# Patient Record
Sex: Male | Born: 1955
Health system: Southern US, Community
[De-identification: ages and names within clinical notes are randomized; demographics above are authoritative.]

## PROBLEM LIST (undated history)

## (undated) DIAGNOSIS — Z972 Presence of dental prosthetic device (complete) (partial): Secondary | ICD-10-CM

## (undated) DIAGNOSIS — I10 Essential (primary) hypertension: Secondary | ICD-10-CM

## (undated) DIAGNOSIS — M1990 Unspecified osteoarthritis, unspecified site: Secondary | ICD-10-CM

## (undated) DIAGNOSIS — E119 Type 2 diabetes mellitus without complications: Secondary | ICD-10-CM

## (undated) DIAGNOSIS — E785 Hyperlipidemia, unspecified: Secondary | ICD-10-CM

## (undated) DIAGNOSIS — S22000A Wedge compression fracture of unspecified thoracic vertebra, initial encounter for closed fracture: Secondary | ICD-10-CM

## (undated) HISTORY — DX: Essential (primary) hypertension: I10

## (undated) HISTORY — DX: Type 2 diabetes mellitus without complications: E11.9

## (undated) HISTORY — PX: COLONOSCOPY: SHX174

## (undated) HISTORY — DX: Hyperlipidemia, unspecified: E78.5

## (undated) HISTORY — PX: EYE SURGERY: SHX253

## (undated) HISTORY — DX: Wedge compression fracture of unspecified thoracic vertebra, initial encounter for closed fracture: S22.000A

---

## 2006-05-23 ENCOUNTER — Ambulatory Visit: Payer: Self-pay | Admitting: Gastroenterology

## 2006-06-29 ENCOUNTER — Ambulatory Visit: Payer: Self-pay | Admitting: Gastroenterology

## 2015-06-23 ENCOUNTER — Ambulatory Visit (INDEPENDENT_AMBULATORY_CARE_PROVIDER_SITE_OTHER): Payer: BLUE CROSS/BLUE SHIELD | Admitting: Family Medicine

## 2015-06-23 ENCOUNTER — Encounter: Payer: Self-pay | Admitting: Family Medicine

## 2015-06-23 VITALS — BP 120/70 | HR 100 | Temp 98.7°F | Resp 16 | Ht 65.0 in | Wt 146.5 lb

## 2015-06-23 DIAGNOSIS — H5440 Blindness, one eye, unspecified eye: Secondary | ICD-10-CM | POA: Insufficient documentation

## 2015-06-23 DIAGNOSIS — IMO0002 Reserved for concepts with insufficient information to code with codable children: Secondary | ICD-10-CM | POA: Insufficient documentation

## 2015-06-23 DIAGNOSIS — E1165 Type 2 diabetes mellitus with hyperglycemia: Secondary | ICD-10-CM | POA: Insufficient documentation

## 2015-06-23 DIAGNOSIS — E119 Type 2 diabetes mellitus without complications: Secondary | ICD-10-CM

## 2015-06-23 DIAGNOSIS — I152 Hypertension secondary to endocrine disorders: Secondary | ICD-10-CM | POA: Insufficient documentation

## 2015-06-23 DIAGNOSIS — H5442 Blindness, left eye, normal vision right eye: Secondary | ICD-10-CM | POA: Diagnosis not present

## 2015-06-23 DIAGNOSIS — E785 Hyperlipidemia, unspecified: Secondary | ICD-10-CM | POA: Insufficient documentation

## 2015-06-23 DIAGNOSIS — I10 Essential (primary) hypertension: Secondary | ICD-10-CM | POA: Diagnosis not present

## 2015-06-23 DIAGNOSIS — E1159 Type 2 diabetes mellitus with other circulatory complications: Secondary | ICD-10-CM | POA: Insufficient documentation

## 2015-06-23 LAB — POCT UA - MICROALBUMIN: Microalbumin Ur, POC: 0 mg/L

## 2015-06-23 LAB — POCT GLYCOSYLATED HEMOGLOBIN (HGB A1C): Hemoglobin A1C: 6.5

## 2015-06-23 LAB — GLUCOSE, POCT (MANUAL RESULT ENTRY): POC Glucose: 85 mg/dl (ref 70–99)

## 2015-06-23 NOTE — Progress Notes (Signed)
Name: John Howard   MRN: [id number]    DOB: January 27, 1956   Date:06/23/2015       Progress Note  Subjective  Chief Complaint  Chief Complaint  Patient presents with  . Diabetes  . Hypertension  . Hyperlipidemia    Diabetes He presents for his follow-up diabetic visit. He has type 2 diabetes mellitus. His disease course has been improving. There are no hypoglycemic associated symptoms. Pertinent negatives for hypoglycemia include no dizziness, headaches, nervousness/anxiousness, seizures or tremors. Pertinent negatives for diabetes include no blurred vision, no chest pain, no weakness and no weight loss. Risk factors for coronary artery disease include diabetes mellitus, dyslipidemia, hypertension, male sex, sedentary lifestyle and tobacco exposure. Current diabetic treatment includes oral agent (monotherapy) and insulin injections. He is compliant with treatment all of the time. His weight is decreasing steadily. He is following a diabetic diet. His home blood glucose trend is increasing steadily. His breakfast blood glucose range is generally 70-90 mg/dl.  Hypertension This is a chronic problem. The current episode started more than 1 year ago. The problem is unchanged. The problem is controlled. Pertinent negatives include no blurred vision, chest pain, headaches, neck pain, orthopnea, palpitations or shortness of breath. There are no associated agents to hypertension. Past treatments include ACE inhibitors. There are no compliance problems.   Hyperlipidemia This is a chronic problem. The current episode started more than 1 year ago. The problem is controlled. Recent lipid tests were reviewed and are normal. Pertinent negatives include no chest pain, focal weakness, myalgias or shortness of breath. Current antihyperlipidemic treatment includes statins. There are no compliance problems.    Blindness of left eye subjective patient suffered an injury to his left and she was  subsequently Continue   Past Medical History  Diagnosis Date  . Hypertension   . Hyperlipidemia   . Depression     History  Substance Use Topics  . Smoking status: Current Some Day Smoker  . Smokeless tobacco: Never Used  . Alcohol Use: 0.0 oz/week    0 Standard drinks or equivalent per week     Current outpatient prescriptions:  .  BAYER CONTOUR TEST test strip, , Disp: , Rfl: 1 .  LEVEMIR FLEXTOUCH 100 UNIT/ML Pen, , Disp: , Rfl: 0 .  lisinopril (PRINIVIL,ZESTRIL) 20 MG tablet, , Disp: , Rfl: 0 .  lovastatin (MEVACOR) 20 MG tablet, , Disp: , Rfl: 0 .  metFORMIN (GLUCOPHAGE) 500 MG tablet, , Disp: , Rfl: 0 .  NOVOFINE 32G X 6 MM MISC, , Disp: , Rfl: 0  No Known Allergies  Review of Systems  Constitutional: Negative for fever, chills and weight loss.  HENT: Negative for congestion, hearing loss, sore throat and tinnitus.   Eyes: Negative for blurred vision, double vision and redness.  Respiratory: Negative for cough, hemoptysis and shortness of breath.   Cardiovascular: Negative for chest pain, palpitations, orthopnea, claudication and leg swelling.  Gastrointestinal: Negative for heartburn, nausea, vomiting, diarrhea, constipation and blood in stool.  Genitourinary: Negative for dysuria, urgency, frequency and hematuria.  Musculoskeletal: Negative for myalgias, back pain, joint pain, falls and neck pain.  Skin: Negative for itching.  Neurological: Negative for dizziness, tingling, tremors, focal weakness, seizures, loss of consciousness, weakness and headaches.  Endo/Heme/Allergies: Does not bruise/bleed easily.  Psychiatric/Behavioral: Negative for depression and substance abuse. The patient is not nervous/anxious and does not have insomnia.      Objective  Filed Vitals:   06/23/15 0803  BP: 120/70  Pulse: 100  Temp: 98.7 F (37.1 C)  TempSrc: Oral  Resp: 16  Height:  (1.651 m)  Weight: 146 lb 8 oz (66.452 kg)  SpO2: 96%     Physical Exam   Constitutional: He is oriented to person, place, and time and well-developed, well-nourished, and in no distress.  HENT:  Head: Normocephalic.  Eyes:   Left eye is blind  Neck: Normal range of motion. Neck supple. No thyromegaly present.  Cardiovascular: Normal rate, regular rhythm and normal heart sounds.   No murmur heard. Pulmonary/Chest: Effort normal and breath sounds normal. No respiratory distress. He has no wheezes.  Abdominal: Soft. Bowel sounds are normal.  Musculoskeletal: Normal range of motion. He exhibits no edema.  Lymphadenopathy:    He has no cervical adenopathy.  Neurological: He is alert and oriented to person, place, and time. No cranial nerve deficit. Gait normal. Coordination normal.  Skin: Skin is warm and dry. No rash noted.  Psychiatric: Affect and judgment normal.      Assessment & Plan  1. Type 2 diabetes mellitus without complication Remains well controlled well-controlled - POCT HgB A1C - POCT Glucose (CBG) - POCT UA - Microalbumin - Comprehensive metabolic panel  2. Essential hypertension - TSH  3. Hyperlipidemia Recheck labs today - Lipid panel  4. Blind left eye A long-standing since childhood and being followed by ophthalmologist

## 2015-06-24 LAB — LIPID PANEL
Chol/HDL Ratio: 2.3 ratio units (ref 0.0–5.0)
Cholesterol, Total: 131 mg/dL (ref 100–199)
HDL: 56 mg/dL (ref >39)
LDL Calculated: 63 mg/dL (ref 0–99)
Triglycerides: 60 mg/dL (ref 0–149)
VLDL Cholesterol Cal: 12 mg/dL (ref 5–40)

## 2015-06-24 LAB — COMPREHENSIVE METABOLIC PANEL
ALT: 21 IU/L (ref 0–44)
AST: 23 IU/L (ref 0–40)
Albumin/Globulin Ratio: 1.8 (ref 1.1–2.5)
Albumin: 4.8 g/dL (ref 3.5–5.5)
Alkaline Phosphatase: 61 IU/L (ref 39–117)
BUN/Creatinine Ratio: 12 (ref 9–20)
BUN: 13 mg/dL (ref 6–24)
Bilirubin Total: 0.4 mg/dL (ref 0.0–1.2)
CO2: 23 mmol/L (ref 18–29)
Calcium: 9.6 mg/dL (ref 8.7–10.2)
Chloride: 99 mmol/L (ref 97–108)
Creatinine, Ser: 1.07 mg/dL (ref 0.76–1.27)
GFR calc Af Amer: 88 mL/min/{1.73_m2} (ref >59)
GFR calc non Af Amer: 76 mL/min/{1.73_m2} (ref >59)
Globulin, Total: 2.6 g/dL (ref 1.5–4.5)
Glucose: 87 mg/dL (ref 65–99)
Potassium: 4.7 mmol/L (ref 3.5–5.2)
Sodium: 139 mmol/L (ref 134–144)
Total Protein: 7.4 g/dL (ref 6.0–8.5)

## 2015-06-24 LAB — TSH: TSH: 1.84 u[IU]/mL (ref 0.450–4.500)

## 2015-09-04 ENCOUNTER — Other Ambulatory Visit: Payer: Self-pay | Admitting: Family Medicine

## 2015-09-14 ENCOUNTER — Other Ambulatory Visit: Payer: Self-pay | Admitting: Family Medicine

## 2015-09-23 ENCOUNTER — Encounter: Payer: Self-pay | Admitting: Family Medicine

## 2015-09-23 ENCOUNTER — Ambulatory Visit (INDEPENDENT_AMBULATORY_CARE_PROVIDER_SITE_OTHER): Payer: BLUE CROSS/BLUE SHIELD | Admitting: Family Medicine

## 2015-09-23 VITALS — BP 120/80 | HR 76 | Temp 98.5°F | Resp 16 | Ht 65.0 in | Wt 145.5 lb

## 2015-09-23 DIAGNOSIS — E785 Hyperlipidemia, unspecified: Secondary | ICD-10-CM | POA: Diagnosis not present

## 2015-09-23 DIAGNOSIS — E119 Type 2 diabetes mellitus without complications: Secondary | ICD-10-CM

## 2015-09-23 DIAGNOSIS — I10 Essential (primary) hypertension: Secondary | ICD-10-CM | POA: Diagnosis not present

## 2015-09-23 DIAGNOSIS — Z23 Encounter for immunization: Secondary | ICD-10-CM | POA: Diagnosis not present

## 2015-09-23 MED ORDER — LOVASTATIN 20 MG PO TABS: 20.0000 mg | ORAL_TABLET | Freq: Every day | ORAL | Status: DC

## 2015-09-23 NOTE — Progress Notes (Signed)
Name: John Howard   MRN: [id number]    DOB: [birth year]   Date:09/23/2015       Progress Note  Subjective  Chief Complaint  Chief Complaint  Patient presents with  . Hypertension    4 month follow up  . Diabetes  . Hyperlipidemia    HPI  Diabetes  Patient presents for follow-up of diabetes which is present for over 5 years. Is currently on a regimen of levemer. Patient states compliant with their diet and exercise. There's been no hypoglycemic episodes and there no polyuria polydipsia polyphagia. His average fasting glucoses been in the low around 80-120 with a high around 120 . There is no end organ disease.  Last diabetic eye exam was less than one year ago.   Last visit with dietitian was over 1 year ago. Last microalbumin was obtained earlier this year .   Hyperlipidemia  Patient has a history of hyperlipidemia for over 5 years.  Current medical regimen consist of lovastatin 20 mg daily at bedtime .  Compliance is good .  Diet and exercise are currently followed low-fat .  Risk factors for cardiovascular disease include hyperlipidemia diabetes .   There have been no side effects from the medication.    Hypertension   Patient presents for follow-up of hypertension. It has been present for over 5 years.  Patient states that there is compliance with medical regimen which consists of lisinopril 20 mg daily . There is no end organ disease. Cardiac risk factors include hypertension hyperlipidemia and diabetes.  Exercise regimen consist of walking .  Diet consist of restricted salt .    Past Medical History  Diagnosis Date  . Hypertension   . Hyperlipidemia   . Depression     Social History  Substance Use Topics  . Smoking status: Current Some Day Smoker  . Smokeless tobacco: Never Used  . Alcohol Use: 0.0 oz/week    0 Standard drinks or equivalent per week     Current outpatient prescriptions:  .  BAYER CONTOUR TEST test strip, TEST twice a day, Disp: 180 each, Rfl:  3 .  LEVEMIR FLEXTOUCH 100 UNIT/ML Pen, , Disp: , Rfl: 0 .  lisinopril (PRINIVIL,ZESTRIL) 20 MG tablet, , Disp: , Rfl: 0 .  lovastatin (MEVACOR) 20 MG tablet, , Disp: , Rfl: 0 .  metFORMIN (GLUCOPHAGE) 500 MG tablet, , Disp: , Rfl: 0 .  MICROLET LANCETS MISC, TEST BLOOD SUGAR twice a day or as directed by prescriber, Disp: 100 each, Rfl: 11 .  NOVOFINE 32G X 6 MM MISC, use 1 twice a day, Disp: 100 each, Rfl: 3  No Known Allergies  Review of Systems  Constitutional: Negative for fever, chills and weight loss.  HENT: Negative for congestion, hearing loss, sore throat and tinnitus.   Eyes: Negative for blurred vision, double vision and redness.       Blindness of left eye from traumatic injury  Respiratory: Negative for cough, hemoptysis and shortness of breath.   Cardiovascular: Negative for chest pain, palpitations, orthopnea, claudication and leg swelling.  Gastrointestinal: Negative for heartburn, nausea, vomiting, diarrhea, constipation and blood in stool.  Genitourinary: Negative for dysuria, urgency, frequency and hematuria.  Musculoskeletal: Negative for myalgias, back pain, joint pain, falls and neck pain.  Skin: Negative for itching.  Neurological: Negative for dizziness, tingling, tremors, focal weakness, seizures, loss of consciousness, weakness and headaches.  Endo/Heme/Allergies: Does not bruise/bleed easily.  Psychiatric/Behavioral: Negative for depression and substance abuse. The patient is not nervous/anxious  and does not have insomnia.      Objective  Filed Vitals:   09/23/15 0804  BP: 120/80  Pulse: 76  Temp: 98.5 F (36.9 C)  TempSrc: Oral  Resp: 16  Height: 5\' 5"  (1.651 m)  Weight: 145 lb 8 oz (65.998 kg)  SpO2: 98%     Physical Exam  Constitutional: He is oriented to person, place, and time and well-developed, well-nourished, and in no distress.  HENT:  Head: Normocephalic.  Eyes: EOM are normal. Pupils are equal, round, and reactive to light.  1  left eye opacity  Neck: Normal range of motion. Neck supple. No thyromegaly present.  Cardiovascular: Normal rate, regular rhythm, normal heart sounds and intact distal pulses.   No murmur heard. Pulmonary/Chest: Effort normal and breath sounds normal. No respiratory distress. He has no wheezes.  Abdominal: Soft. Bowel sounds are normal.  Musculoskeletal: Normal range of motion. He exhibits no edema.  Lymphadenopathy:    He has no cervical adenopathy.  Neurological: He is alert and oriented to person, place, and time. No cranial nerve deficit. Gait normal. Coordination normal.  Skin: Skin is warm and dry. No rash noted.  Psychiatric: Affect and judgment normal.      Assessment & Plan  1. Need for influenza vaccination Given today - Flu Vaccine QUAD 36+ mos PF IM (Fluarix & Fluzone Quad PF)  2. Type 2 diabetes mellitus without complication, without long-term current use of insulin (HCC) Well-controlled. Discontinue metformin  3. Hyperlipidemia Well-controlled  4. Essential hypertension Well-controlled

## 2015-09-24 ENCOUNTER — Other Ambulatory Visit: Payer: Self-pay

## 2015-09-24 ENCOUNTER — Ambulatory Visit: Payer: BLUE CROSS/BLUE SHIELD | Admitting: Family Medicine

## 2015-09-24 MED ORDER — LISINOPRIL 20 MG PO TABS: 20.0000 mg | ORAL_TABLET | Freq: Every day | ORAL | Status: DC

## 2015-11-25 ENCOUNTER — Ambulatory Visit (INDEPENDENT_AMBULATORY_CARE_PROVIDER_SITE_OTHER): Payer: BLUE CROSS/BLUE SHIELD | Admitting: Family Medicine

## 2015-11-25 ENCOUNTER — Encounter: Payer: Self-pay | Admitting: Family Medicine

## 2015-11-25 VITALS — BP 138/82 | HR 64 | Temp 98.6°F | Resp 16 | Ht 65.0 in | Wt 149.1 lb

## 2015-11-25 DIAGNOSIS — H5442 Blindness, left eye, normal vision right eye: Secondary | ICD-10-CM

## 2015-11-25 DIAGNOSIS — Z794 Long term (current) use of insulin: Secondary | ICD-10-CM

## 2015-11-25 DIAGNOSIS — I10 Essential (primary) hypertension: Secondary | ICD-10-CM | POA: Diagnosis not present

## 2015-11-25 DIAGNOSIS — E1169 Type 2 diabetes mellitus with other specified complication: Secondary | ICD-10-CM | POA: Diagnosis not present

## 2015-11-25 DIAGNOSIS — E785 Hyperlipidemia, unspecified: Secondary | ICD-10-CM | POA: Diagnosis not present

## 2015-11-25 DIAGNOSIS — E119 Type 2 diabetes mellitus without complications: Secondary | ICD-10-CM

## 2015-11-25 DIAGNOSIS — E1142 Type 2 diabetes mellitus with diabetic polyneuropathy: Secondary | ICD-10-CM | POA: Insufficient documentation

## 2015-11-25 DIAGNOSIS — H5440 Blindness, one eye, unspecified eye: Secondary | ICD-10-CM

## 2015-11-25 LAB — POCT GLYCOSYLATED HEMOGLOBIN (HGB A1C): Hemoglobin A1C: 7.2

## 2015-11-25 LAB — GLUCOSE, POCT (MANUAL RESULT ENTRY): POC Glucose: 128 mg/dl — AB (ref 70–99)

## 2015-11-25 NOTE — Progress Notes (Signed)
Name: John Howard   MRN: [id number]    DOB: 09-23-1956   Date:11/25/2015       Progress Note  Subjective  Chief Complaint  Chief Complaint  Patient presents with  . Hypertension    4 month recheck  . Diabetes  . Hyperlipidemia    HPI  Diabetes  Patient presents for follow-up of diabetes which is present for over 5 years. Is currently on a regimen of Levemir . Patient states not now has regularly compliant compliant with their diet and exercise. There's been no hypoglycemic episodes and there no polyuria polydipsia polyphagia. His average fasting glucoses been in the low around low 90s with a high around low 100s fasting exhibits some dietary discretion of recent . There is no end organ disease.  Last diabetic eye exam was this year.   Last visit with dietitian was last year. Last microalbumin was obtained earlier this year she was 0 .   Hypertension   Patient presents for follow-up of hypertension. It has been present for over over 5 years.  Patient states that there is compliance with medical regimen which consists of lisinopril 20 mg daily . There is no end organ disease. Cardiac risk factors include hypertension hyperlipidemia and diabetes.  Exercise regimen consist of some walking .  Diet consist of salt restriction .  Hyperlipidemia  Patient has a history of hyperlipidemia for over 5 years.  Current medical regimen consist of lovastatin 20 mg daily at bedtime .  Compliance is good currently well .  Diet and exercise are currently followed fairly well .  Risk factors for cardiovascular disease include hyperlipidemia hypertension and diabetes .   There have been no side effects from the medication.    Past Medical History  Diagnosis Date  . Hypertension   . Hyperlipidemia   . Depression     Social History  Substance Use Topics  . Smoking status: Current Some Day Smoker  . Smokeless tobacco: Never Used  . Alcohol Use: 0.0 oz/week    0 Standard drinks or equivalent per  week     Current outpatient prescriptions:  .  BAYER CONTOUR TEST test strip, TEST twice a day, Disp: 180 each, Rfl: 3 .  LEVEMIR FLEXTOUCH 100 UNIT/ML Pen, , Disp: , Rfl: 0 .  lisinopril (PRINIVIL,ZESTRIL) 20 MG tablet, Take 1 tablet (20 mg total) by mouth daily., Disp: 30 tablet, Rfl: 3 .  lovastatin (MEVACOR) 20 MG tablet, Take 1 tablet (20 mg total) by mouth at bedtime., Disp: 30 tablet, Rfl: 5 .  MICROLET LANCETS MISC, TEST BLOOD SUGAR twice a day or as directed by prescriber, Disp: 100 each, Rfl: 11 .  NOVOFINE 32G X 6 MM MISC, use 1 twice a day, Disp: 100 each, Rfl: 3  No Known Allergies  Review of Systems  Constitutional: Negative for fever, chills and weight loss.  HENT: Negative for congestion, hearing loss, sore throat and tinnitus.        Plan left eye  Eyes: Negative for blurred vision, double vision and redness.       Plan left eye  Respiratory: Negative for cough, hemoptysis and shortness of breath.   Cardiovascular: Negative for chest pain, palpitations, orthopnea, claudication and leg swelling.  Gastrointestinal: Negative for heartburn, nausea, vomiting, diarrhea, constipation and blood in stool.  Genitourinary: Negative for dysuria, urgency, frequency and hematuria.  Musculoskeletal: Negative for myalgias, back pain, joint pain, falls and neck pain.  Skin: Negative for itching.  Neurological: Negative for dizziness, tingling,  tremors, focal weakness, seizures, loss of consciousness, weakness and headaches.  Endo/Heme/Allergies: Does not bruise/bleed easily.  Psychiatric/Behavioral: Negative for depression and substance abuse. The patient is not nervous/anxious and does not have insomnia.      Objective  Filed Vitals:   11/25/15 0742  BP: 138/82  Pulse: 64  Temp: 98.6 F (37 C)  TempSrc: Oral  Resp: 16  Height: 5\' 5"  (1.651 m)  Weight: 149 lb 1.6 oz (67.631 kg)  SpO2: 98%     Physical Exam  Constitutional: He is oriented to person, place, and time and  well-developed, well-nourished, and in no distress.  HENT:  Head: Normocephalic.  Eyes: EOM are normal. Pupils are equal, round, and reactive to light.  Blind left eye  Neck: Normal range of motion. Neck supple. No thyromegaly present.  Cardiovascular: Normal rate, regular rhythm and normal heart sounds.   No murmur heard. Pulmonary/Chest: Effort normal and breath sounds normal. No respiratory distress. He has no wheezes.  Abdominal: Soft. Bowel sounds are normal.  Musculoskeletal: Normal range of motion. He exhibits no edema.  Lymphadenopathy:    He has no cervical adenopathy.  Neurological: He is alert and oriented to person, place, and time. No cranial nerve deficit. Gait normal. Coordination normal.  Skin: Skin is warm and dry. No rash noted.  Psychiatric: Affect and judgment normal.      Assessment & Plan   1. Type 2 diabetes mellitus with other specified complication (HCC) Encourage compliance with diet and exercises in class and increase his Levemir from 2142 units daily - POCT HgB A1C - POCT Glucose (CBG)  2. Essential hypertension Not at goal 130/80  3. Type 2 diabetes mellitus without complication, with long-term current use of insulin (HCC)   4. Hyperlipidemia Labs on return visit  5. Blind left eye He is to see ophthalmologist in the future

## 2015-12-24 ENCOUNTER — Encounter: Payer: BLUE CROSS/BLUE SHIELD | Admitting: Family Medicine

## 2016-01-13 ENCOUNTER — Other Ambulatory Visit: Payer: Self-pay | Admitting: Family Medicine

## 2016-01-19 ENCOUNTER — Other Ambulatory Visit: Payer: Self-pay | Admitting: Family Medicine

## 2016-01-19 MED ORDER — LEVEMIR FLEXTOUCH 100 UNIT/ML ~~LOC~~ SOPN: 22.0000 [IU] | PEN_INJECTOR | Freq: Every day | SUBCUTANEOUS | Status: DC

## 2016-02-04 ENCOUNTER — Encounter: Payer: BLUE CROSS/BLUE SHIELD | Admitting: Family Medicine

## 2016-02-22 ENCOUNTER — Encounter: Payer: Self-pay | Admitting: Family Medicine

## 2016-02-22 ENCOUNTER — Ambulatory Visit (INDEPENDENT_AMBULATORY_CARE_PROVIDER_SITE_OTHER): Payer: BLUE CROSS/BLUE SHIELD | Admitting: Family Medicine

## 2016-02-22 VITALS — BP 138/70 | HR 69 | Temp 98.8°F | Resp 15 | Ht 65.0 in | Wt 150.1 lb

## 2016-02-22 DIAGNOSIS — Z0000 Encounter for general adult medical examination without abnormal findings: Secondary | ICD-10-CM | POA: Insufficient documentation

## 2016-02-22 NOTE — Progress Notes (Signed)
Name: John Howard   MRN: [id number]    DOB: 04-11-1956   Date:02/22/2016       Progress Note  Subjective  Chief Complaint  Chief Complaint  Patient presents with  . Annual Exam    CPE    HPI  Pt. Is here for a Complete Physical Exam.  Last colonoscopy was reportedly 10 years ago, no records available but got a reminder from his [employer] that he is due for colonoscopy.   Past Medical History  Diagnosis Date  . Hypertension   . Hyperlipidemia   . Diabetes mellitus without complication [facility])     Past Surgical History  Procedure Laterality Date  . Eye surgery      Family History  Problem Relation Age of Onset  . Hypertension Mother   . Diabetes Mother     Social History   Social History  . Marital Status: Married    Spouse Name: N/A  . Number of Children: N/A  . Years of Education: N/A   Occupational History  . Not on file.   Social History Main Topics  . Smoking status: Current Some Day Smoker -- 0.00 packs/day    Types: Cigarettes  . Smokeless tobacco: Never Used  . Alcohol Use: 0.0 oz/week    0 Standard drinks or equivalent per week  . Drug Use: No  . Sexual Activity:    Partners: Female   Other Topics Concern  . Not on file   Social History Narrative     Current outpatient prescriptions:  .  BAYER CONTOUR TEST test strip, TEST twice a day, Disp: 180 each, Rfl: 3 .  LEVEMIR FLEXTOUCH 100 UNIT/ML Pen, Inject 22 Units into the skin daily at 10 pm., Disp: 15 mL, Rfl: 5 .  lisinopril (PRINIVIL,ZESTRIL) 20 MG tablet, take 1 tablet by mouth once daily, Disp: 30 tablet, Rfl: 3 .  lovastatin (MEVACOR) 20 MG tablet, Take 1 tablet (20 mg total) by mouth at bedtime., Disp: 30 tablet, Rfl: 5 .  MICROLET LANCETS MISC, TEST BLOOD SUGAR twice a day or as directed by prescriber, Disp: 100 each, Rfl: 11 .  NOVOFINE 32G X 6 MM MISC, use 1 twice a day, Disp: 100 each, Rfl: 3  No Known Allergies   Review of Systems  Constitutional: Negative for  fever, chills and weight loss.  HENT: Negative for sore throat.   Eyes: Negative for blurred vision and double vision.  Respiratory: Negative for cough and shortness of breath.   Cardiovascular: Negative for chest pain and palpitations.  Gastrointestinal: Negative for nausea, vomiting, abdominal pain and blood in stool.  Genitourinary: Negative for dysuria and hematuria.  Musculoskeletal: Negative for back pain and joint pain.  Neurological: Negative for dizziness and headaches.  Psychiatric/Behavioral: Negative for depression. The patient is not nervous/anxious.       Objective  Filed Vitals:   02/22/16 1110  BP: 138/70  Pulse: 69  Temp: 98.8 F (37.1 C)  TempSrc: Oral  Resp: 15  Height:  (1.651 m)  Weight: 150 lb 1.6 oz (68.085 kg)  SpO2: 98%    Physical Exam  Constitutional: He is oriented to person, place, and time and well-developed, well-nourished, and in no distress.  HENT:  Head: Normocephalic and atraumatic.  Eyes:  Left eye injury, yellowish irregular area on the iris, no definitive pupil visualized.  Neck: Normal range of motion.  Cardiovascular: Normal rate and regular rhythm.   Pulmonary/Chest: Effort normal and breath sounds normal.  Abdominal: Soft.  Bowel sounds are normal.  Genitourinary: Prostate normal. Rectal exam shows external hemorrhoid. Prostate is not tender.  Musculoskeletal:       Right ankle: He exhibits no swelling.       Left ankle: He exhibits no swelling.  Neurological: He is alert and oriented to person, place, and time.  Nursing note and vitals reviewed.       Assessment & Plan  1. Annual physical exam EKG NSR. Reviewed and reassured.  Age appropriate laboratory studies obtained. Referral to GI for colonoscopy. - Lipid Profile - CBC with Differential - Comprehensive Metabolic Panel (CMET) - TSH - PSA - Vitamin D (25 hydroxy) - Ambulatory referral to Gastroenterology - EKG 12-Lead    Kaylla Cobos Asad A. Faylene KurtzShah [facility] 02/22/2016 11:37 AM

## 2016-02-23 LAB — LIPID PANEL
Chol/HDL Ratio: 2.2 ratio units (ref 0.0–5.0)
Cholesterol, Total: 132 mg/dL (ref 100–199)
HDL: 59 mg/dL (ref >39)
LDL Calculated: 63 mg/dL (ref 0–99)
Triglycerides: 51 mg/dL (ref 0–149)
VLDL Cholesterol Cal: 10 mg/dL (ref 5–40)

## 2016-02-23 LAB — CBC WITH DIFFERENTIAL/PLATELET
Basophils Absolute: 0 10*3/uL (ref 0.0–0.2)
Basos: 0 %
EOS (ABSOLUTE): 0.1 10*3/uL (ref 0.0–0.4)
Eos: 2 %
Hematocrit: 44.3 % (ref 37.5–51.0)
Hemoglobin: 15 g/dL (ref 12.6–17.7)
Immature Grans (Abs): 0 10*3/uL (ref 0.0–0.1)
Immature Granulocytes: 0 %
Lymphocytes Absolute: 2.2 10*3/uL (ref 0.7–3.1)
Lymphs: 36 %
MCH: 31 pg (ref 26.6–33.0)
MCHC: 33.9 g/dL (ref 31.5–35.7)
MCV: 92 fL (ref 79–97)
Monocytes Absolute: 0.5 10*3/uL (ref 0.1–0.9)
Monocytes: 9 %
Neutrophils Absolute: 3.2 10*3/uL (ref 1.4–7.0)
Neutrophils: 53 %
Platelets: 233 10*3/uL (ref 150–379)
RBC: 4.84 x10E6/uL (ref 4.14–5.80)
RDW: 13.7 % (ref 12.3–15.4)
WBC: 6.1 10*3/uL (ref 3.4–10.8)

## 2016-02-23 LAB — COMPREHENSIVE METABOLIC PANEL
ALT: 17 IU/L (ref 0–44)
AST: 19 IU/L (ref 0–40)
Albumin/Globulin Ratio: 1.8 (ref 1.2–2.2)
Albumin: 4.5 g/dL (ref 3.5–5.5)
Alkaline Phosphatase: 57 IU/L (ref 39–117)
BUN/Creatinine Ratio: 9 (ref 9–20)
BUN: 8 mg/dL (ref 6–24)
Bilirubin Total: 0.3 mg/dL (ref 0.0–1.2)
CO2: 25 mmol/L (ref 18–29)
Calcium: 9.4 mg/dL (ref 8.7–10.2)
Chloride: 102 mmol/L (ref 96–106)
Creatinine, Ser: 0.85 mg/dL (ref 0.76–1.27)
GFR calc Af Amer: 110 mL/min/{1.73_m2} (ref >59)
GFR calc non Af Amer: 95 mL/min/{1.73_m2} (ref >59)
Globulin, Total: 2.5 g/dL (ref 1.5–4.5)
Glucose: 125 mg/dL — ABNORMAL HIGH (ref 65–99)
Potassium: 4.3 mmol/L (ref 3.5–5.2)
Sodium: 143 mmol/L (ref 134–144)
Total Protein: 7 g/dL (ref 6.0–8.5)

## 2016-02-23 LAB — PSA: Prostate Specific Ag, Serum: 1.1 ng/mL (ref 0.0–4.0)

## 2016-02-23 LAB — TSH: TSH: 2.09 u[IU]/mL (ref 0.450–4.500)

## 2016-02-23 LAB — VITAMIN D 25 HYDROXY (VIT D DEFICIENCY, FRACTURES): Vit D, 25-Hydroxy: 15.3 ng/mL — ABNORMAL LOW (ref 30.0–100.0)

## 2016-02-25 ENCOUNTER — Telehealth: Payer: Self-pay

## 2016-02-25 MED ORDER — VITAMIN D (ERGOCALCIFEROL) 1.25 MG (50000 UNIT) PO CAPS: 50000.0000 [IU] | ORAL_CAPSULE | ORAL | Status: DC

## 2016-02-25 NOTE — Telephone Encounter (Signed)
Lab results have been reported to patient and a prescription for Vitamin D3 50,000 units has been sent to [facility] per Dr. Sherryll BurgerShah and he is to take 1 capsule once a week for 12 weeks, patient verbalized understanding

## 2016-03-02 ENCOUNTER — Telehealth: Payer: Self-pay | Admitting: Gastroenterology

## 2016-03-02 NOTE — Telephone Encounter (Signed)
Patient is returning your call regarding a colonoscopy °

## 2016-03-03 ENCOUNTER — Telehealth: Payer: Self-pay | Admitting: Gastroenterology

## 2016-03-03 NOTE — Telephone Encounter (Signed)
Patient has called back to do a colonoscopy triage. Patient was made aware that he should have a return call back for a triage within 2-3 weeks.

## 2016-03-04 NOTE — Telephone Encounter (Signed)
Call back for triage

## 2016-03-09 ENCOUNTER — Other Ambulatory Visit: Payer: Self-pay

## 2016-03-09 ENCOUNTER — Telehealth: Payer: Self-pay | Admitting: Gastroenterology

## 2016-03-09 NOTE — Telephone Encounter (Signed)
Gastroenterology Pre-Procedure Review  Request Date: 03/21/16 Requesting Physician: Dr. Thana AtesMorrisey  PATIENT REVIEW QUESTIONS: The patient responded to the following health history questions as indicated:    1. Are you having any GI issues? no 2. Do you have a personal history of Polyps? no 3. Do you have a family history of Colon Cancer or Polyps? no 4. Diabetes Mellitus? yes (Type 2) 5. Joint replacements in the past 12 months?no 6. Major health problems in the past 3 months?no 7. Any artificial heart valves, MVP, or defibrillator?no    MEDICATIONS & ALLERGIES:    Patient reports the following regarding taking any anticoagulation/antiplatelet therapy:   Plavix, Coumadin, Eliquis, Xarelto, Lovenox, Pradaxa, Brilinta, or Effient? no Aspirin? yes (ASA 81mg )  Patient confirms/reports the following medications:  Current Outpatient Prescriptions  Medication Sig Dispense Refill  . BAYER CONTOUR TEST test strip TEST twice a day 180 each 3  . LEVEMIR FLEXTOUCH 100 UNIT/ML Pen Inject 22 Units into the skin daily at 10 pm. 15 mL 5  . lisinopril (PRINIVIL,ZESTRIL) 20 MG tablet take 1 tablet by mouth once daily 30 tablet 3  . lovastatin (MEVACOR) 20 MG tablet Take 1 tablet (20 mg total) by mouth at bedtime. 30 tablet 5  . MICROLET LANCETS MISC TEST BLOOD SUGAR twice a day or as directed by prescriber 100 each 11  . NOVOFINE 32G X 6 MM MISC use 1 twice a day 100 each 3  . Vitamin D, Ergocalciferol, (DRISDOL) 50000 units CAPS capsule Take 1 capsule (50,000 Units total) by mouth once a week. For 12 weeks 12 capsule 0   No current facility-administered medications for this visit.    Patient confirms/reports the following allergies:  No Known Allergies  No orders of the defined types were placed in this encounter.    AUTHORIZATION INFORMATION Primary Insurance: 1D#: Group #:  Secondary Insurance: 1D#: Group #:  SCHEDULE INFORMATION: Date: 03/21/16 Time: Location: [facility]

## 2016-03-09 NOTE — Telephone Encounter (Signed)
[facility]. Instructs/rx mailed. Please see if precert is required.

## 2016-03-09 NOTE — Telephone Encounter (Signed)
Or [phone] Patient wife said she has been trying to get in touch with you regarding [facility]

## 2016-03-12 ENCOUNTER — Other Ambulatory Visit: Payer: Self-pay | Admitting: Family Medicine

## 2016-03-14 NOTE — Telephone Encounter (Signed)
Pt scheduled for screening colonoscopy on 03/21/16. Instructs/rx mailed. Please precert.

## 2016-03-15 ENCOUNTER — Encounter: Payer: Self-pay | Admitting: *Deleted

## 2016-03-18 NOTE — Discharge Instructions (Signed)

## 2016-03-21 ENCOUNTER — Ambulatory Visit
Admission: RE | Admit: 2016-03-21 | Discharge: 2016-03-21 | Disposition: A | Discharge status: Home / Self Care | Payer: BLUE CROSS/BLUE SHIELD | Source: Ambulatory Visit | Attending: Gastroenterology | Admitting: Gastroenterology

## 2016-03-21 ENCOUNTER — Ambulatory Visit: Payer: BLUE CROSS/BLUE SHIELD | Admitting: Anesthesiology

## 2016-03-21 ENCOUNTER — Encounter
Admission: RE | Disposition: A | Discharge status: Home / Self Care | Payer: Self-pay | Source: Ambulatory Visit | Attending: Gastroenterology

## 2016-03-21 DIAGNOSIS — E119 Type 2 diabetes mellitus without complications: Secondary | ICD-10-CM | POA: Diagnosis not present

## 2016-03-21 DIAGNOSIS — Z8249 Family history of ischemic heart disease and other diseases of the circulatory system: Secondary | ICD-10-CM | POA: Insufficient documentation

## 2016-03-21 DIAGNOSIS — E785 Hyperlipidemia, unspecified: Secondary | ICD-10-CM | POA: Diagnosis not present

## 2016-03-21 DIAGNOSIS — Z794 Long term (current) use of insulin: Secondary | ICD-10-CM | POA: Insufficient documentation

## 2016-03-21 DIAGNOSIS — Z1211 Encounter for screening for malignant neoplasm of colon: Secondary | ICD-10-CM | POA: Insufficient documentation

## 2016-03-21 DIAGNOSIS — F17210 Nicotine dependence, cigarettes, uncomplicated: Secondary | ICD-10-CM | POA: Insufficient documentation

## 2016-03-21 DIAGNOSIS — K641 Second degree hemorrhoids: Secondary | ICD-10-CM | POA: Insufficient documentation

## 2016-03-21 DIAGNOSIS — M19049 Primary osteoarthritis, unspecified hand: Secondary | ICD-10-CM | POA: Diagnosis not present

## 2016-03-21 DIAGNOSIS — K5730 Diverticulosis of large intestine without perforation or abscess without bleeding: Secondary | ICD-10-CM | POA: Insufficient documentation

## 2016-03-21 DIAGNOSIS — Z7982 Long term (current) use of aspirin: Secondary | ICD-10-CM | POA: Diagnosis not present

## 2016-03-21 DIAGNOSIS — I10 Essential (primary) hypertension: Secondary | ICD-10-CM | POA: Diagnosis not present

## 2016-03-21 DIAGNOSIS — Z833 Family history of diabetes mellitus: Secondary | ICD-10-CM | POA: Insufficient documentation

## 2016-03-21 HISTORY — PX: COLONOSCOPY WITH PROPOFOL: SHX5780

## 2016-03-21 HISTORY — DX: Presence of dental prosthetic device (complete) (partial): Z97.2

## 2016-03-21 HISTORY — DX: Unspecified osteoarthritis, unspecified site: M19.90

## 2016-03-21 LAB — GLUCOSE, CAPILLARY
Glucose-Capillary: 169 mg/dL — ABNORMAL HIGH (ref 65–99)
Glucose-Capillary: 156 mg/dL — ABNORMAL HIGH (ref 65–99)

## 2016-03-21 SURGERY — COLONOSCOPY WITH PROPOFOL
Anesthesia: Monitor Anesthesia Care | Wound class: Contaminated

## 2016-03-21 MED ORDER — PROPOFOL 10 MG/ML IV BOLUS
INTRAVENOUS | Status: DC | PRN
  Administered 2016-03-21 (×2): 40 mg via INTRAVENOUS
  Administered 2016-03-21: 80 mg via INTRAVENOUS
  Administered 2016-03-21: 40 mg via INTRAVENOUS

## 2016-03-21 MED ORDER — LACTATED RINGERS IV SOLN
INTRAVENOUS | Status: DC
  Administered 2016-03-21: 08:00:00 via INTRAVENOUS

## 2016-03-21 MED ORDER — LIDOCAINE HCL (CARDIAC) 20 MG/ML IV SOLN
INTRAVENOUS | Status: DC | PRN
  Administered 2016-03-21: 20 mg via INTRAVENOUS

## 2016-03-21 MED ORDER — ACETAMINOPHEN 325 MG PO TABS: 325.0000 mg | ORAL_TABLET | ORAL | Status: DC | PRN

## 2016-03-21 MED ORDER — STERILE WATER FOR IRRIGATION IR SOLN
Status: DC | PRN
  Administered 2016-03-21: 08:00:00

## 2016-03-21 MED ORDER — ACETAMINOPHEN 160 MG/5ML PO SOLN: 325.0000 mg | ORAL | Status: DC | PRN

## 2016-03-21 SURGICAL SUPPLY — 22 items
CANISTER SUCT 1200ML W/VALVE (MISCELLANEOUS) ×2 IMPLANT
CLIP HMST 235XBRD CATH ROT (MISCELLANEOUS) IMPLANT
CLIP RESOLUTION 360 11X235 (MISCELLANEOUS)
FCP ESCP3.2XJMB 240X2.8X (MISCELLANEOUS)
FORCEPS BIOP RAD 4 LRG CAP 4 (CUTTING FORCEPS) IMPLANT
FORCEPS BIOP RJ4 240 W/NDL (MISCELLANEOUS)
FORCEPS ESCP3.2XJMB 240X2.8X (MISCELLANEOUS) IMPLANT
GOWN CVR UNV OPN BCK APRN NK (MISCELLANEOUS) ×2 IMPLANT
GOWN ISOL THUMB LOOP REG UNIV (MISCELLANEOUS) ×2
INJECTOR VARIJECT VIN23 (MISCELLANEOUS) IMPLANT
KIT DEFENDO VALVE AND CONN (KITS) IMPLANT
KIT ENDO PROCEDURE OLY (KITS) ×2 IMPLANT
MARKER SPOT ENDO TATTOO 5ML (MISCELLANEOUS) IMPLANT
PAD GROUND ADULT SPLIT (MISCELLANEOUS) IMPLANT
PROBE APC STR FIRE (PROBE) IMPLANT
SNARE SHORT THROW 13M SML OVAL (MISCELLANEOUS) IMPLANT
SNARE SHORT THROW 30M LRG OVAL (MISCELLANEOUS) IMPLANT
SNARE SNG USE RND 15MM (INSTRUMENTS) IMPLANT
SPOT EX ENDOSCOPIC TATTOO (MISCELLANEOUS)
TRAP ETRAP POLY (MISCELLANEOUS) IMPLANT
VARIJECT INJECTOR VIN23 (MISCELLANEOUS)
WATER STERILE IRR 250ML POUR (IV SOLUTION) ×2 IMPLANT

## 2016-03-21 NOTE — H&P (Signed)
  [facility]  [address] [place], [place] [address] Phone: [phone] Fax : [phone]  Primary Care Physician:  Dennison MascotLemont Morrisey, MD Primary Gastroenterologist:  Dr. Servando SnareWohl  Pre-Procedure History & Physical: HPI:  John Howard is a 60 y.o. male is here for a screening colonoscopy.   Past Medical History  Diagnosis Date  . Hypertension   . Hyperlipidemia   . Diabetes mellitus without complication (HCC)   . Wears dentures     full upper and lower  . Arthritis     fingers    Past Surgical History  Procedure Laterality Date  . Eye surgery    . Colonoscopy      Prior to Admission medications   Medication Sig Start Date End Date Taking? Authorizing Provider  aspirin 81 MG tablet Take 81 mg by mouth daily.   Yes Historical Provider, MD  LEVEMIR FLEXTOUCH 100 UNIT/ML Pen Inject 22 Units into the skin daily at 10 pm. 01/19/16  Yes Dennison MascotLemont Morrisey, MD  lisinopril (PRINIVIL,ZESTRIL) 20 MG tablet take 1 tablet by mouth once daily 01/13/16  Yes Dennison MascotLemont Morrisey, MD  lovastatin (MEVACOR) 20 MG tablet take 1 tablet by mouth at bedtime 03/14/16  Yes Dennison MascotLemont Morrisey, MD  Vitamin D, Ergocalciferol, (DRISDOL) 50000 units CAPS capsule Take 1 capsule (50,000 Units total) by mouth once a week. For 12 weeks 02/25/16  Yes Ellyn HackSyed Asad A Shah, MD  BAYER CONTOUR TEST test strip TEST twice a day 09/07/15   Dennison MascotLemont Morrisey, MD  MICROLET LANCETS MISC TEST BLOOD SUGAR twice a day or as directed by prescriber 09/07/15   Dennison MascotLemont Morrisey, MD  NOVOFINE 32G X 6 MM MISC use 1 twice a day 09/15/15   Dennison MascotLemont Morrisey, MD    Allergies as of 03/09/2016  . (No Known Allergies)    Family History  Problem Relation Age of Onset  . Hypertension Mother   . Diabetes Mother     Social History   Social History  . Marital Status: Married    Spouse Name: N/A  . Number of Children: N/A  . Years of Education: N/A   Occupational History  . Not on file.   Social History Main Topics  .  Smoking status: Current Some Day Smoker -- 0.25 packs/day for 35 years    Types: Cigarettes  . Smokeless tobacco: Never Used  . Alcohol Use: 0.6 oz/week    0 Standard drinks or equivalent, 1 Cans of beer per week  . Drug Use: No  . Sexual Activity:    Partners: Female   Other Topics Concern  . Not on file   Social History Narrative    Review of Systems: See HPI, otherwise negative ROS  Physical Exam: BP 162/77 mmHg  Pulse 69  Temp(Src) 97.7 F (36.5 C) (Temporal)  Ht 5\' 5"  (1.651 m)  Wt 148 lb (67.132 kg)  BMI 24.63 kg/m2  SpO2 100% General:   Alert,  pleasant and cooperative in NAD Head:  Normocephalic and atraumatic. Neck:  Supple; no masses or thyromegaly. Lungs:  Clear throughout to auscultation.    Heart:  Regular rate and rhythm. Abdomen:  Soft, nontender and nondistended. Normal bowel sounds, without guarding, and without rebound.   Neurologic:  Alert and  oriented x4;  grossly normal neurologically.  Impression/Plan: John Howard is now here to undergo a screening colonoscopy.  Risks, benefits, and alternatives regarding colonoscopy have been reviewed with the patient.  Questions have been answered.  All parties agreeable.

## 2016-03-21 NOTE — Anesthesia Postprocedure Evaluation (Signed)
Anesthesia Post Note  Patient: John Howard  Procedure(s) Performed: Procedure(s) (LRB): COLONOSCOPY WITH PROPOFOL (N/A)  Patient location during evaluation: PACU Anesthesia Type: MAC Level of consciousness: awake and alert and oriented Pain management: satisfactory to patient Vital Signs Assessment: post-procedure vital signs reviewed and stable Respiratory status: spontaneous breathing, nonlabored ventilation and respiratory function stable Cardiovascular status: blood pressure returned to baseline and stable Postop Assessment: Adequate PO intake and No signs of nausea or vomiting Anesthetic complications: no    [name], [name]

## 2016-03-21 NOTE — Anesthesia Procedure Notes (Signed)
Procedure Name: MAC Performed by: Tenesha Garza Pre-anesthesia Checklist: Patient identified, Emergency Drugs available, Suction available, Patient being monitored and Timeout performed Patient Re-evaluated:Patient Re-evaluated prior to inductionOxygen Delivery Method: Nasal cannula       

## 2016-03-21 NOTE — Op Note (Signed)
[facility] Patient Name: John Howard Procedure Date: 03/21/2016 7:34 AM MRN: [id number] Account #: [id number] Date of Birth: 21-Jun-1956 Admit Type: Outpatient Age: 60 Room: [facility] OR ROOM 01 Gender: Male Note Status: Finalized Procedure:            Colonoscopy Indications:          Screening for colorectal malignant neoplasm Providers:            Midge Minium, MD Referring MD:         Dennison Mascot, MD (Referring MD) Medicines:            Propofol per Anesthesia Complications:        No immediate complications. Procedure:            Pre-Anesthesia Assessment:                       - Prior to the procedure, a History and Physical was                        performed, and patient medications and allergies were                        reviewed. The patient's tolerance of previous                        anesthesia was also reviewed. The risks and benefits of                        the procedure and the sedation options and risks were                        discussed with the patient. All questions were                        answered, and informed consent was obtained. Prior                        Anticoagulants: The patient has taken no previous                        anticoagulant or antiplatelet agents. ASA Grade                        Assessment: II - A patient with mild systemic disease.                        After reviewing the risks and benefits, the patient was                        deemed in satisfactory condition to undergo the                        procedure.                       After obtaining informed consent, the colonoscope was                        passed under direct vision. Throughout the procedure,  the patient's blood pressure, pulse, and oxygen                        saturations were monitored continuously. The Olympus CF                        H180AL colonoscope (S#: [id number]) was introduced through                         the anus and advanced to the the cecum, identified by                        appendiceal orifice and ileocecal valve. The                        colonoscopy was performed without difficulty. The                        patient tolerated the procedure well. The quality of                        the bowel preparation was excellent. Findings:      The perianal and digital rectal examinations were normal.      A few small-mouthed diverticula were found in the entire colon.      Non-bleeding internal hemorrhoids were found during retroflexion. The       hemorrhoids were Grade II (internal hemorrhoids that prolapse but reduce       spontaneously). Impression:           - Diverticulosis in the entire examined colon.                       - Non-bleeding internal hemorrhoids.                       - No specimens collected. Recommendation:       - Repeat colonoscopy in 10 years for screening purposes. Procedure Code(s):    --- Professional ---                       [phone], Colonoscopy, flexible; diagnostic, including                        collection of specimen(s) by brushing or washing, when                        performed (separate procedure) Diagnosis Code(s):    --- Professional ---                       Z12.11, Encounter for screening for malignant neoplasm                        of colon                       K64.1, Second degree hemorrhoids                       K57.30, Diverticulosis of large intestine without  perforation or abscess without bleeding CPT copyright 2016 American Medical Association. All rights reserved. The codes documented in this report are preliminary and upon coder review may  be revised to meet current compliance requirements. Midge Minium, MD 03/21/2016 8:14:58 AM This report has been signed electronically. Number of Addenda: 0 Note Initiated On: 03/21/2016 7:34 AM Scope Withdrawal Time: 0 hours 6 minutes 39 seconds   Total Procedure Duration: 0 hours 8 minutes 47 seconds       [facility]

## 2016-03-21 NOTE — Anesthesia Preprocedure Evaluation (Signed)
Anesthesia Evaluation  Patient identified by MRN, date of birth, ID band  Reviewed: Allergy & Precautions, H&P , NPO status , Patient's Chart, lab work & pertinent test results  Airway Mallampati: III  TM Distance: >3 FB Neck ROM: full    Dental  (+) Lower Dentures, Upper Dentures   Pulmonary Current Smoker,    Pulmonary exam normal        Cardiovascular hypertension,  Rhythm:regular Rate:Normal     Neuro/Psych    GI/Hepatic   Endo/Other  diabetes  Renal/GU      Musculoskeletal   Abdominal   Peds  Hematology   Anesthesia Other Findings   Reproductive/Obstetrics                             Anesthesia Physical Anesthesia Plan  ASA: II  Anesthesia Plan: MAC   Post-op Pain Management:    Induction:   Airway Management Planned:   Additional Equipment:   Intra-op Plan:   Post-operative Plan:   Informed Consent: I have reviewed the patients History and Physical, chart, labs and discussed the procedure including the risks, benefits and alternatives for the proposed anesthesia with the patient or authorized representative who has indicated his/her understanding and acceptance.     Plan Discussed with: CRNA  Anesthesia Plan Comments:         Anesthesia Quick Evaluation

## 2016-03-21 NOTE — Transfer of Care (Signed)
Immediate Anesthesia Transfer of Care Note  Patient: John MeigsDwight L Khatoon  Procedure(s) Performed: Procedure(s) with comments: COLONOSCOPY WITH PROPOFOL (N/A) - Diabetic - insulin  Patient Location: PACU  Anesthesia Type: MAC  Level of Consciousness: awake, alert  and patient cooperative  Airway and Oxygen Therapy: Patient Spontanous Breathing and Patient connected to supplemental oxygen  Post-op Assessment: Post-op Vital signs reviewed, Patient's Cardiovascular Status Stable, Respiratory Function Stable, Patent Airway and No signs of Nausea or vomiting  Post-op Vital Signs: Reviewed and stable  Complications: No apparent anesthesia complications

## 2016-03-22 ENCOUNTER — Encounter: Payer: Self-pay | Admitting: Gastroenterology

## 2016-03-28 ENCOUNTER — Ambulatory Visit: Payer: BLUE CROSS/BLUE SHIELD | Admitting: Family Medicine

## 2016-04-27 ENCOUNTER — Ambulatory Visit (INDEPENDENT_AMBULATORY_CARE_PROVIDER_SITE_OTHER): Payer: BLUE CROSS/BLUE SHIELD | Admitting: Family Medicine

## 2016-04-27 ENCOUNTER — Encounter: Payer: Self-pay | Admitting: Family Medicine

## 2016-04-27 VITALS — BP 124/70 | HR 85 | Temp 98.8°F | Resp 18 | Ht 65.0 in | Wt 151.0 lb

## 2016-04-27 DIAGNOSIS — I10 Essential (primary) hypertension: Secondary | ICD-10-CM | POA: Diagnosis not present

## 2016-04-27 DIAGNOSIS — Z794 Long term (current) use of insulin: Secondary | ICD-10-CM

## 2016-04-27 DIAGNOSIS — E1165 Type 2 diabetes mellitus with hyperglycemia: Secondary | ICD-10-CM

## 2016-04-27 DIAGNOSIS — E785 Hyperlipidemia, unspecified: Secondary | ICD-10-CM

## 2016-04-27 DIAGNOSIS — IMO0001 Reserved for inherently not codable concepts without codable children: Secondary | ICD-10-CM

## 2016-04-27 LAB — POCT GLYCOSYLATED HEMOGLOBIN (HGB A1C): Hemoglobin A1C: 8

## 2016-04-27 MED ORDER — LISINOPRIL 20 MG PO TABS: 20.0000 mg | ORAL_TABLET | Freq: Every day | ORAL | Status: DC

## 2016-04-27 MED ORDER — LEVEMIR FLEXTOUCH 100 UNIT/ML ~~LOC~~ SOPN: 22.0000 [IU] | PEN_INJECTOR | Freq: Every day | SUBCUTANEOUS | Status: DC

## 2016-04-27 NOTE — Progress Notes (Signed)
Name: John MeigsDwight L Langhans   MRN: [id number]    DOB: 03/09/1956   Date:04/27/2016       Progress Note  Subjective  Chief Complaint  Chief Complaint  Patient presents with  . Hypertension    follow up  . Diabetes  . Hyperlipidemia    Hypertension This is a chronic problem. The problem is controlled. Pertinent negatives include no chest pain, headaches, palpitations or shortness of breath. Past treatments include ACE inhibitors. There is no history of kidney disease, CAD/MI or CVA.  Diabetes He presents for his follow-up diabetic visit. He has type 2 diabetes mellitus. His disease course has been stable. There are no hypoglycemic associated symptoms. Pertinent negatives for hypoglycemia include no headaches or nervousness/anxiousness. Associated symptoms include polydipsia and polyuria. Pertinent negatives for diabetes include no chest pain and no fatigue. Pertinent negatives for diabetic complications include no CVA. Current diabetic treatment includes intensive insulin program. He is following a diabetic diet. His breakfast blood glucose range is generally 110-130 mg/dl. (Sometimes, his pre-breakfast glucose reading is in the low 60s, although no symptoms reported.) An ACE inhibitor/angiotensin II receptor blocker is being taken. Eye exam is current.  Hyperlipidemia This is a chronic problem. The problem is controlled. Recent lipid tests were reviewed and are normal. Pertinent negatives include no chest pain, myalgias or shortness of breath.    Past Medical History  Diagnosis Date  . Hypertension   . Hyperlipidemia   . Diabetes mellitus without complication (HCC)   . Wears dentures     full upper and lower  . Arthritis     fingers    Past Surgical History  Procedure Laterality Date  . Eye surgery    . Colonoscopy    . Colonoscopy with propofol N/A 03/21/2016    Procedure: COLONOSCOPY WITH PROPOFOL;  Surgeon: Midge Miniumarren Wohl, MD;  [facility];  Service: Endoscopy;   Laterality: N/A;  Diabetic - insulin    Family History  Problem Relation Age of Onset  . Hypertension Mother   . Diabetes Mother     Social History   Social History  . Marital Status: Married    Spouse Name: N/A  . Number of Children: N/A  . Years of Education: N/A   Occupational History  . Not on file.   Social History Main Topics  . Smoking status: Current Some Day Smoker -- 0.25 packs/day for 35 years    Types: Cigarettes  . Smokeless tobacco: Never Used  . Alcohol Use: 0.6 oz/week    0 Standard drinks or equivalent, 1 Cans of beer per week  . Drug Use: No  . Sexual Activity:    Partners: Female   Other Topics Concern  . Not on file   Social History Narrative     Current outpatient prescriptions:  .  aspirin 81 MG tablet, Take 81 mg by mouth daily., Disp: , Rfl:  .  BAYER CONTOUR TEST test strip, TEST twice a day, Disp: 180 each, Rfl: 3 .  LEVEMIR FLEXTOUCH 100 UNIT/ML Pen, Inject 22 Units into the skin daily at 10 pm., Disp: 15 mL, Rfl: 5 .  lisinopril (PRINIVIL,ZESTRIL) 20 MG tablet, take 1 tablet by mouth once daily, Disp: 30 tablet, Rfl: 3 .  lovastatin (MEVACOR) 20 MG tablet, take 1 tablet by mouth at bedtime, Disp: 30 tablet, Rfl: 5 .  MICROLET LANCETS MISC, TEST BLOOD SUGAR twice a day or as directed by prescriber, Disp: 100 each, Rfl: 11 .  NOVOFINE 32G X 6  MM MISC, use 1 twice a day, Disp: 100 each, Rfl: 3 .  Vitamin D, Ergocalciferol, (DRISDOL) 50000 units CAPS capsule, Take 1 capsule (50,000 Units total) by mouth once a week. For 12 weeks, Disp: 12 capsule, Rfl: 0  No Known Allergies   Review of Systems  Constitutional: Negative for fever, chills and fatigue.  Respiratory: Negative for shortness of breath.   Cardiovascular: Negative for chest pain and palpitations.  Gastrointestinal: Negative for abdominal pain.  Musculoskeletal: Negative for myalgias.  Neurological: Negative for headaches.  Endo/Heme/Allergies: Positive for polydipsia.   Psychiatric/Behavioral: The patient is not nervous/anxious.     Objective  Filed Vitals:   04/27/16 0844  BP: 124/70  Pulse: 85  Temp: 98.8 F (37.1 C)  TempSrc: Oral  Resp: 18  Height:  (1.651 m)  Weight: 151 lb (68.493 kg)  SpO2: 97%    Physical Exam  Constitutional: He is oriented to person, place, and time and well-developed, well-nourished, and in no distress.  HENT:  Head: Normocephalic and atraumatic.  Neurological: He is alert and oriented to person, place, and time.  Psychiatric: Mood, memory, affect and judgment normal.  Nursing note and vitals reviewed.     Assessment & Plan  1. Essential hypertension BP stable and at goal on present therapy - lisinopril (PRINIVIL,ZESTRIL) 20 MG tablet; Take 1 tablet (20 mg total) by mouth daily.  Dispense: 90 tablet; Refill: 1  2. Hyperlipidemia FLP from 02/23/2016 reviewed and reassured. Recheck in 4 months  3. Uncontrolled type 2 diabetes mellitus without complication, with long-term current use of insulin (HCC) A1c is worse from 7.2% to 8.0% in the last 6 months. Patient has taken metformin in the past, caused blood sugar to drop fairly low. Does not wish to start on any oral medication. We will work on dietary interventions for now and recheck in 3-4 months. - LEVEMIR FLEXTOUCH 100 UNIT/ML Pen; Inject 22 Units into the skin daily at 10 pm.  Dispense: 15 mL; Refill: 5 - POCT HgB A1C   Rodrigus Kilker Asad A. [facility] 04/27/2016 8:46 AM

## 2016-05-27 ENCOUNTER — Ambulatory Visit (INDEPENDENT_AMBULATORY_CARE_PROVIDER_SITE_OTHER): Payer: BLUE CROSS/BLUE SHIELD | Admitting: Family Medicine

## 2016-05-27 ENCOUNTER — Encounter: Payer: Self-pay | Admitting: Family Medicine

## 2016-05-27 VITALS — BP 121/64 | HR 75 | Temp 98.5°F | Resp 15 | Ht 65.0 in | Wt 144.6 lb

## 2016-05-27 DIAGNOSIS — E559 Vitamin D deficiency, unspecified: Secondary | ICD-10-CM | POA: Diagnosis not present

## 2016-05-27 NOTE — Progress Notes (Signed)
Name: John MeigsDwight L Stokke   MRN: [id number]    DOB: 03/03/1956   Date:05/27/2016       Progress Note  Subjective  Chief Complaint  Chief Complaint  Patient presents with  . Follow-up    1 mo / recheck vita D levels    HPI  Vitamin D deficiency: Last vitamin D level drawn in March 2017 was below normal at 15.3ng/mL. He was started on Vitamin D 50,000 units every week and now presents for vitamin D level recheck.   Past Medical History  Diagnosis Date  . Hypertension   . Hyperlipidemia   . Diabetes mellitus without complication (HCC)   . Wears dentures     full upper and lower  . Arthritis     fingers    Past Surgical History  Procedure Laterality Date  . Eye surgery    . Colonoscopy    . Colonoscopy with propofol N/A 03/21/2016    Procedure: COLONOSCOPY WITH PROPOFOL;  Surgeon: Midge Miniumarren Wohl, MD;  [facility];  Service: Endoscopy;  Laterality: N/A;  Diabetic - insulin    Family History  Problem Relation Age of Onset  . Hypertension Mother   . Diabetes Mother     Social History   Social History  . Marital Status: Married    Spouse Name: N/A  . Number of Children: N/A  . Years of Education: N/A   Occupational History  . Not on file.   Social History Main Topics  . Smoking status: Current Some Day Smoker -- 0.25 packs/day for 35 years    Types: Cigarettes  . Smokeless tobacco: Never Used  . Alcohol Use: 0.6 oz/week    0 Standard drinks or equivalent, 1 Cans of beer per week  . Drug Use: No  . Sexual Activity:    Partners: Female   Other Topics Concern  . Not on file   Social History Narrative     Current outpatient prescriptions:  .  aspirin 81 MG tablet, Take 81 mg by mouth daily., Disp: , Rfl:  .  BAYER CONTOUR TEST test strip, TEST twice a day, Disp: 180 each, Rfl: 3 .  LEVEMIR FLEXTOUCH 100 UNIT/ML Pen, Inject 22 Units into the skin daily at 10 pm., Disp: 15 mL, Rfl: 5 .  lisinopril (PRINIVIL,ZESTRIL) 20 MG tablet, Take 1 tablet (20  mg total) by mouth daily., Disp: 90 tablet, Rfl: 1 .  lovastatin (MEVACOR) 20 MG tablet, take 1 tablet by mouth at bedtime, Disp: 30 tablet, Rfl: 5 .  MICROLET LANCETS MISC, TEST BLOOD SUGAR twice a day or as directed by prescriber, Disp: 100 each, Rfl: 11 .  NOVOFINE 32G X 6 MM MISC, use 1 twice a day, Disp: 100 each, Rfl: 3 .  Vitamin D, Ergocalciferol, (DRISDOL) 50000 units CAPS capsule, Take 1 capsule (50,000 Units total) by mouth once a week. For 12 weeks (Patient not taking: Reported on 05/27/2016), Disp: 12 capsule, Rfl: 0  No Known Allergies   Review of Systems  Constitutional: Negative for malaise/fatigue.  Gastrointestinal: Negative for abdominal pain.  Musculoskeletal: Negative for myalgias and back pain.     Objective  Filed Vitals:   05/27/16 1017  BP: 121/64  Pulse: 75  Temp: 98.5 F (36.9 C)  TempSrc: Oral  Resp: 15  Height: 5\' 5"  (1.651 m)  Weight: 144 lb 9.6 oz (65.59 kg)  SpO2: 98%    Physical Exam  Constitutional: He is oriented to person, place, and time and well-developed, well-nourished, and  in no distress.  Neurological: He is alert and oriented to person, place, and time.  Nursing note and vitals reviewed.    Recent Results (from the past 2160 hour(s))  Glucose, capillary     Status: Abnormal   Collection Time: 03/21/16  6:46 AM  Result Value Ref Range   Glucose-Capillary 169 (H) 65 - 99 mg/dL  Glucose, capillary     Status: Abnormal   Collection Time: 03/21/16  8:19 AM  Result Value Ref Range   Glucose-Capillary 156 (H) 65 - 99 mg/dL  POCT HgB [id number]     Status: None   Collection Time: 04/27/16  9:03 AM  Result Value Ref Range   Hemoglobin A1C 8.0      Assessment & Plan  1. Vitamin D deficiency  - Vitamin D (25 hydroxy)  Dayshon Roback Asad A. Faylene KurtzSh[facility] 05/27/2016 10:28 AM

## 2016-05-28 LAB — VITAMIN D 25 HYDROXY (VIT D DEFICIENCY, FRACTURES): Vit D, 25-Hydroxy: 51.3 ng/mL (ref 30.0–100.0)

## 2016-08-24 ENCOUNTER — Ambulatory Visit (INDEPENDENT_AMBULATORY_CARE_PROVIDER_SITE_OTHER): Payer: BLUE CROSS/BLUE SHIELD | Admitting: Family Medicine

## 2016-08-24 ENCOUNTER — Encounter: Payer: Self-pay | Admitting: Family Medicine

## 2016-08-24 VITALS — BP 118/68 | HR 96 | Temp 98.7°F | Resp 16 | Ht 65.0 in | Wt 145.9 lb

## 2016-08-24 DIAGNOSIS — Z794 Long term (current) use of insulin: Secondary | ICD-10-CM

## 2016-08-24 DIAGNOSIS — IMO0001 Reserved for inherently not codable concepts without codable children: Secondary | ICD-10-CM

## 2016-08-24 DIAGNOSIS — E785 Hyperlipidemia, unspecified: Secondary | ICD-10-CM

## 2016-08-24 DIAGNOSIS — Z23 Encounter for immunization: Secondary | ICD-10-CM | POA: Diagnosis not present

## 2016-08-24 DIAGNOSIS — I10 Essential (primary) hypertension: Secondary | ICD-10-CM

## 2016-08-24 DIAGNOSIS — E1165 Type 2 diabetes mellitus with hyperglycemia: Secondary | ICD-10-CM

## 2016-08-24 LAB — LIPID PANEL
Cholesterol: 137 mg/dL (ref 125–200)
HDL: 51 mg/dL (ref >=40)
LDL Cholesterol: 74 mg/dL (ref <130)
Total CHOL/HDL Ratio: 2.7 Ratio (ref <=5.0)
Triglycerides: 62 mg/dL (ref <150)
VLDL: 12 mg/dL (ref <30)

## 2016-08-24 LAB — COMPLETE METABOLIC PANEL WITH GFR
ALT: 16 U/L (ref 9–46)
AST: 19 U/L (ref 10–35)
Albumin: 4.5 g/dL (ref 3.6–5.1)
Alkaline Phosphatase: 51 U/L (ref 40–115)
BUN: 15 mg/dL (ref 7–25)
CO2: 24 mmol/L (ref 20–31)
Calcium: 9.4 mg/dL (ref 8.6–10.3)
Chloride: 104 mmol/L (ref 98–110)
Creat: 1.05 mg/dL (ref 0.70–1.33)
GFR, Est African American: 89 mL/min (ref >=60)
GFR, Est Non African American: 77 mL/min (ref >=60)
Glucose, Bld: 104 mg/dL — ABNORMAL HIGH (ref 65–99)
Potassium: 4.4 mmol/L (ref 3.5–5.3)
Sodium: 138 mmol/L (ref 135–146)
Total Bilirubin: 0.4 mg/dL (ref 0.2–1.2)
Total Protein: 7 g/dL (ref 6.1–8.1)

## 2016-08-24 LAB — POCT GLYCOSYLATED HEMOGLOBIN (HGB A1C): Hemoglobin A1C: 7.7

## 2016-08-24 LAB — POCT UA - MICROALBUMIN
Albumin/Creatinine Ratio, Urine, POC: NEGATIVE
Creatinine, POC: NEGATIVE mg/dL
Microalbumin Ur, POC: NEGATIVE mg/L

## 2016-08-24 LAB — GLUCOSE, POCT (MANUAL RESULT ENTRY): POC Glucose: 110 mg/dl — AB (ref 70–99)

## 2016-08-24 MED ORDER — LEVEMIR FLEXTOUCH 100 UNIT/ML ~~LOC~~ SOPN: 22.0000 [IU] | PEN_INJECTOR | Freq: Every day | SUBCUTANEOUS | 5 refills | Status: DC

## 2016-08-24 MED ORDER — SITAGLIPTIN PHOSPHATE 100 MG PO TABS: 100.0000 mg | ORAL_TABLET | Freq: Every day | ORAL | 1 refills | Status: DC

## 2016-08-24 MED ORDER — LISINOPRIL 20 MG PO TABS: 20.0000 mg | ORAL_TABLET | Freq: Every day | ORAL | 1 refills | Status: DC

## 2016-08-24 MED ORDER — LOVASTATIN 20 MG PO TABS: 20.0000 mg | ORAL_TABLET | Freq: Every day | ORAL | 1 refills | Status: DC

## 2016-08-24 NOTE — Progress Notes (Signed)
Name: John MeigsDwight L Howard   MRN: [id number]    DOB: 01/06/1956   Date:08/24/2016       Progress Note  Subjective  Chief Complaint  Chief Complaint  Patient presents with  . Follow-up    3 mo    Hypertension  This is a chronic problem. The problem is controlled. Pertinent negatives include no chest pain, headaches, palpitations or shortness of breath. Past treatments include ACE inhibitors. There is no history of kidney disease, CAD/MI or CVA.  Diabetes  He presents for his follow-up diabetic visit. He has type 2 diabetes mellitus. His disease course has been stable. There are no hypoglycemic associated symptoms. Pertinent negatives for hypoglycemia include no headaches or nervousness/anxiousness. Associated symptoms include polydipsia. Pertinent negatives for diabetes include no chest pain, no fatigue and no polyuria. Pertinent negatives for diabetic complications include no CVA. Current diabetic treatment includes intensive insulin program. He is following a diabetic diet. His breakfast blood glucose range is generally 90-110 mg/dl. (Sometimes, his pre-breakfast glucose reading is in the low 60s, although no symptoms reported.) An ACE inhibitor/angiotensin II receptor blocker is being taken. Eye exam is current.  Hyperlipidemia  This is a chronic problem. The problem is controlled. Recent lipid tests were reviewed and are normal. Pertinent negatives include no chest pain, myalgias or shortness of breath. Current antihyperlipidemic treatment includes statins.    Past Medical History:  Diagnosis Date  . Arthritis    fingers  . Diabetes mellitus without complication (HCC)   . Hyperlipidemia   . Hypertension   . Wears dentures    full upper and lower    Past Surgical History:  Procedure Laterality Date  . COLONOSCOPY    . COLONOSCOPY WITH PROPOFOL N/A 03/21/2016   Procedure: COLONOSCOPY WITH PROPOFOL;  Surgeon: Midge Miniumarren Wohl, MD;  [facility];  Service: Endoscopy;   Laterality: N/A;  Diabetic - insulin  . EYE SURGERY      Family History  Problem Relation Age of Onset  . Hypertension Mother   . Diabetes Mother     Social History   Social History  . Marital status: Married    Spouse name: N/A  . Number of children: N/A  . Years of education: N/A   Occupational History  . Not on file.   Social History Main Topics  . Smoking status: Current Some Day Smoker    Packs/day: 0.25    Years: 35.00    Types: Cigarettes  . Smokeless tobacco: Never Used  . Alcohol use 0.6 oz/week    1 Cans of beer per week  . Drug use: No  . Sexual activity: Yes    Partners: Female   Other Topics Concern  . Not on file   Social History Narrative  . No narrative on file     Current Outpatient Prescriptions:  .  aspirin 81 MG tablet, Take 81 mg by mouth daily., Disp: , Rfl:  .  BAYER CONTOUR TEST test strip, TEST twice a day, Disp: 180 each, Rfl: 3 .  LEVEMIR FLEXTOUCH 100 UNIT/ML Pen, Inject 22 Units into the skin daily at 10 pm., Disp: 15 mL, Rfl: 5 .  lisinopril (PRINIVIL,ZESTRIL) 20 MG tablet, Take 1 tablet (20 mg total) by mouth daily., Disp: 90 tablet, Rfl: 1 .  lovastatin (MEVACOR) 20 MG tablet, take 1 tablet by mouth at bedtime, Disp: 30 tablet, Rfl: 5 .  MICROLET LANCETS MISC, TEST BLOOD SUGAR twice a day or as directed by prescriber, Disp: 100 each, Rfl:  11 .  NOVOFINE 32G X 6 MM MISC, use 1 twice a day, Disp: 100 each, Rfl: 3 .  Vitamin D, Ergocalciferol, (DRISDOL) 50000 units CAPS capsule, Take 1 capsule (50,000 Units total) by mouth once a week. For 12 weeks (Patient not taking: Reported on 08/24/2016), Disp: 12 capsule, Rfl: 0  No Known Allergies   Review of Systems  Constitutional: Negative for fatigue.  Respiratory: Negative for shortness of breath.   Cardiovascular: Negative for chest pain and palpitations.  Musculoskeletal: Negative for myalgias.  Neurological: Negative for headaches.  Endo/Heme/Allergies: Positive for polydipsia.   Psychiatric/Behavioral: The patient is not nervous/anxious.     Objective  Vitals:   08/24/16 0845  BP: 118/68  Pulse: 96  Resp: 16  Temp: 98.7 F (37.1 C)  TempSrc: Oral  SpO2: 97%  Weight: 145 lb 14.4 oz (66.2 kg)  Height: 5\' 5"  (1.651 m)    Physical Exam  Constitutional: He is oriented to person, place, and time and well-developed, well-nourished, and in no distress.  HENT:  Head: Normocephalic and atraumatic.  Cardiovascular: Normal rate, regular rhythm, S1 normal and S2 normal.   Pulmonary/Chest: Breath sounds normal. He has no wheezes. He has no rhonchi.  Abdominal: Soft. Bowel sounds are normal. There is no tenderness.  Musculoskeletal:       Right ankle: He exhibits no swelling.       Left ankle: He exhibits no swelling.  Neurological: He is alert and oriented to person, place, and time.  Psychiatric: Mood, memory, affect and judgment normal.  Nursing note and vitals reviewed.     Assessment & Plan  1. Uncontrolled type 2 diabetes mellitus without complication, with long-term current use of insulin (HCC) Hemoglobin A1c 7.7%, above goal. We'll start on Januvia 100 mg daily. Continue on basal and mealtime insulin - POCT HgB A1C - POCT Glucose (CBG) - LEVEMIR FLEXTOUCH 100 UNIT/ML Pen; Inject 22 Units into the skin daily at 10 pm.  Dispense: 15 mL; Refill: 5 - POCT UA - Microalbumin - sitaGLIPtin (JANUVIA) 100 MG tablet; Take 1 tablet (100 mg total) by mouth daily.  Dispense: 90 tablet; Refill: 1  2. Hyperlipidemia FLP at goal, continue on statin therapy - Lipid Profile - COMPLETE METABOLIC PANEL WITH GFR - lovastatin (MEVACOR) 20 MG tablet; Take 1 tablet (20 mg total) by mouth at bedtime.  Dispense: 90 tablet; Refill: 1  3. Essential hypertension  - lisinopril (PRINIVIL,ZESTRIL) 20 MG tablet; Take 1 tablet (20 mg total) by mouth daily.  Dispense: 90 tablet; Refill: 1   4. Need for immunization against influenza  - Flu Vaccine QUAD 36+ mos PF IM  (Fluarix & Fluzone Quad PF)   Kashlynn Kundert Asad A. Faylene Kurtz [facility] 08/24/2016 8:59 AM

## 2016-10-07 ENCOUNTER — Other Ambulatory Visit: Payer: Self-pay | Admitting: Family Medicine

## 2016-10-12 ENCOUNTER — Telehealth: Payer: Self-pay | Admitting: Family Medicine

## 2016-10-12 ENCOUNTER — Other Ambulatory Visit: Payer: Self-pay | Admitting: Family Medicine

## 2016-10-12 MED ORDER — MICROLET LANCETS MISC: 11 refills | Status: DC

## 2016-10-12 MED ORDER — GLUCOSE BLOOD VI STRP: ORAL_STRIP | 3 refills | Status: DC

## 2016-10-12 NOTE — Telephone Encounter (Signed)
PT SAYS THAT THE PHARM HAS SENT TWO OR THREE TIMES REQUEST FOR HIS TEST STRIPS AND THE NEEDLES . HE HAS CONTOUR MACHINE. PHARM IS [facility] ON [address]

## 2016-10-12 NOTE — Telephone Encounter (Signed)
Prescription for test strips and lancets has been sent to pharmacy

## 2016-10-19 ENCOUNTER — Telehealth: Payer: Self-pay | Admitting: Family Medicine

## 2016-10-19 NOTE — Telephone Encounter (Signed)
Too early to refill patient has refill on 09/14/2016 100 each with 3 refills, he has been notified

## 2016-10-19 NOTE — Telephone Encounter (Signed)
Pt is needing a refill on the Novofine 32G x 6 MM needles [facility] [address]

## 2016-10-21 ENCOUNTER — Other Ambulatory Visit: Payer: Self-pay | Admitting: Family Medicine

## 2016-10-25 ENCOUNTER — Telehealth: Payer: Self-pay

## 2016-10-25 MED ORDER — INSULIN PEN NEEDLE 32G X 6 MM MISC: 3 refills | Status: DC

## 2016-10-25 NOTE — Telephone Encounter (Signed)
Novofine insulin needles has been refilled and sent to [facility] [address]

## 2016-11-23 ENCOUNTER — Encounter: Payer: Self-pay | Admitting: Family Medicine

## 2016-11-23 ENCOUNTER — Ambulatory Visit (INDEPENDENT_AMBULATORY_CARE_PROVIDER_SITE_OTHER): Payer: BLUE CROSS/BLUE SHIELD | Admitting: Family Medicine

## 2016-11-23 VITALS — BP 130/70 | HR 96 | Temp 98.4°F | Resp 16 | Ht 66.25 in | Wt 149.5 lb

## 2016-11-23 DIAGNOSIS — G8929 Other chronic pain: Secondary | ICD-10-CM | POA: Diagnosis not present

## 2016-11-23 DIAGNOSIS — M25511 Pain in right shoulder: Secondary | ICD-10-CM | POA: Diagnosis not present

## 2016-11-23 DIAGNOSIS — E1165 Type 2 diabetes mellitus with hyperglycemia: Secondary | ICD-10-CM | POA: Diagnosis not present

## 2016-11-23 DIAGNOSIS — I10 Essential (primary) hypertension: Secondary | ICD-10-CM

## 2016-11-23 DIAGNOSIS — IMO0001 Reserved for inherently not codable concepts without codable children: Secondary | ICD-10-CM

## 2016-11-23 DIAGNOSIS — E785 Hyperlipidemia, unspecified: Secondary | ICD-10-CM

## 2016-11-23 DIAGNOSIS — Z794 Long term (current) use of insulin: Secondary | ICD-10-CM | POA: Diagnosis not present

## 2016-11-23 LAB — POCT GLYCOSYLATED HEMOGLOBIN (HGB A1C): Hemoglobin A1C: 7.2

## 2016-11-23 MED ORDER — SITAGLIPTIN PHOSPHATE 100 MG PO TABS: 100.0000 mg | ORAL_TABLET | Freq: Every day | ORAL | 1 refills | Status: DC

## 2016-11-23 NOTE — Progress Notes (Signed)
Name: John Howard   MRN: [id number]    DOB: 08/11/1956   Date:11/23/2016       Progress Note  Subjective  Chief Complaint  Chief Complaint  Patient presents with  . Hyperlipidemia    patient does not report of any neg sx  . Hypertension    patient does not report of any neg sx  . Diabetes    patient is here for his 3 month f/u. checks BS regularly. this morning 105. highest: 167 & lowest: 67.  . Shoulder Pain    patient stated that he thinks it is arthritis from lifting a lot of boxes. otc ibuprofen - helps.    Hyperlipidemia  This is a chronic problem. The problem is controlled. Recent lipid tests were reviewed and are normal. Exacerbating diseases include diabetes. Pertinent negatives include no chest pain, myalgias or shortness of breath. Current antihyperlipidemic treatment includes statins.  Hypertension  This is a chronic problem. The problem is unchanged. The problem is controlled. Pertinent negatives include no chest pain, headaches, palpitations, shortness of breath or sweats. Past treatments include ACE inhibitors. There is no history of kidney disease, CAD/MI or CVA.  Diabetes  He presents for his follow-up diabetic visit. He has type 2 diabetes mellitus. His disease course has been stable. There are no hypoglycemic associated symptoms. Pertinent negatives for hypoglycemia include no headaches, nervousness/anxiousness or sweats. Pertinent negatives for diabetes include no chest pain, no fatigue, no polydipsia and no polyuria. Pertinent negatives for diabetic complications include no CVA. Current diabetic treatment includes intensive insulin program and oral agent (monotherapy). He is following a diabetic diet. His breakfast blood glucose range is generally 90-110 mg/dl. (Sometimes, his pre-breakfast glucose reading is in the low 60s, although no symptoms reported.) An ACE inhibitor/angiotensin II receptor blocker is being taken. Eye exam is current.     Past Medical  History:  Diagnosis Date  . Arthritis    fingers  . Diabetes mellitus without complication (HCC)   . Hyperlipidemia   . Hypertension   . Wears dentures    full upper and lower    Past Surgical History:  Procedure Laterality Date  . COLONOSCOPY    . COLONOSCOPY WITH PROPOFOL N/A 03/21/2016   Procedure: COLONOSCOPY WITH PROPOFOL;  Surgeon: Midge Miniumarren Wohl, MD;  Location: [facility];  Service: Endoscopy;  Laterality: N/A;  Diabetic - insulin  . EYE SURGERY      Family History  Problem Relation Age of Onset  . Hypertension Mother   . Diabetes Mother     Social History   Social History  . Marital status: Married    Spouse name: N/A  . Number of children: N/A  . Years of education: N/A   Occupational History  . Not on file.   Social History Main Topics  . Smoking status: Current Some Day Smoker    Packs/day: 0.25    Years: 35.00    Types: Cigarettes  . Smokeless tobacco: Never Used  . Alcohol use 0.6 oz/week    1 Cans of beer per week  . Drug use: No  . Sexual activity: Yes    Partners: Female   Other Topics Concern  . Not on file   Social History Narrative  . No narrative on file     Current Outpatient Prescriptions:  .  aspirin 81 MG tablet, Take 81 mg by mouth daily., Disp: , Rfl:  .  glucose blood (BAYER CONTOUR TEST) test strip, Use as directed to check blood  glucose twice daily, Disp: 180 each, Rfl: 3 .  Insulin Pen Needle (NOVOFINE) 32G X 6 MM MISC, use 1 twice a day, Disp: 100 each, Rfl: 3 .  LEVEMIR FLEXTOUCH 100 UNIT/ML Pen, Inject 22 Units into the skin daily at 10 pm., Disp: 15 mL, Rfl: 5 .  lisinopril (PRINIVIL,ZESTRIL) 20 MG tablet, Take 1 tablet (20 mg total) by mouth daily., Disp: 90 tablet, Rfl: 1 .  lovastatin (MEVACOR) 20 MG tablet, Take 1 tablet (20 mg total) by mouth at bedtime., Disp: 90 tablet, Rfl: 1 .  MICROLET LANCETS MISC, TEST BLOOD SUGAR three times a day, Disp: 100 each, Rfl: 11 .  sitaGLIPtin (JANUVIA) 100 MG tablet, Take 1  tablet (100 mg total) by mouth daily., Disp: 90 tablet, Rfl: 1  No Known Allergies   Review of Systems  Constitutional: Negative for fatigue.  Respiratory: Negative for shortness of breath.   Cardiovascular: Negative for chest pain and palpitations.  Musculoskeletal: Negative for myalgias.  Neurological: Negative for headaches.  Endo/Heme/Allergies: Negative for polydipsia.  Psychiatric/Behavioral: The patient is not nervous/anxious.     Objective  Vitals:   11/23/16 0753  BP: 130/70  Pulse: 96  Resp: 16  Temp: 98.4 F (36.9 C)  TempSrc: Oral  SpO2: 96%  Weight: 149 lb 8 oz (67.8 kg)  Height: 5' 6.25" (1.683 m)    Physical Exam  Constitutional: He is oriented to person, place, and time and well-developed, well-nourished, and in no distress.  HENT:  Head: Normocephalic and atraumatic.  Cardiovascular: Normal rate, regular rhythm, S1 normal and S2 normal.   Pulmonary/Chest: Breath sounds normal. He has no wheezes. He has no rhonchi.  Abdominal: Soft. Bowel sounds are normal. There is no tenderness.  Musculoskeletal: He exhibits no edema.       Right ankle: He exhibits no swelling.       Left ankle: He exhibits no swelling.  Neurological: He is alert and oriented to person, place, and time.  Psychiatric: Mood, memory, affect and judgment normal.  Nursing note and vitals reviewed.     Assessment & Plan  1. Essential hypertension BP stable on present antihypertensive therapy  2. Uncontrolled type 2 diabetes mellitus without complication, with long-term current use of insulin (HCC) Left A1c 7.2%, well-controlled diabetes - POCT HgB A1C - sitaGLIPtin (JANUVIA) 100 MG tablet; Take 1 tablet (100 mg total) by mouth daily.  Dispense: 90 tablet; Refill: 1  3. Hyperlipidemia, unspecified hyperlipidemia type Recheck FLP in March 2018  4. Chronic right shoulder pain Pain is present intermittently for many years, worse with certain activities, appears to be related to  osteoarthritis, patient is not bothered by the pain at this time. Recommended applying heat and continuing activity as tolerated.   Blen Ransome Asad A. Faylene KurtzShah [facility] 11/23/2016 8:16 AM

## 2017-01-22 ENCOUNTER — Encounter: Payer: Self-pay | Admitting: Family Medicine

## 2017-02-22 ENCOUNTER — Ambulatory Visit: Payer: BLUE CROSS/BLUE SHIELD | Admitting: Family Medicine

## 2017-02-23 ENCOUNTER — Encounter: Payer: Self-pay | Admitting: Family Medicine

## 2017-02-23 ENCOUNTER — Ambulatory Visit (INDEPENDENT_AMBULATORY_CARE_PROVIDER_SITE_OTHER): Payer: BLUE CROSS/BLUE SHIELD | Admitting: Family Medicine

## 2017-02-23 VITALS — BP 126/70 | HR 76 | Temp 98.1°F | Resp 16 | Ht 66.0 in | Wt 147.7 lb

## 2017-02-23 DIAGNOSIS — E119 Type 2 diabetes mellitus without complications: Secondary | ICD-10-CM

## 2017-02-23 DIAGNOSIS — I10 Essential (primary) hypertension: Secondary | ICD-10-CM | POA: Diagnosis not present

## 2017-02-23 DIAGNOSIS — E7800 Pure hypercholesterolemia, unspecified: Secondary | ICD-10-CM | POA: Diagnosis not present

## 2017-02-23 LAB — LIPID PANEL
Cholesterol: 129 mg/dL (ref <200)
HDL: 57 mg/dL (ref >40)
LDL Cholesterol: 63 mg/dL (ref <100)
Total CHOL/HDL Ratio: 2.3 Ratio (ref <5.0)
Triglycerides: 46 mg/dL (ref <150)
VLDL: 9 mg/dL (ref <30)

## 2017-02-23 LAB — POCT GLYCOSYLATED HEMOGLOBIN (HGB A1C): Hemoglobin A1C: 7

## 2017-02-23 LAB — GLUCOSE, POCT (MANUAL RESULT ENTRY): POC Glucose: 73 mg/dl (ref 70–99)

## 2017-02-23 MED ORDER — LEVEMIR FLEXTOUCH 100 UNIT/ML ~~LOC~~ SOPN: 22.0000 [IU] | PEN_INJECTOR | Freq: Every day | SUBCUTANEOUS | 5 refills | Status: DC

## 2017-02-23 NOTE — Progress Notes (Signed)
Name: John Howard   MRN: [id number]    DOB: [birth year]   Date:02/23/2017       Progress Note  Subjective  Chief Complaint  Chief Complaint  Patient presents with  . Diabetes    3 month follow up  . Hypertension    Diabetes  He presents for his follow-up diabetic visit. He has type 2 diabetes mellitus. His disease course has been stable. There are no hypoglycemic associated symptoms. Pertinent negatives for hypoglycemia include no headaches. Pertinent negatives for diabetes include no blurred vision, no chest pain, no fatigue, no foot paresthesias, no foot ulcerations, no polydipsia and no polyuria. There are no hypoglycemic complications. Pertinent negatives for diabetic complications include no CVA. Current diabetic treatment includes insulin injections and oral agent (monotherapy). He is following a diabetic diet. His breakfast blood glucose range is generally 90-110 mg/dl. An ACE inhibitor/angiotensin II receptor blocker is being taken. Eye exam is current.  Hypertension  This is a chronic problem. The problem is unchanged. Pertinent negatives include no blurred vision, chest pain, headaches, orthopnea, palpitations or shortness of breath. Past treatments include ACE inhibitors. There is no history of kidney disease, CAD/MI or CVA.  Hyperlipidemia  This is a chronic problem. The problem is controlled. Recent lipid tests were reviewed and are normal. Exacerbating diseases include diabetes. Pertinent negatives include no chest pain, leg pain, myalgias or shortness of breath. Current antihyperlipidemic treatment includes statins.     Past Medical History:  Diagnosis Date  . Arthritis    fingers  . Diabetes mellitus without complication (HCC)   . Hyperlipidemia   . Hypertension   . Wears dentures    full upper and lower    Past Surgical History:  Procedure Laterality Date  . COLONOSCOPY    . COLONOSCOPY WITH PROPOFOL N/A 03/21/2016   Procedure: COLONOSCOPY WITH PROPOFOL;   Surgeon: Midge Minium, MD;  Location: [facility];  Service: Endoscopy;  Laterality: N/A;  Diabetic - insulin  . EYE SURGERY      Family History  Problem Relation Age of Onset  . Hypertension Mother   . Diabetes Mother     Social History   Social History  . Marital status: Married    Spouse name: N/A  . Number of children: N/A  . Years of education: N/A   Occupational History  . Not on file.   Social History Main Topics  . Smoking status: Current Some Day Smoker    Packs/day: 0.25    Years: 35.00    Types: Cigarettes  . Smokeless tobacco: Never Used  . Alcohol use 0.6 oz/week    1 Cans of beer per week  . Drug use: No  . Sexual activity: Yes    Partners: Female   Other Topics Concern  . Not on file   Social History Narrative  . No narrative on file     Current Outpatient Prescriptions:  .  aspirin 81 MG tablet, Take 81 mg by mouth daily., Disp: , Rfl:  .  glucose blood (BAYER CONTOUR TEST) test strip, Use as directed to check blood glucose twice daily, Disp: 180 each, Rfl: 3 .  Insulin Pen Needle (NOVOFINE) 32G X 6 MM MISC, use 1 twice a day, Disp: 100 each, Rfl: 3 .  LEVEMIR FLEXTOUCH 100 UNIT/ML Pen, Inject 22 Units into the skin daily at 10 pm., Disp: 15 mL, Rfl: 5 .  lisinopril (PRINIVIL,ZESTRIL) 20 MG tablet, Take 1 tablet (20 mg total) by mouth daily., Disp: 90  tablet, Rfl: 1 .  lovastatin (MEVACOR) 20 MG tablet, Take 1 tablet (20 mg total) by mouth at bedtime., Disp: 90 tablet, Rfl: 1 .  MICROLET LANCETS MISC, TEST BLOOD SUGAR three times a day, Disp: 100 each, Rfl: 11 .  sitaGLIPtin (JANUVIA) 100 MG tablet, Take 1 tablet (100 mg total) by mouth daily., Disp: 90 tablet, Rfl: 1  No Known Allergies   Review of Systems  Constitutional: Negative for fatigue.  Eyes: Negative for blurred vision.  Respiratory: Negative for shortness of breath.   Cardiovascular: Negative for chest pain, palpitations and orthopnea.  Musculoskeletal: Negative for  myalgias.  Neurological: Negative for headaches.  Endo/Heme/Allergies: Negative for polydipsia.     Objective  Vitals:   02/23/17 0942  BP: 126/70  Pulse: 76  Resp: 16  Temp: 98.1 F (36.7 C)  TempSrc: Oral  SpO2: 98%  Weight: 147 lb 11.2 oz (67 kg)  Height: 5\' 6"  (1.676 m)    Physical Exam  Constitutional: He is oriented to person, place, and time and well-developed, well-nourished, and in no distress.  HENT:  Head: Normocephalic and atraumatic.  Cardiovascular: Normal rate, regular rhythm, S1 normal and S2 normal.   Pulmonary/Chest: Breath sounds normal. He has no wheezes. He has no rhonchi.  Abdominal: Soft. Bowel sounds are normal. There is no tenderness.  Musculoskeletal:       Right ankle: He exhibits no swelling.       Left ankle: He exhibits no swelling.  Neurological: He is alert and oriented to person, place, and time.  Psychiatric: Mood, memory, affect and judgment normal.  Nursing note and vitals reviewed.    Recent Results (from the past 2160 hour(s))  POCT HgB A1C     Status: None   Collection Time: 02/23/17  9:41 AM  Result Value Ref Range   Hemoglobin A1C 7.0   POCT Glucose (CBG)     Status: None   Collection Time: 02/23/17  9:41 AM  Result Value Ref Range   POC Glucose 73 70 - 99 mg/dl     Assessment & Plan  1. Type 2 diabetes mellitus without complication, without long-term current use of insulin (HCC) A1c is evident 0.0%, well-controlled diabetes, no change in pharmacotherapy - POCT HgB A1C - POCT Glucose (CBG) - LEVEMIR FLEXTOUCH 100 UNIT/ML Pen; Inject 22 Units into the skin daily at 10 pm.  Dispense: 15 mL; Refill: 5  2. Pure hypercholesterolemia Obtain FLP, continue on statin - Lipid panel  3. Essential hypertension BP stable on lisinopril 40 mg daily [facility] 02/23/2017 10:38 AM

## 2017-03-20 ENCOUNTER — Other Ambulatory Visit: Payer: Self-pay | Admitting: Family Medicine

## 2017-03-20 DIAGNOSIS — E785 Hyperlipidemia, unspecified: Secondary | ICD-10-CM

## 2017-03-27 ENCOUNTER — Encounter: Payer: Self-pay | Admitting: Family Medicine

## 2017-03-27 ENCOUNTER — Ambulatory Visit (INDEPENDENT_AMBULATORY_CARE_PROVIDER_SITE_OTHER): Payer: BLUE CROSS/BLUE SHIELD | Admitting: Family Medicine

## 2017-03-27 VITALS — BP 122/64 | HR 81 | Temp 98.0°F | Resp 16 | Ht 66.0 in | Wt 148.2 lb

## 2017-03-27 DIAGNOSIS — Z0000 Encounter for general adult medical examination without abnormal findings: Secondary | ICD-10-CM

## 2017-03-27 LAB — CBC WITH DIFFERENTIAL/PLATELET
Basophils Absolute: 0 cells/uL (ref 0–200)
Basophils Relative: 0 %
Eosinophils Absolute: 79 cells/uL (ref 15–500)
Eosinophils Relative: 1 %
HCT: 44.8 % (ref 38.5–50.0)
Hemoglobin: 15.3 g/dL (ref 13.2–17.1)
Lymphocytes Relative: 31 %
Lymphs Abs: 2449 cells/uL (ref 850–3900)
MCH: 31.2 pg (ref 27.0–33.0)
MCHC: 34.2 g/dL (ref 32.0–36.0)
MCV: 91.2 fL (ref 80.0–100.0)
MPV: 10 fL (ref 7.5–12.5)
Monocytes Absolute: 553 cells/uL (ref 200–950)
Monocytes Relative: 7 %
Neutro Abs: 4819 cells/uL (ref 1500–7800)
Neutrophils Relative %: 61 %
Platelets: 239 10*3/uL (ref 140–400)
RBC: 4.91 MIL/uL (ref 4.20–5.80)
RDW: 13.8 % (ref 11.0–15.0)
WBC: 7.9 10*3/uL (ref 3.8–10.8)

## 2017-03-27 LAB — TSH: TSH: 2.12 mIU/L (ref 0.40–4.50)

## 2017-03-27 LAB — PSA: PSA: 1.3 ng/mL (ref <=4.0)

## 2017-03-27 NOTE — Progress Notes (Signed)
Name: John Howard   MRN: [id number]    DOB: 01-22-1956   Date:03/27/2017       Progress Note  Subjective  Chief Complaint  Chief Complaint  Patient presents with  . Annual Exam    HPI  Pt. Presents for Annual physical exam. Last colonoscopy in 2017, repeat in 2027, PSA was normal in March 2017.   Past Medical History:  Diagnosis Date  . Arthritis    fingers  . Diabetes mellitus without complication (HCC)   . Hyperlipidemia   . Hypertension   . Wears dentures    full upper and lower    Past Surgical History:  Procedure Laterality Date  . COLONOSCOPY    . COLONOSCOPY WITH PROPOFOL N/A 03/21/2016   Procedure: COLONOSCOPY WITH PROPOFOL;  Surgeon: Midge Minium, MD;  Location: [facility];  Service: Endoscopy;  Laterality: N/A;  Diabetic - insulin  . EYE SURGERY      Family History  Problem Relation Age of Onset  . Hypertension Mother   . Diabetes Mother     Social History   Social History  . Marital status: Married    Spouse name: N/A  . Number of children: N/A  . Years of education: N/A   Occupational History  . Not on file.   Social History Main Topics  . Smoking status: Current Some Day Smoker    Packs/day: 0.25    Years: 35.00    Types: Cigarettes  . Smokeless tobacco: Never Used  . Alcohol use 0.6 oz/week    1 Cans of beer per week  . Drug use: No  . Sexual activity: Yes    Partners: Female   Other Topics Concern  . Not on file   Social History Narrative  . No narrative on file     Current Outpatient Prescriptions:  .  aspirin 81 MG tablet, Take 81 mg by mouth daily., Disp: , Rfl:  .  glucose blood (BAYER CONTOUR TEST) test strip, Use as directed to check blood glucose twice daily, Disp: 180 each, Rfl: 3 .  Insulin Pen Needle (NOVOFINE) 32G X 6 MM MISC, use 1 twice a day, Disp: 100 each, Rfl: 3 .  LEVEMIR FLEXTOUCH 100 UNIT/ML Pen, Inject 22 Units into the skin daily at 10 pm., Disp: 15 mL, Rfl: 5 .  lisinopril (PRINIVIL,ZESTRIL)  20 MG tablet, Take 1 tablet (20 mg total) by mouth daily., Disp: 90 tablet, Rfl: 1 .  lovastatin (MEVACOR) 20 MG tablet, take 1 tablet by mouth at bedtime, Disp: 90 tablet, Rfl: 1 .  MICROLET LANCETS MISC, TEST BLOOD SUGAR three times a day, Disp: 100 each, Rfl: 11 .  sitaGLIPtin (JANUVIA) 100 MG tablet, Take 1 tablet (100 mg total) by mouth daily., Disp: 90 tablet, Rfl: 1  No Known Allergies   Review of Systems  Constitutional: Negative for chills, fever and malaise/fatigue.  HENT: Negative for congestion, ear pain and sore throat.   Eyes: Negative for blurred vision and double vision.  Respiratory: Negative for cough, sputum production and shortness of breath.   Cardiovascular: Negative for chest pain, palpitations and leg swelling.  Gastrointestinal: Negative for abdominal pain, blood in stool, nausea and vomiting.  Genitourinary: Negative for dysuria and hematuria.  Musculoskeletal: Positive for neck pain.  Skin: Negative for rash.  Neurological: Negative for dizziness and headaches.  Psychiatric/Behavioral: Negative for depression. The patient is not nervous/anxious and does not have insomnia.       Objective  Vitals:   03/27/17  1113  BP: 122/64  Pulse: 81  Resp: 16  Temp: 98 F (36.7 C)  SpO2: 98%  Weight: 148 lb 3 oz (67.2 kg)  Height:  (1.676 m)    Physical Exam  Constitutional: He is oriented to person, place, and time and well-developed, well-nourished, and in no distress.  HENT:  Head: Normocephalic and atraumatic.  Right Ear: External ear normal.  Left Ear: External ear normal.  Eyes: Left pupil is not round and not reactive.  Cardiovascular: Normal rate, regular rhythm, S1 normal, S2 normal and normal heart sounds.   No murmur heard. Pulmonary/Chest: Effort normal and breath sounds normal. He has no decreased breath sounds. He has no wheezes. He has no rhonchi.  Abdominal: Soft. Bowel sounds are normal. There is no tenderness.  Genitourinary: Rectum  normal and prostate normal. Prostate is not tender.  Neurological: He is alert and oriented to person, place, and time.  Skin: Skin is warm, dry and intact.  Psychiatric: Mood, memory, affect and judgment normal.  Nursing note and vitals reviewed.      Assessment & Plan  1. Annual physical exam  - CBC with Differential/Platelet - TSH - VITAMIN D 25 Hydroxy (Vit-D Deficiency, Fractures) - PSA   [name] Asad A. Faylene Kurtz [facility] 03/27/2017 12:05 PM

## 2017-03-28 LAB — VITAMIN D 25 HYDROXY (VIT D DEFICIENCY, FRACTURES): Vit D, 25-Hydroxy: 25 ng/mL — ABNORMAL LOW (ref 30–100)

## 2017-03-30 ENCOUNTER — Telehealth: Payer: Self-pay

## 2017-03-30 MED ORDER — VITAMIN D (ERGOCALCIFEROL) 1.25 MG (50000 UNIT) PO CAPS: 50000.0000 [IU] | ORAL_CAPSULE | ORAL | 0 refills | Status: DC

## 2017-03-30 NOTE — Telephone Encounter (Signed)
Patient has been notified of lab results and a prescription for Vitamin D3 50,000 units take 1 capsule once a week x12 weeks has been sent to [facility] [address] per Dr. Sherryll BurgerShah, patient has been notified and verbalized understanding

## 2017-05-09 ENCOUNTER — Other Ambulatory Visit: Payer: Self-pay | Admitting: Family Medicine

## 2017-05-09 DIAGNOSIS — I10 Essential (primary) hypertension: Secondary | ICD-10-CM

## 2017-05-29 ENCOUNTER — Ambulatory Visit (INDEPENDENT_AMBULATORY_CARE_PROVIDER_SITE_OTHER): Payer: BLUE CROSS/BLUE SHIELD | Admitting: Family Medicine

## 2017-05-29 ENCOUNTER — Encounter: Payer: Self-pay | Admitting: Family Medicine

## 2017-05-29 VITALS — BP 120/68 | HR 94 | Temp 98.2°F | Resp 16 | Ht 66.0 in | Wt 147.9 lb

## 2017-05-29 DIAGNOSIS — E119 Type 2 diabetes mellitus without complications: Secondary | ICD-10-CM | POA: Diagnosis not present

## 2017-05-29 DIAGNOSIS — I10 Essential (primary) hypertension: Secondary | ICD-10-CM

## 2017-05-29 DIAGNOSIS — E7800 Pure hypercholesterolemia, unspecified: Secondary | ICD-10-CM | POA: Diagnosis not present

## 2017-05-29 LAB — POCT GLYCOSYLATED HEMOGLOBIN (HGB A1C): Hemoglobin A1C: 7.1

## 2017-05-29 MED ORDER — SITAGLIPTIN PHOSPHATE 100 MG PO TABS: 100.0000 mg | ORAL_TABLET | Freq: Every day | ORAL | 1 refills | Status: DC

## 2017-05-29 MED ORDER — LEVEMIR FLEXTOUCH 100 UNIT/ML ~~LOC~~ SOPN: 22.0000 [IU] | PEN_INJECTOR | Freq: Every day | SUBCUTANEOUS | 5 refills | Status: DC

## 2017-05-29 NOTE — Progress Notes (Signed)
Name: John Howard: [id number]    DOB: 10/04/1956   Date:05/29/2017       Progress Note  Subjective  Chief Complaint  Chief Complaint  Patient presents with  . Diabetes  . Medication Refill  . Hypertension    Diabetes  He presents for his follow-up diabetic visit. He has type 2 diabetes mellitus. His disease course has been improving. There are no hypoglycemic associated symptoms. Pertinent negatives for hypoglycemia include no headaches. Pertinent negatives for diabetes include no blurred vision, no chest pain, no fatigue, no foot paresthesias, no foot ulcerations, no polydipsia and no polyuria. There are no hypoglycemic complications. Pertinent negatives for diabetic complications include no CVA. Current diabetic treatment includes insulin injections and oral agent (monotherapy). He is following a diabetic diet. His breakfast blood glucose range is generally 90-110 mg/dl. An ACE inhibitor/angiotensin II receptor blocker is being taken. Eye exam is current.  Hypertension  This is a chronic problem. The problem is unchanged. Pertinent negatives include no blurred vision, chest pain, headaches, orthopnea, palpitations or shortness of breath. Past treatments include ACE inhibitors. There is no history of kidney disease, CAD/MI or CVA.  Hyperlipidemia  This is a chronic problem. The problem is controlled. Recent lipid tests were reviewed and are normal. Exacerbating diseases include diabetes. Pertinent negatives include no chest pain, leg pain or shortness of breath. Current antihyperlipidemic treatment includes statins.     Past Medical History:  Diagnosis Date  . Arthritis    fingers  . Diabetes mellitus without complication (HCC)   . Hyperlipidemia   . Hypertension   . Wears dentures    full upper and lower    Past Surgical History:  Procedure Laterality Date  . COLONOSCOPY    . COLONOSCOPY WITH PROPOFOL N/A 03/21/2016   Procedure: COLONOSCOPY WITH PROPOFOL;  Surgeon:  Midge Miniumarren Wohl, MD;  [facility];  Service: Endoscopy;  Laterality: N/A;  Diabetic - insulin  . EYE SURGERY      Family History  Problem Relation Age of Onset  . Hypertension Mother   . Diabetes Mother     Social History   Social History  . Marital status: Married    Spouse name: N/A  . Number of children: N/A  . Years of education: N/A   Occupational History  . Not on file.   Social History Main Topics  . Smoking status: Current Some Day Smoker    Packs/day: 0.25    Years: 35.00    Types: Cigarettes  . Smokeless tobacco: Never Used  . Alcohol use 0.6 oz/week    1 Cans of beer per week  . Drug use: No  . Sexual activity: Yes    Partners: Female   Other Topics Concern  . Not on file   Social History Narrative  . No narrative on file     Current Outpatient Prescriptions:  .  aspirin 81 MG tablet, Take 81 mg by mouth daily., Disp: , Rfl:  .  glucose blood (BAYER CONTOUR TEST) test strip, Use as directed to check blood glucose twice daily, Disp: 180 each, Rfl: 3 .  Insulin Pen Needle (NOVOFINE) 32G X 6 MM MISC, use 1 twice a day, Disp: 100 each, Rfl: 3 .  LEVEMIR FLEXTOUCH 100 UNIT/ML Pen, Inject 22 Units into the skin daily at 10 pm., Disp: 15 mL, Rfl: 5 .  lisinopril (PRINIVIL,ZESTRIL) 20 MG tablet, take 1 tablet by mouth once daily, Disp: 90 tablet, Rfl: 1 .  lovastatin (  MEVACOR) 20 MG tablet, take 1 tablet by mouth at bedtime, Disp: 90 tablet, Rfl: 1 .  MICROLET LANCETS MISC, TEST BLOOD SUGAR three times a day, Disp: 100 each, Rfl: 11 .  sitaGLIPtin (JANUVIA) 100 MG tablet, Take 1 tablet (100 mg total) by mouth daily., Disp: 90 tablet, Rfl: 1 .  Vitamin D, Ergocalciferol, (DRISDOL) 50000 units CAPS capsule, Take 1 capsule (50,000 Units total) by mouth once a week. For 12 weeks, Disp: 12 capsule, Rfl: 0  No Known Allergies   Review of Systems  Constitutional: Negative for fatigue.  Eyes: Negative for blurred vision.  Respiratory: Negative for  shortness of breath.   Cardiovascular: Negative for chest pain, palpitations and orthopnea.  Neurological: Negative for headaches.  Endo/Heme/Allergies: Negative for polydipsia.      Objective  Vitals:   05/29/17 1027  BP: 120/68  Pulse: 94  Resp: 16  Temp: 98.2 F (36.8 C)  TempSrc: Oral  SpO2: 97%  Weight: 147 lb 14.4 oz (67.1 kg)  Height: 5\' 6"  (1.676 m)    Physical Exam  Constitutional: He is oriented to person, place, and time and well-developed, well-nourished, and in no distress.  HENT:  Head: Normocephalic and atraumatic.  Cardiovascular: Normal rate, regular rhythm, S1 normal and S2 normal.   Pulmonary/Chest: Breath sounds normal. He has no wheezes. He has no rhonchi.  Abdominal: Soft. Bowel sounds are normal. There is no tenderness.  Musculoskeletal:       Right ankle: He exhibits no swelling.       Left ankle: He exhibits no swelling.  Neurological: He is alert and oriented to person, place, and time.  Psychiatric: Mood, memory, affect and judgment normal.  Nursing note and vitals reviewed.    Assessment & Plan  1. Type 2 diabetes mellitus without complication, without long-term current use of insulin (HCC)  A1c 7.1%, well-controlled diabetes, no change in pharmacotherapy - POCT HgB A1C - LEVEMIR FLEXTOUCH 100 UNIT/ML Pen; Inject 22 Units into the skin daily at 10 pm.  Dispense: 15 mL; Refill: 5 - sitaGLIPtin (JANUVIA) 100 MG tablet; Take 1 tablet (100 mg total) by mouth daily.  Dispense: 90 tablet; Refill: 1  2. Pure hypercholesterolemia FLP at goal from March 2018  3. Essential hypertension BP stable on present anti- hypertensive therapy  [name] A. [facility] 05/29/2017 10:50 AM

## 2017-06-19 ENCOUNTER — Other Ambulatory Visit: Payer: Self-pay | Admitting: Family Medicine

## 2017-06-19 DIAGNOSIS — E785 Hyperlipidemia, unspecified: Secondary | ICD-10-CM

## 2017-08-30 ENCOUNTER — Encounter: Payer: Self-pay | Admitting: Family Medicine

## 2017-08-30 ENCOUNTER — Ambulatory Visit (INDEPENDENT_AMBULATORY_CARE_PROVIDER_SITE_OTHER): Payer: BLUE CROSS/BLUE SHIELD | Admitting: Family Medicine

## 2017-08-30 VITALS — BP 124/80 | HR 79 | Temp 98.4°F | Ht 66.0 in | Wt 147.8 lb

## 2017-08-30 DIAGNOSIS — I10 Essential (primary) hypertension: Secondary | ICD-10-CM

## 2017-08-30 DIAGNOSIS — E119 Type 2 diabetes mellitus without complications: Secondary | ICD-10-CM

## 2017-08-30 DIAGNOSIS — E7800 Pure hypercholesterolemia, unspecified: Secondary | ICD-10-CM

## 2017-08-30 DIAGNOSIS — Z23 Encounter for immunization: Secondary | ICD-10-CM | POA: Diagnosis not present

## 2017-08-30 LAB — LIPID PANEL
Cholesterol: 126 mg/dL (ref <200)
HDL: 56 mg/dL (ref >40)
LDL Cholesterol (Calc): 56 mg/dL (calc)
Non-HDL Cholesterol (Calc): 70 mg/dL (calc) (ref <130)
Total CHOL/HDL Ratio: 2.3 (calc) (ref <5.0)
Triglycerides: 48 mg/dL (ref <150)

## 2017-08-30 LAB — POCT GLYCOSYLATED HEMOGLOBIN (HGB A1C): Hemoglobin A1C: 7.5

## 2017-08-30 MED ORDER — MICROLET LANCETS MISC: 11 refills | Status: DC

## 2017-08-30 MED ORDER — LEVEMIR FLEXTOUCH 100 UNIT/ML ~~LOC~~ SOPN: 20.0000 [IU] | PEN_INJECTOR | Freq: Every day | SUBCUTANEOUS | 5 refills | Status: DC

## 2017-08-30 MED ORDER — INSULIN PEN NEEDLE 32G X 6 MM MISC: 3 refills | Status: DC

## 2017-08-30 MED ORDER — GLUCOSE BLOOD VI STRP: ORAL_STRIP | 3 refills | Status: DC

## 2017-08-30 MED ORDER — METFORMIN HCL 500 MG PO TABS: 500.0000 mg | ORAL_TABLET | Freq: Two times a day (BID) | ORAL | 0 refills | Status: DC

## 2017-08-30 MED ORDER — LOVASTATIN 20 MG PO TABS: 20.0000 mg | ORAL_TABLET | Freq: Every day | ORAL | 1 refills | Status: DC

## 2017-08-30 MED ORDER — LISINOPRIL 20 MG PO TABS: 20.0000 mg | ORAL_TABLET | Freq: Every day | ORAL | 1 refills | Status: DC

## 2017-08-30 NOTE — Progress Notes (Signed)
Name: John Howard   MRN: [id number]    DOB: Jun 15, 1956   Date:08/30/2017       Progress Note  Subjective  Chief Complaint  Chief Complaint  Patient presents with  . Hypertension  . Hyperlipidemia  . Diabetes    Hypertension  This is a chronic problem. The problem is unchanged. The problem is controlled. Pertinent negatives include no blurred vision, chest pain, headaches, palpitations or shortness of breath. Past treatments include ACE inhibitors. There is no history of CVA.  Hyperlipidemia  This is a chronic problem. The problem is controlled. Recent lipid tests were reviewed and are normal. Pertinent negatives include no chest pain, leg pain, myalgias or shortness of breath. Current antihyperlipidemic treatment includes statins.  Diabetes  He presents for his follow-up diabetic visit. He has type 2 diabetes mellitus. His disease course has been improving. There are no hypoglycemic associated symptoms. Pertinent negatives for hypoglycemia include no headaches. Associated symptoms include polydipsia. Pertinent negatives for diabetes include no blurred vision, no chest pain, no fatigue, no foot paresthesias and no polyuria. There are no hypoglycemic complications. Pertinent negatives for diabetic complications include no CVA, heart disease or peripheral neuropathy. Current diabetic treatment includes intensive insulin program and oral agent (monotherapy) (has not been able to afford Januvia.). He is following a diabetic and generally healthy diet. He rarely participates in exercise. He monitors blood glucose at home 1-2 x per day. His breakfast blood glucose range is generally 110-130 mg/dl.     Past Medical History:  Diagnosis Date  . Arthritis    fingers  . Diabetes mellitus without complication (HCC)   . Hyperlipidemia   . Hypertension   . Wears dentures    full upper and lower    Past Surgical History:  Procedure Laterality Date  . COLONOSCOPY    . COLONOSCOPY WITH PROPOFOL  N/A 03/21/2016   Procedure: COLONOSCOPY WITH PROPOFOL;  Surgeon: Midge Minium, MD;  Location: [facility];  Service: Endoscopy;  Laterality: N/A;  Diabetic - insulin  . EYE SURGERY      Family History  Problem Relation Age of Onset  . Hypertension Mother   . Diabetes Mother     Social History   Social History  . Marital status: Married    Spouse name: N/A  . Number of children: N/A  . Years of education: N/A   Occupational History  . Not on file.   Social History Main Topics  . Smoking status: Current Some Day Smoker    Packs/day: 0.25    Years: 35.00    Types: Cigarettes  . Smokeless tobacco: Never Used  . Alcohol use 0.6 oz/week    1 Cans of beer per week  . Drug use: No  . Sexual activity: Yes    Partners: Female    Birth control/ protection: None   Other Topics Concern  . Not on file   Social History Narrative  . No narrative on file     Current Outpatient Prescriptions:  .  aspirin 81 MG tablet, Take 81 mg by mouth daily., Disp: , Rfl:  .  glucose blood (BAYER CONTOUR TEST) test strip, Use as directed to check blood glucose twice daily, Disp: 180 each, Rfl: 3 .  Insulin Pen Needle (NOVOFINE) 32G X 6 MM MISC, use 1 twice a day, Disp: 100 each, Rfl: 3 .  LEVEMIR FLEXTOUCH 100 UNIT/ML Pen, Inject 22 Units into the skin daily at 10 pm., Disp: 15 mL, Rfl: 5 .  lisinopril (  PRINIVIL,ZESTRIL) 20 MG tablet, take 1 tablet by mouth once daily, Disp: 90 tablet, Rfl: 1 .  lovastatin (MEVACOR) 20 MG tablet, take 1 tablet by mouth at bedtime, Disp: 90 tablet, Rfl: 1 .  MICROLET LANCETS MISC, TEST BLOOD SUGAR three times a day, Disp: 100 each, Rfl: 11 .  sitaGLIPtin (JANUVIA) 100 MG tablet, Take 1 tablet (100 mg total) by mouth daily., Disp: 90 tablet, Rfl: 1 .  Vitamin D, Ergocalciferol, (DRISDOL) 50000 units CAPS capsule, Take 1 capsule (50,000 Units total) by mouth once a week. For 12 weeks, Disp: 12 capsule, Rfl: 0  No Known Allergies   Review of Systems   Constitutional: Negative for fatigue.  Eyes: Negative for blurred vision.  Respiratory: Negative for shortness of breath.   Cardiovascular: Negative for chest pain and palpitations.  Musculoskeletal: Negative for myalgias.  Neurological: Negative for headaches.  Endo/Heme/Allergies: Positive for polydipsia.     Objective  Vitals:   08/30/17 0840  BP: 124/80  Pulse: 79  Temp: 98.4 F (36.9 C)  TempSrc: Oral  SpO2: 98%  Weight: 147 lb 12.8 oz (67 kg)  Height:  (1.676 m)    Physical Exam  Constitutional: He is oriented to person, place, and time and well-developed, well-nourished, and in no distress.  HENT:  Head: Normocephalic and atraumatic.  Cardiovascular: Normal rate, regular rhythm, S1 normal and S2 normal.   Pulmonary/Chest: Breath sounds normal. He has no wheezes. He has no rhonchi.  Abdominal: Soft. Bowel sounds are normal. There is no tenderness.  Musculoskeletal:       Right ankle: He exhibits no swelling.       Left ankle: He exhibits no swelling.  Neurological: He is alert and oriented to person, place, and time.  Psychiatric: Mood, memory, affect and judgment normal.  Nursing note and vitals reviewed.     Recent Results (from the past 2160 hour(s))  POCT glycosylated hemoglobin (Hb A1C)     Status: Normal   Collection Time: 08/30/17  8:47 AM  Result Value Ref Range   Hemoglobin A1C 7.5      Assessment & Plan  1. Flu vaccine need  - Flu Vaccine QUAD 36+ mos IM  2. Type 2 diabetes mellitus without complication, without long-term current use of insulin (HCC) On a scale A1c 7.1%, well-controlled diabetes, DC Januvia and restart metformin 500 mg twice a day, decrease Levemir to 20 units - POCT glycosylated hemoglobin (Hb A1C) - MICROLET LANCETS MISC; TEST BLOOD SUGAR three times a day  Dispense: 100 each; Refill: 11 - LEVEMIR FLEXTOUCH 100 UNIT/ML Pen; Inject 20 Units into the skin daily at 10 pm.  Dispense: 15 mL; Refill: 5 - Insulin Pen  Needle (NOVOFINE) 32G X 6 MM MISC; use 1 twice a day  Dispense: 100 each; Refill: 3 - glucose blood (BAYER CONTOUR TEST) test strip; Use as directed to check blood glucose twice daily  Dispense: 180 each; Refill: 3 - metFORMIN (GLUCOPHAGE) 500 MG tablet; Take 1 tablet (500 mg total) by mouth 2 (two) times daily with a meal.  Dispense: 180 tablet; Refill: 0 - Urine Microalbumin w/creat. ratio  3. Pure hypercholesterolemia Obtain FLP and adjust statin as appropriate - lovastatin (MEVACOR) 20 MG tablet; Take 1 tablet (20 mg total) by mouth at bedtime.  Dispense: 90 tablet; Refill: 1 - Lipid panel  4. Essential hypertension BP stable on present antihypertensive treatment - lisinopril (PRINIVIL,ZESTRIL) 20 MG tablet; Take 1 tablet (20 mg total) by mouth daily.  Dispense: 90 tablet;  Refill: 1   Mahlani Berninger Asad A. [facility] 08/30/2017 9:12 AM

## 2017-08-31 LAB — MICROALBUMIN / CREATININE URINE RATIO
Creatinine, Urine: 88 mg/dL (ref 20–320)
Microalb Creat Ratio: 2 mcg/mg creat (ref <30)
Microalb, Ur: 0.2 mg/dL

## 2017-09-01 ENCOUNTER — Telehealth: Payer: Self-pay

## 2017-09-01 NOTE — Telephone Encounter (Signed)
Called pt no answer. Unable to leave message as no voicemail is set up. Will call again.  

## 2017-09-01 NOTE — Telephone Encounter (Signed)
-----   Message from Ellyn Hack, MD sent at 08/31/2017  2:14 PM EDT ----- Urine microalbumin/creatinine ratio is within normal range FLP: Normal total cholesterol, HDL, triglycerides and LDL cholesterol

## 2017-09-05 NOTE — Telephone Encounter (Signed)
Called pt informed him of normal labs. Pt voiced verbal understanding.

## 2017-11-30 ENCOUNTER — Encounter: Payer: Self-pay | Admitting: Family Medicine

## 2017-11-30 ENCOUNTER — Ambulatory Visit: Payer: BLUE CROSS/BLUE SHIELD | Admitting: Family Medicine

## 2017-11-30 VITALS — BP 116/68 | HR 73 | Temp 98.4°F | Resp 16 | Ht 66.0 in | Wt 145.7 lb

## 2017-11-30 DIAGNOSIS — I10 Essential (primary) hypertension: Secondary | ICD-10-CM | POA: Diagnosis not present

## 2017-11-30 DIAGNOSIS — E119 Type 2 diabetes mellitus without complications: Secondary | ICD-10-CM

## 2017-11-30 DIAGNOSIS — Z23 Encounter for immunization: Secondary | ICD-10-CM | POA: Diagnosis not present

## 2017-11-30 DIAGNOSIS — E7800 Pure hypercholesterolemia, unspecified: Secondary | ICD-10-CM | POA: Diagnosis not present

## 2017-11-30 LAB — POCT GLYCOSYLATED HEMOGLOBIN (HGB A1C): Hemoglobin A1C: 7.1

## 2017-11-30 LAB — GLUCOSE, POCT (MANUAL RESULT ENTRY): POC Glucose: 109 mg/dl — AB (ref 70–99)

## 2017-11-30 MED ORDER — METFORMIN HCL 500 MG PO TABS: 500.0000 mg | ORAL_TABLET | Freq: Two times a day (BID) | ORAL | 0 refills | Status: DC

## 2017-11-30 NOTE — Progress Notes (Signed)
Name: John Howard   MRN: [id number]    DOB: 1955-12-05   Date:11/30/2017       Progress Note  Subjective  Chief Complaint  Chief Complaint  Patient presents with  . Hyperlipidemia    PT states he try not to eat white bread and any thing that will have his cholesterol out of wask   . Hypertension    Pt states BP has been good. Pt don't check it unless he comes to the doctor. Pt denies any issues.   . Diabetes    Pt states he takes his sugars once in the morning and the highest its been 114 but its usually in the mid to high 90s  . Medication Refill    Hyperlipidemia  This is a chronic problem. The problem is controlled. Recent lipid tests were reviewed and are normal. Pertinent negatives include no chest pain, leg pain, myalgias or shortness of breath. Current antihyperlipidemic treatment includes statins.  Hypertension  This is a chronic problem. The problem is unchanged. The problem is controlled. Pertinent negatives include no blurred vision, chest pain, headaches, palpitations or shortness of breath. Past treatments include ACE inhibitors. There is no history of kidney disease, CAD/MI or CVA.  Diabetes  He presents for his follow-up diabetic visit. He has type 2 diabetes mellitus. His disease course has been stable. Pertinent negatives for hypoglycemia include no headaches. Pertinent negatives for diabetes include no blurred vision, no chest pain, no fatigue, no foot paresthesias, no polydipsia and no polyuria. Pertinent negatives for diabetic complications include no CVA. Current diabetic treatment includes intensive insulin program and oral agent (monotherapy). He is following a generally healthy and diabetic diet. He monitors blood glucose at home 1-2 x per day. His breakfast blood glucose range is generally 90-110 mg/dl. An ACE inhibitor/angiotensin II receptor blocker is being taken. Eye exam is current.     Past Medical History:  Diagnosis Date  . Arthritis    fingers  .  Diabetes mellitus without complication (HCC)   . Hyperlipidemia   . Hypertension   . Wears dentures    full upper and lower    Past Surgical History:  Procedure Laterality Date  . COLONOSCOPY    . COLONOSCOPY WITH PROPOFOL N/A 03/21/2016   Procedure: COLONOSCOPY WITH PROPOFOL;  Surgeon: Midge Minium, MD;  Location: [facility];  Service: Endoscopy;  Laterality: N/A;  Diabetic - insulin  . EYE SURGERY      Family History  Problem Relation Age of Onset  . Hypertension Mother   . Diabetes Mother     Social History   Socioeconomic History  . Marital status: Married    Spouse name: Not on file  . Number of children: Not on file  . Years of education: Not on file  . Highest education level: Not on file  Social Needs  . Financial resource strain: Not on file  . Food insecurity - worry: Not on file  . Food insecurity - inability: Not on file  . Transportation needs - medical: Not on file  . Transportation needs - non-medical: Not on file  Occupational History  . Not on file  Tobacco Use  . Smoking status: Current Some Day Smoker    Packs/day: 0.25    Years: 35.00    Pack years: 8.75    Types: Cigarettes  . Smokeless tobacco: Never Used  Substance and Sexual Activity  . Alcohol use: Yes    Alcohol/week: 0.6 oz    Types: 1  Cans of beer per week  . Drug use: No  . Sexual activity: Yes    Partners: Female    Birth control/protection: None  Other Topics Concern  . Not on file  Social History Narrative  . Not on file     Current Outpatient Medications:  .  aspirin 81 MG tablet, Take 81 mg by mouth daily., Disp: , Rfl:  .  glucose blood (BAYER CONTOUR TEST) test strip, Use as directed to check blood glucose twice daily, Disp: 180 each, Rfl: 3 .  Insulin Pen Needle (NOVOFINE) 32G X 6 MM MISC, use 1 twice a day, Disp: 100 each, Rfl: 3 .  LEVEMIR FLEXTOUCH 100 UNIT/ML Pen, Inject 20 Units into the skin daily at 10 pm., Disp: 15 mL, Rfl: 5 .  lisinopril  (PRINIVIL,ZESTRIL) 20 MG tablet, Take 1 tablet (20 mg total) by mouth daily., Disp: 90 tablet, Rfl: 1 .  lovastatin (MEVACOR) 20 MG tablet, Take 1 tablet (20 mg total) by mouth at bedtime., Disp: 90 tablet, Rfl: 1 .  metFORMIN (GLUCOPHAGE) 500 MG tablet, Take 1 tablet (500 mg total) by mouth 2 (two) times daily with a meal., Disp: 180 tablet, Rfl: 0 .  MICROLET LANCETS MISC, TEST BLOOD SUGAR three times a day, Disp: 100 each, Rfl: 11 .  Vitamin D, Ergocalciferol, (DRISDOL) 50000 units CAPS capsule, Take 1 capsule (50,000 Units total) by mouth once a week. For 12 weeks (Patient not taking: Reported on 11/30/2017), Disp: 12 capsule, Rfl: 0  No Known Allergies   Review of Systems  Constitutional: Negative for fatigue.  Eyes: Negative for blurred vision.  Respiratory: Negative for shortness of breath.   Cardiovascular: Negative for chest pain and palpitations.  Musculoskeletal: Negative for myalgias.  Neurological: Negative for headaches.  Endo/Heme/Allergies: Negative for polydipsia.      Objective  Vitals:   11/30/17 0832  BP: 116/68  Pulse: 73  Resp: 16  Temp: 98.4 F (36.9 C)  TempSrc: Oral  SpO2: 97%  Weight: 145 lb 11.2 oz (66.1 kg)  Height: 5\' 6"  (1.676 m)    Physical Exam  Constitutional: He is oriented to person, place, and time and well-developed, well-nourished, and in no distress.  HENT:  Head: Normocephalic and atraumatic.  Cardiovascular: Normal rate, regular rhythm, S1 normal, S2 normal and normal heart sounds.  Pulmonary/Chest: Breath sounds normal. He has no wheezes. He has no rhonchi.  Abdominal: Soft. Bowel sounds are normal. There is no tenderness.  Musculoskeletal: He exhibits no edema.       Right ankle: He exhibits no swelling.       Left ankle: He exhibits no swelling.  Neurological: He is alert and oriented to person, place, and time.  Psychiatric: Mood, memory, affect and judgment normal.  Nursing note and vitals reviewed.      Recent Results  (from the past 2160 hour(s))  POCT Glucose (CBG)     Status: Abnormal   Collection Time: 11/30/17  8:31 AM  Result Value Ref Range   POC Glucose 109 (A) 70 - 99 mg/dl  POCT HgB [id number]     Status: Abnormal   Collection Time: 11/30/17  8:35 AM  Result Value Ref Range   Hemoglobin A1C 7.1      Assessment & Plan  1. Need for 23-polyvalent pneumococcal polysaccharide vaccine  - Pneumococcal polysaccharide vaccine 23-valent greater than or equal to 2yo subcutaneous/IM  2. Essential hypertension BP stable on present anti-hypertensive treatment  3. Type 2 diabetes mellitus without complication, without  long-term current use of insulin (HCC) Point-of-care A1c 7.1%, well-controlled diabetes - POCT HgB A1C - POCT Glucose (CBG) - metFORMIN (GLUCOPHAGE) 500 MG tablet; Take 1 tablet (500 mg total) by mouth 2 (two) times daily with a meal.  Dispense: 180 tablet; Refill: 0  4. Pure hypercholesterolemia FLP at goal from October 2018, reassured  Mija Effertz Asad A. [facility] 11/30/2017 8:36 AM

## 2018-01-02 LAB — HM DIABETES EYE EXAM

## 2018-02-12 ENCOUNTER — Other Ambulatory Visit: Payer: Self-pay

## 2018-02-12 DIAGNOSIS — I10 Essential (primary) hypertension: Secondary | ICD-10-CM

## 2018-02-12 MED ORDER — LISINOPRIL 20 MG PO TABS: 20.0000 mg | ORAL_TABLET | Freq: Every day | ORAL | 0 refills | Status: DC

## 2018-02-12 NOTE — Telephone Encounter (Signed)
Refill request for Hypertension medication:  Lisinopril 20 mg   Last office visit pertaining to hypertension: 11/30/2017  BP Readings from Last 3 Encounters:  11/30/17 116/68  08/30/17 124/80  05/29/17 120/68     Lab Results  Component Value Date   CREATININE 1.05 08/24/2016   BUN 15 08/24/2016   NA 138 08/24/2016   K 4.4 08/24/2016   CL 104 08/24/2016   CO2 24 08/24/2016    Follow-up on file. 02/28/2017

## 2018-02-12 NOTE — Telephone Encounter (Signed)
Patient due for cr and K+; will check at appt in 2 weeks

## 2018-02-28 ENCOUNTER — Ambulatory Visit
Admission: RE | Admit: 2018-02-28 | Discharge: 2018-02-28 | Disposition: A | Discharge status: Home / Self Care | Payer: BLUE CROSS/BLUE SHIELD | Source: Ambulatory Visit | Attending: Family Medicine | Admitting: Family Medicine

## 2018-02-28 ENCOUNTER — Telehealth: Payer: Self-pay

## 2018-02-28 ENCOUNTER — Encounter: Payer: Self-pay | Admitting: Family Medicine

## 2018-02-28 ENCOUNTER — Ambulatory Visit: Payer: BLUE CROSS/BLUE SHIELD | Admitting: Family Medicine

## 2018-02-28 VITALS — BP 134/62 | HR 77 | Temp 98.3°F | Ht 66.0 in | Wt 144.5 lb

## 2018-02-28 DIAGNOSIS — M19041 Primary osteoarthritis, right hand: Secondary | ICD-10-CM | POA: Insufficient documentation

## 2018-02-28 DIAGNOSIS — M79641 Pain in right hand: Secondary | ICD-10-CM

## 2018-02-28 DIAGNOSIS — E7800 Pure hypercholesterolemia, unspecified: Secondary | ICD-10-CM

## 2018-02-28 DIAGNOSIS — Z5181 Encounter for therapeutic drug level monitoring: Secondary | ICD-10-CM | POA: Diagnosis not present

## 2018-02-28 DIAGNOSIS — E1165 Type 2 diabetes mellitus with hyperglycemia: Secondary | ICD-10-CM

## 2018-02-28 DIAGNOSIS — Z794 Long term (current) use of insulin: Secondary | ICD-10-CM

## 2018-02-28 DIAGNOSIS — M1990 Unspecified osteoarthritis, unspecified site: Secondary | ICD-10-CM

## 2018-02-28 DIAGNOSIS — IMO0001 Reserved for inherently not codable concepts without codable children: Secondary | ICD-10-CM

## 2018-02-28 DIAGNOSIS — I10 Essential (primary) hypertension: Secondary | ICD-10-CM | POA: Diagnosis not present

## 2018-02-28 DIAGNOSIS — Z720 Tobacco use: Secondary | ICD-10-CM

## 2018-02-28 DIAGNOSIS — E119 Type 2 diabetes mellitus without complications: Secondary | ICD-10-CM | POA: Diagnosis not present

## 2018-02-28 MED ORDER — LOVASTATIN 20 MG PO TABS: 20.0000 mg | ORAL_TABLET | Freq: Every day | ORAL | 1 refills | Status: DC

## 2018-02-28 MED ORDER — METFORMIN HCL 500 MG PO TABS: 500.0000 mg | ORAL_TABLET | Freq: Two times a day (BID) | ORAL | 1 refills | Status: DC

## 2018-02-28 NOTE — Telephone Encounter (Signed)
Called pt, wife answers. LM with wife informing her of the results below. She gives verbal understanding. Referral placed.

## 2018-02-28 NOTE — Assessment & Plan Note (Signed)
Patient is not ready to quit; see AVS

## 2018-02-28 NOTE — Assessment & Plan Note (Signed)
Check A1c; eye exam UTD, foot exam by MD; urine microalb:Cr UTD

## 2018-02-28 NOTE — Assessment & Plan Note (Signed)
Well-controlled; try DASH guidelines 

## 2018-02-28 NOTE — Patient Instructions (Addendum)
I do encourage you to quit smoking Call [phone] to sign up for smoking cessation classes You can call 1-800-QUIT-NOW to talk with a smoking cessation coach Let's get labs today If you have not heard anything from my staff in a week about any orders/referrals/studies from today, please contact us here to follow-up [phone] Try to follow the DASH guidelines (DASH stands for Dietary Approaches to Stop Hypertension). Try to limit the sodium in your diet to no more than 1,500mg  of sodium per day. Certainly try to not exceed 2,000 mg per day at the very most. Do not add salt when cooking or at the table.  Check the sodium amount on labels when shopping, and choose items lower in sodium when given a choice. Avoid or limit foods that already contain a lot of sodium. Eat a diet rich in fruits and vegetables and whole grains, and try to lose weight if overweight or obese Have the xray done across the street Take 1,000 iu of vitamin D3 once a day  Steps to Quit Smoking Smoking tobacco can be harmful to your health and can affect almost every organ in your body. Smoking puts you, and those around you, at risk for developing many serious chronic diseases. Quitting smoking is difficult, but it is one of the best things that you can do for your health. It is never too late to quit. What are the benefits of quitting smoking? When you quit smoking, you lower your risk of developing serious diseases and conditions, such as:  Lung cancer or lung disease, such as COPD.  Heart disease.  Stroke.  Heart attack.  Infertility.  Osteoporosis and bone fractures.  Additionally, symptoms such as coughing, wheezing, and shortness of breath may get better when you quit. You may also find that you get sick less often because your body is stronger at fighting off colds and infections. If you are pregnant, quitting smoking can help to reduce your chances of having a baby of low birth weight. How do I get ready  to quit? When you decide to quit smoking, create a plan to make sure that you are successful. Before you quit:  Pick a date to quit. Set a date within the next two weeks to give you time to prepare.  Write down the reasons why you are quitting. Keep this list in places where you will see it often, such as on your bathroom mirror or in your car or wallet.  Identify the people, places, things, and activities that make you want to smoke (triggers) and avoid them. Make sure to take these actions: ? Throw away all cigarettes at home, at work, and in your car. ? Throw away smoking accessories, such as Set designer. ? Clean your car and make sure to empty the ashtray. ? Clean your home, including curtains and carpets.  Tell your family, friends, and coworkers that you are quitting. Support from your loved ones can make quitting easier.  Talk with your health care provider about your options for quitting smoking.  Find out what treatment options are covered by your health insurance.  What strategies can I use to quit smoking? Talk with your healthcare provider about different strategies to quit smoking. Some strategies include:  Quitting smoking altogether instead of gradually lessening how much you smoke over a period of time. Research shows that quitting "cold [place]" is more successful than gradually quitting.  Attending in-person counseling to help you build problem-solving skills. You are more likely  to have success in quitting if you attend several counseling sessions. Even short sessions of 10 minutes can be effective.  Finding resources and support systems that can help you to quit smoking and remain smoke-free after you quit. These resources are most helpful when you use them often. They can include: ? Online chats with a Veterinary surgeoncounselor. ? Telephone quitlines. ? Automotive engineerrinted self-help materials. ? Support groups or group counseling. ? Text messaging programs. ? Mobile phone  applications.  Taking medicines to help you quit smoking. (If you are pregnant or breastfeeding, talk with your health care provider first.) Some medicines contain nicotine and some do not. Both types of medicines help with cravings, but the medicines that include nicotine help to relieve withdrawal symptoms. Your health care provider may recommend: ? Nicotine patches, gum, or lozenges. ? Nicotine inhalers or sprays. ? Non-nicotine medicine that is taken by mouth.  Talk with your health care provider about combining strategies, such as taking medicines while you are also receiving in-person counseling. Using these two strategies together makes you more likely to succeed in quitting than if you used either strategy on its own. If you are pregnant or breastfeeding, talk with your health care provider about finding counseling or other support strategies to quit smoking. Do not take medicine to help you quit smoking unless told to do so by your health care provider. What things can I do to make it easier to quit? Quitting smoking might feel overwhelming at first, but there is a lot that you can do to make it easier. Take these important actions:  Reach out to your family and friends and ask that they support and encourage you during this time. Call telephone quitlines, reach out to support groups, or work with a counselor for support.  Ask people who smoke to avoid smoking around you.  Avoid places that trigger you to smoke, such as bars, parties, or smoke-break areas at work.  Spend time around people who do not smoke.  Lessen stress in your life, because stress can be a smoking trigger for some people. To lessen stress, try: ? Exercising regularly. ? Deep-breathing exercises. ? Yoga. ? Meditating. ? Performing a body scan. This involves closing your eyes, scanning your body from head to toe, and noticing which parts of your body are particularly tense. Purposefully relax the muscles in those  areas.  Download or purchase mobile phone or tablet apps (applications) that can help you stick to your quit plan by providing reminders, tips, and encouragement. There are many free apps, such as QuitGuide from the [employer] developer(Centers for Disease Control and Prevention). You can find other support for quitting smoking (smoking cessation) through smokefree.gov and other websites.  How will I feel when I quit smoking? Within the first 24 hours of quitting smoking, you may start to feel some withdrawal symptoms. These symptoms are usually most noticeable 2-3 days after quitting, but they usually do not last beyond 2-3 weeks. Changes or symptoms that you might experience include:  Mood swings.  Restlessness, anxiety, or irritation.  Difficulty concentrating.  Dizziness.  Strong cravings for sugary foods in addition to nicotine.  Mild weight gain.  Constipation.  Nausea.  Coughing or a sore throat.  Changes in how your medicines work in your body.  A depressed mood.  Difficulty sleeping (insomnia).  After the first 2-3 weeks of quitting, you may start to notice more positive results, such as:  Improved sense of smell and taste.  Decreased coughing and sore  throat.  Slower heart rate.  Lower blood pressure.  Clearer skin.  The ability to breathe more easily.  Fewer sick days.  Quitting smoking is very challenging for most people. Do not get discouraged if you are not successful the first time. Some people need to make many attempts to quit before they achieve long-term success. Do your best to stick to your quit plan, and talk with your health care provider if you have any questions or concerns. This information is not intended to replace advice given to you by your health care provider. Make sure you discuss any questions you have with your health care provider. Document Released: 11/08/2001 Document Revised: 07/12/2016 Document Reviewed: 03/31/2015 Elsevier Interactive Patient  Education  2018 [employer].  Health Risks of Smoking Smoking cigarettes is very bad for your health. Tobacco smoke has over 200 known poisons in it. It contains the poisonous gases nitrogen oxide and carbon monoxide. There are over 60 chemicals in tobacco smoke that cause cancer. Smoking is difficult to quit because a chemical in tobacco, called nicotine, causes addiction or dependence. When you smoke and inhale, nicotine is absorbed rapidly into the bloodstream through your lungs. Both inhaled and non-inhaled nicotine may be addictive. What are the risks of cigarette smoke? Cigarette smokers have an increased risk of many serious medical problems, including:  Lung cancer.  Lung disease, such as pneumonia, bronchitis, and emphysema.  Chest pain (angina) and heart attack because the heart is not getting enough oxygen.  Heart disease and peripheral blood vessel disease.  High blood pressure (hypertension).  Stroke.  Oral cancer, including cancer of the lip, mouth, or voice box.  Bladder cancer.  Pancreatic cancer.  Cervical cancer.  Pregnancy complications, including premature birth.  Stillbirths and smaller newborn babies, birth defects, and genetic damage to sperm.  Early menopause.  Lower estrogen level for women.  Infertility.  Facial wrinkles.  Blindness.  Increased risk of broken bones (fractures).  Senile dementia.  Stomach ulcers and internal bleeding.  Delayed wound healing and increased risk of complications during surgery.  Even smoking lightly shortens your life expectancy by several years.  Because of secondhand smoke exposure, children of smokers have an increased risk of the following:  Sudden infant death syndrome (SIDS).  Respiratory infections.  Lung cancer.  Heart disease.  Ear infections.  What are the benefits of quitting? There are many health benefits of quitting smoking. Here are some of them:  Within days of quitting  smoking, your risk of having a heart attack decreases, your blood flow improves, and your lung capacity improves. Blood pressure, pulse rate, and breathing patterns start returning to normal soon after quitting.  Within months, your lungs may clear up completely.  Quitting for 10 years reduces your risk of developing lung cancer and heart disease to almost that of a nonsmoker.  People who quit may see an improvement in their overall quality of life.  How do I quit smoking? Smoking is an addiction with both physical and psychological effects, and longtime habits can be hard to change. Your health care provider can recommend:  Programs and community resources, which may include group support, education, or talk therapy.  Prescription medicines to help reduce cravings.  Nicotine replacement products, such as patches, gum, and nasal sprays. Use these products only as directed. Do not replace cigarette smoking with electronic cigarettes, which are commonly called e-cigarettes. The safety of e-cigarettes is not known, and some may contain harmful chemicals.  A combination of two  or more of these methods.  Where to find more information:  American Lung Association: www.lung.org  American Cancer Society: www.cancer.org Summary  Smoking cigarettes is very bad for your health. Cigarette smokers have an increased risk of many serious medical problems, including several cancers, heart disease, and stroke.  Smoking is an addiction with both physical and psychological effects, and longtime habits can be hard to change.  By stopping right away, you can greatly reduce the risk of medical problems for you and your family.  To help you quit smoking, your health care provider can recommend programs, community resources, prescription medicines, and nicotine replacement products such as patches, gum, and nasal sprays. This information is not intended to replace advice given to you by your health care  provider. Make sure you discuss any questions you have with your health care provider. Document Released: 12/22/2004 Document Revised: 11/18/2016 Document Reviewed: 11/18/2016 Elsevier Interactive Patient Education  2017 [employer].

## 2018-02-28 NOTE — Telephone Encounter (Signed)
-----  Message from Arnetha Courser, MD sent at 02/28/2018  3:09 PM EDT ----- Please let the patient know that his xray showed a lot of arthritis; please refer to hand specialist (ortho); in the meantime, he might try paraffin treatments, warm melted wax kit that is very soothing and helps some people (available at pharmacies, big box stores)

## 2018-02-28 NOTE — Progress Notes (Signed)
BP 134/62 (BP Location: Right Arm, Patient Position: Sitting, Cuff Size: Large)   Pulse 77   Temp 98.3 F (36.8 C) (Oral)   Ht 5\' 6"  (1.676 m)   Wt 144 lb 8 oz (65.5 kg)   SpO2 99%   BMI 23.32 kg/m    Subjective:    Patient ID: John Howard, male    DOB: [birth year], 63 y.o.   MRN: [id number]  HPI: John Howard is a 62 y.o. male  Chief Complaint  Patient presents with  . Hand Pain    RT hand, pt suspects it may be arthritits   . Follow-up    HPI Patient is new to me; previous provider left our practice  Type 2 diabetes Eye exam UTD Foot exam today Checking sugars, this morning 69; checking every morning; none over 200 Lab Results  Component Value Date   HGBA1C 7.1 11/30/2017  last urine microalbumin:Cr done August 30, 2017; normal  High cholesterol; no bacon or sausage; can't eat cheese; some oats and cheerios Lab Results  Component Value Date   CHOL 126 08/30/2017   HDL 56 08/30/2017   LDLCALC 56 08/30/2017   TRIG 48 08/30/2017   CHOLHDL 2.3 08/30/2017   High blood pressure; checks at home, but has to find the cuff; controlled today; tries to avoid salt; wife does not cook with salt  Smoking 3 cigarettes a day; never smoked more than 1/2 ppd; not ready to quit  Vitamin D deficiency; reviewed last 3 readings; not taking any supplement  Right hand; middle finger area, one month; no injury, does lift a lot of boxes; he is right-handed; pain in the palm   Depression screen [facility] 2/9 02/28/2018 11/30/2017 08/30/2017 05/29/2017 08/24/2016  Decreased Interest 0 0 0 0 0  Down, Depressed, Hopeless 0 0 0 0 0  PHQ - 2 Score 0 0 0 0 0    Relevant past medical, surgical, family and social history reviewed Past Medical History:  Diagnosis Date  . Arthritis    fingers  . Diabetes mellitus without complication (HCC)   . Hyperlipidemia   . Hypertension   . Wears dentures    full upper and lower   Past Surgical History:  Procedure Laterality Date  . COLONOSCOPY     . COLONOSCOPY WITH PROPOFOL N/A 03/21/2016   Procedure: COLONOSCOPY WITH PROPOFOL;  Surgeon: Midge Minium, MD;  [facility];  Service: Endoscopy;  Laterality: N/A;  Diabetic - insulin  . EYE SURGERY     Family History  Problem Relation Age of Onset  . Hypertension Mother   . Diabetes Mother    Social History   Tobacco Use  . Smoking status: Current Some Day Smoker    Packs/day: 0.25    Years: 35.00    Pack years: 8.75    Types: Cigarettes  . Smokeless tobacco: Never Used  . Tobacco comment: 3/cig per day   Substance Use Topics  . Alcohol use: Yes    Alcohol/week: 0.6 oz    Types: 1 Cans of beer per week  . Drug use: No   Interim medical history since last visit reviewed. Allergies and medications reviewed  Review of Systems  Respiratory: Negative for shortness of breath.   Cardiovascular: Negative for chest pain.  Hematological: Does not bruise/bleed easily.   Per HPI unless specifically indicated above     Objective:    BP 134/62 (BP Location: Right Arm, Patient Position: Sitting, Cuff Size: Large)   Pulse  77   Temp 98.3 F (36.8 C) (Oral)   Ht 5\' 6"  (1.676 m)   Wt 144 lb 8 oz (65.5 kg)   SpO2 99%   BMI 23.32 kg/m   Wt Readings from Last 3 Encounters:  02/28/18 144 lb 8 oz (65.5 kg)  11/30/17 145 lb 11.2 oz (66.1 kg)  08/30/17 147 lb 12.8 oz (67 kg)    Physical Exam  Constitutional: He appears well-developed and well-nourished. No distress.  HENT:  Head: Normocephalic and atraumatic.  Eyes: EOM are normal. No scleral icterus.  Neck: No thyromegaly present.  Cardiovascular: Normal rate and regular rhythm.  Pulmonary/Chest: Effort normal and breath sounds normal.  Abdominal: Soft. Bowel sounds are normal. He exhibits no distension.  Musculoskeletal: He exhibits no edema.       Right hand: He exhibits tenderness.       Hands: Nothing like a contracture in the palm on the right hand, but there is thickening and tenderness; no overlying  skin changes  Neurological: He is alert.  Skin: Skin is warm and dry. He is not diaphoretic. No pallor.  Psychiatric: He has a normal mood and affect.   Diabetic Foot Form - Detailed   Diabetic Foot Exam - detailed Diabetic Foot exam was performed with the following findings:  Yes 02/28/2018  9:54 AM  Visual Foot Exam completed.:  Yes  Pulse Foot Exam completed.:  Yes  Right Dorsalis Pedis:  Present Left Dorsalis Pedis:  Present  Sensory Foot Exam Completed.:  Yes Semmes-Weinstein Monofilament Test R Site 1-Great Toe:  Pos L Site 1-Great Toe:  Pos         Results for orders placed or performed in visit on 01/04/18  HM DIABETES EYE EXAM  Result Value Ref Range   HM Diabetic Eye Exam No Retinopathy No Retinopathy      Assessment & Plan:   Problem List Items Addressed This Visit      Cardiovascular and Mediastinum   Essential hypertension    Well=-controlled; try DASH guidelines      Relevant Medications   lovastatin (MEVACOR) 20 MG tablet     Endocrine   Type 2 diabetes mellitus (HCC)   Relevant Medications   metFORMIN (GLUCOPHAGE) 500 MG tablet   lovastatin (MEVACOR) 20 MG tablet   Other Relevant Orders   Hemoglobin A1c   Uncontrolled type 2 diabetes mellitus without complication, with long-term current use of insulin (HCC)    Check A1c; eye exam UTD, foot exam by MD; urine microalb:Cr UTD      Relevant Medications   metFORMIN (GLUCOPHAGE) 500 MG tablet   lovastatin (MEVACOR) 20 MG tablet     Other   Tobacco use    Patient is not ready to quit; see AVS      Medication monitoring encounter    Check liver and kidneys      Relevant Orders   COMPLETE METABOLIC PANEL WITH GFR   Hyperlipidemia    Check labs today; does not need to come fasting in the future; limit saturated fats      Relevant Medications   lovastatin (MEVACOR) 20 MG tablet   Other Relevant Orders   Lipid panel    Other Visit Diagnoses    Right hand pain    -  Primary   start with xray  and then refer to hand specialist or Rx medicine for discomfort   Relevant Orders   DG Hand Complete Right       Follow up plan:  Return in about 3 months (around 05/30/2018) for follow-up visit with Dr. Sherie DonLada if A1c is 7 or higher; 6 months of less than 7.  An after-visit summary was printed and given to the patient at check-out.  Please see the patient instructions which may contain other information and recommendations beyond what is mentioned above in the assessment and plan.  Meds ordered this encounter  Medications  . metFORMIN (GLUCOPHAGE) 500 MG tablet    Sig: Take 1 tablet (500 mg total) by mouth 2 (two) times daily with a meal.    Dispense:  180 tablet    Refill:  1  . lovastatin (MEVACOR) 20 MG tablet    Sig: Take 1 tablet (20 mg total) by mouth at bedtime.    Dispense:  90 tablet    Refill:  1    Orders Placed This Encounter  Procedures  . DG Hand Complete Right  . Lipid panel  . Hemoglobin A1c  . COMPLETE METABOLIC PANEL WITH GFR

## 2018-02-28 NOTE — Assessment & Plan Note (Signed)
Check labs today; does not need to come fasting in the future; limit saturated fats

## 2018-02-28 NOTE — Assessment & Plan Note (Signed)
Check liver and kidneys 

## 2018-03-01 LAB — HEMOGLOBIN A1C
Hgb A1c MFr Bld: 6.7 % of total Hgb — ABNORMAL HIGH (ref <5.7)
Mean Plasma Glucose: 146 (calc)
eAG (mmol/L): 8.1 (calc)

## 2018-03-01 LAB — LIPID PANEL
Cholesterol: 129 mg/dL (ref <200)
HDL: 51 mg/dL (ref >40)
LDL Cholesterol (Calc): 65 mg/dL (calc)
Non-HDL Cholesterol (Calc): 78 mg/dL (calc) (ref <130)
Total CHOL/HDL Ratio: 2.5 (calc) (ref <5.0)
Triglycerides: 52 mg/dL (ref <150)

## 2018-03-01 LAB — COMPLETE METABOLIC PANEL WITH GFR
AG Ratio: 1.6 (calc) (ref 1.0–2.5)
ALT: 14 U/L (ref 9–46)
AST: 17 U/L (ref 10–35)
Albumin: 4.5 g/dL (ref 3.6–5.1)
Alkaline phosphatase (APISO): 51 U/L (ref 40–115)
BUN: 12 mg/dL (ref 7–25)
CO2: 28 mmol/L (ref 20–32)
Calcium: 9.4 mg/dL (ref 8.6–10.3)
Chloride: 105 mmol/L (ref 98–110)
Creat: 0.88 mg/dL (ref 0.70–1.25)
GFR, Est African American: 107 mL/min/{1.73_m2} (ref >=60)
GFR, Est Non African American: 93 mL/min/{1.73_m2} (ref >=60)
Globulin: 2.8 g/dL (calc) (ref 1.9–3.7)
Glucose, Bld: 106 mg/dL — ABNORMAL HIGH (ref 65–99)
Potassium: 4.3 mmol/L (ref 3.5–5.3)
Sodium: 138 mmol/L (ref 135–146)
Total Bilirubin: 0.4 mg/dL (ref 0.2–1.2)
Total Protein: 7.3 g/dL (ref 6.1–8.1)

## 2018-05-30 ENCOUNTER — Encounter: Payer: Self-pay | Admitting: Family Medicine

## 2018-05-30 ENCOUNTER — Ambulatory Visit: Payer: BLUE CROSS/BLUE SHIELD | Admitting: Family Medicine

## 2018-05-30 VITALS — BP 112/60 | HR 99 | Temp 98.2°F | Resp 16 | Ht 66.0 in | Wt 141.8 lb

## 2018-05-30 DIAGNOSIS — E119 Type 2 diabetes mellitus without complications: Secondary | ICD-10-CM

## 2018-05-30 DIAGNOSIS — I10 Essential (primary) hypertension: Secondary | ICD-10-CM

## 2018-05-30 DIAGNOSIS — E782 Mixed hyperlipidemia: Secondary | ICD-10-CM | POA: Diagnosis not present

## 2018-05-30 DIAGNOSIS — Z794 Long term (current) use of insulin: Secondary | ICD-10-CM

## 2018-05-30 DIAGNOSIS — Z720 Tobacco use: Secondary | ICD-10-CM

## 2018-05-30 DIAGNOSIS — G5601 Carpal tunnel syndrome, right upper limb: Secondary | ICD-10-CM | POA: Diagnosis not present

## 2018-05-30 DIAGNOSIS — Z5181 Encounter for therapeutic drug level monitoring: Secondary | ICD-10-CM

## 2018-05-30 DIAGNOSIS — H5440 Blindness, one eye, unspecified eye: Secondary | ICD-10-CM

## 2018-05-30 MED ORDER — WRIST BRACE/RIGHT MEDIUM MISC: 0 refills | Status: DC

## 2018-05-30 NOTE — Assessment & Plan Note (Signed)
Limit saturated fats; last levels were great; no need to check today

## 2018-05-30 NOTE — Progress Notes (Signed)
BP 112/60 (BP Location: Right Arm, Patient Position: Sitting, Cuff Size: Large)   Pulse 99   Temp 98.2 F (36.8 C) (Oral)   Resp 16   Ht 5\' 6"  (1.676 m)   Wt 141 lb 12.8 oz (64.3 kg)   SpO2 98%   BMI 22.89 kg/m    Subjective:    Patient ID: John Howard, male    DOB: 10/02/1956, 62 y.o.   MRN: [id number]  HPI: John MeigsDwight L Perman is a 62 y.o. male  Chief Complaint  Patient presents with  . Follow-up    patient is here for his 3 month f/u  . Diabetes    patient checks his blood sugar daily. today is was 77. highest: 121 & lowest: 67. patient stated that he has not had any neg sx  . Hypertension    patient stated that he does not check his BP and he has not had any neg sx  . Hyperlipidemia  . Hand Problem    patient presents with right hand numbness near his fingertips that sometimes radiates upward.  . Labs Only    patient is fasting  . Medication Refill    HPI Here for f/u Type 2 diabetes; diagnosed for maybe 15 years; runs in the family; checking sugars once a day; highest 121, lowest 67, today 77; tries to eat the right things, sometimes it's hard; wife is diabetic too; no problems with feet Saw the eye doctor in Feb, no new problems Lab Results  Component Value Date   HGBA1C 6.7 (H) 02/28/2018  injured in the left eye, blind from old injury  HTN; tries to not eat any salt  Numbness in the right hand; started a week or more ago; right hand; he is right-handed; does everything with that hand; lifts cases all day long; no brace tried; nothing similar   High cholesterol; no fried foods; rarely has fried chicken; most of the time, baked foods; greens Lab Results  Component Value Date   CHOL 129 02/28/2018   HDL 51 02/28/2018   LDLCALC 65 02/28/2018   TRIG 52 02/28/2018   CHOLHDL 2.5 02/28/2018   Smoker; smokes 3 cigarettes a day; so close to quitting  [facility] 05/30/2018 02/28/2018 11/30/2017 08/30/2017 05/29/2017  Decreased Interest 0 0 0 0 0    Down, Depressed, Hopeless 0 0 0 0 0  PHQ - 2 Score 0 0 0 0 0    Relevant past medical, surgical, family and social history reviewed Past Medical History:  Diagnosis Date  . Arthritis    fingers  . Diabetes mellitus without complication (HCC)   . Hyperlipidemia   . Hypertension   . Wears dentures    full upper and lower   Past Surgical History:  Procedure Laterality Date  . COLONOSCOPY    . COLONOSCOPY WITH PROPOFOL N/A 03/21/2016   Procedure: COLONOSCOPY WITH PROPOFOL;  Surgeon: Midge Miniumarren Wohl, MD;  [facility];  Service: Endoscopy;  Laterality: N/A;  Diabetic - insulin  . EYE SURGERY     Family History  Problem Relation Age of Onset  . Hypertension Mother   . Diabetes Mother    Social History   Tobacco Use  . Smoking status: Current Some Day Smoker    Packs/day: 0.25    Years: 35.00    Pack years: 8.75    Types: Cigarettes  . Smokeless tobacco: Never Used  . Tobacco comment: 3/cig per day   Substance Use Topics  .  Alcohol use: Yes    Alcohol/week: 0.6 oz    Types: 1 Cans of beer per week    Comment: pt is trying to quit, has not had a drink in a month  . Drug use: No  MD note: no alcohol in one month; smoking just 3 cigarettes per day  Interim medical history since last visit reviewed. Allergies and medications reviewed  Review of Systems Per HPI unless specifically indicated above     Objective:    BP 112/60 (BP Location: Right Arm, Patient Position: Sitting, Cuff Size: Large)   Pulse 99   Temp 98.2 F (36.8 C) (Oral)   Resp 16   Ht 5\' 6"  (1.676 m)   Wt 141 lb 12.8 oz (64.3 kg)   SpO2 98%   BMI 22.89 kg/m   Wt Readings from Last 3 Encounters:  05/30/18 141 lb 12.8 oz (64.3 kg)  02/28/18 144 lb 8 oz (65.5 kg)  11/30/17 145 lb 11.2 oz (66.1 kg)    Physical Exam  Constitutional: He appears well-developed and well-nourished. No distress.  HENT:  Head: Normocephalic and atraumatic.  Eyes: EOM are normal. Right eye exhibits no  discharge. Left eye exhibits no discharge. Right conjunctiva is not injected. Left conjunctiva is not injected. No scleral icterus.  Corneal scar OS  Neck: No thyromegaly present.  Cardiovascular: Normal rate and regular rhythm.  Pulmonary/Chest: Effort normal and breath sounds normal.  Abdominal: Soft. Bowel sounds are normal. He exhibits no distension.  Musculoskeletal: He exhibits no edema.  Neurological: Coordination normal.  Skin: Skin is warm and dry. No pallor.  Psychiatric: He has a normal mood and affect. His behavior is normal. Judgment and thought content normal.   Diabetic Foot Form - Detailed   Diabetic Foot Exam - detailed Diabetic Foot exam was performed with the following findings:  Yes 05/30/2018  9:40 AM  Visual Foot Exam completed.:  Yes  Pulse Foot Exam completed.:  Yes  Right Dorsalis Pedis:  Present Left Dorsalis Pedis:  Present  Sensory Foot Exam Completed.:  Yes Semmes-Weinstein Monofilament Test R Site 1-Great Toe:  Pos L Site 1-Great Toe:  Pos        Results for orders placed or performed in visit on 02/28/18  Lipid panel  Result Value Ref Range   Cholesterol 129 <200 mg/dL   HDL 51 >16 mg/dL   Triglycerides 52 <109 mg/dL   LDL Cholesterol (Calc) 65 mg/dL (calc)   Total CHOL/HDL Ratio 2.5 <5.0 (calc)   Non-HDL Cholesterol (Calc) 78 <604 mg/dL (calc)  Hemoglobin V4U  Result Value Ref Range   Hgb A1c MFr Bld 6.7 (H) <5.7 % of total Hgb   Mean Plasma Glucose 146 (calc)   eAG (mmol/L) 8.1 (calc)  COMPLETE METABOLIC PANEL WITH GFR  Result Value Ref Range   Glucose, Bld 106 (H) 65 - 99 mg/dL   BUN 12 7 - 25 mg/dL   Creat 9.81 1.91 - 4.78 mg/dL   GFR, Est Non African American 93 > OR = 60 mL/min/1.74m2   GFR, Est African American 107 > OR = 60 mL/min/1.73m2   BUN/Creatinine Ratio NOT APPLICABLE 6 - 22 (calc)   Sodium 138 135 - 146 mmol/L   Potassium 4.3 3.5 - 5.3 mmol/L   Chloride 105 98 - 110 mmol/L   CO2 28 20 - 32 mmol/L   Calcium 9.4 8.6 - 10.3  mg/dL   Total Protein 7.3 6.1 - 8.1 g/dL   Albumin 4.5 3.6 - 5.1 g/dL  Globulin 2.8 1.9 - 3.7 g/dL (calc)   AG Ratio 1.6 1.0 - 2.5 (calc)   Total Bilirubin 0.4 0.2 - 1.2 mg/dL   Alkaline phosphatase (APISO) 51 40 - 115 U/L   AST 17 10 - 35 U/L   ALT 14 9 - 46 U/L      Assessment & Plan:   Problem List Items Addressed This Visit      Cardiovascular and Mediastinum   Essential hypertension (Chronic)    Well-controlled        Endocrine   Controlled type 2 diabetes mellitus (HCC) (Chronic)    Continue meds; try to eat well; check A1c today; foot exam by MD; request eye exam        Other   Tobacco use    He is so close to quitting; no beer in one month and cigarettes are next he says; encouragement given; see AVS      Medication monitoring encounter    Check liver and kidneys periodically      Hyperlipidemia (Chronic)    Limit saturated fats; last levels were great; no need to check today      Blind left eye    chronic       Other Visit Diagnoses    Carpal tunnel syndrome of right wrist    -  Primary   explained dx; wear brace as much as possible; if not better in four weeks, contact me for referral; continue NSAID       Follow up plan: Return in about 3 months (around 08/31/2018) for follow-up visit with Dr. Sherie Don; do not fast.  An after-visit summary was printed and given to the patient at check-out.  Please see the patient instructions which may contain other information and recommendations beyond what is mentioned above in the assessment and plan.  Meds ordered this encounter  Medications  . Elastic Bandages & Supports (WRIST BRACE/RIGHT MEDIUM) MISC    Sig: For carpal tunnel syndrome; cock-up wrist brace; wear as much as possible    Dispense:  1 each    Refill:  0    No orders of the defined types were placed in this encounter.

## 2018-05-30 NOTE — Assessment & Plan Note (Signed)
chronic

## 2018-05-30 NOTE — Assessment & Plan Note (Signed)
Continue meds; try to eat well; check A1c today; foot exam by MD; request eye exam

## 2018-05-30 NOTE — Assessment & Plan Note (Addendum)
He is so close to quitting; no beer in one month and cigarettes are next he says; encouragement given; see AVS

## 2018-05-30 NOTE — Assessment & Plan Note (Signed)
Well controlled 

## 2018-05-30 NOTE — Assessment & Plan Note (Signed)
Check liver and kidneys periodically 

## 2018-05-30 NOTE — Patient Instructions (Addendum)
Carpal Tunnel Syndrome Carpal tunnel syndrome is a condition that causes pain in your hand and arm. The carpal tunnel is a narrow area that is on the palm side of your wrist. Repeated wrist motion or certain diseases may cause swelling in the tunnel. This swelling can pinch the main nerve in the wrist (median nerve). Follow these instructions at home: If you have a splint:  Wear it as told by your doctor. Remove it only as told by your doctor.  Loosen the splint if your fingers: ? Become numb and tingle. ? Turn blue and cold.  Keep the splint clean and dry. General instructions  Take over-the-counter and prescription medicines only as told by your doctor.  Rest your wrist from any activity that may be causing your pain. If needed, talk to your employer about changes that can be made in your work, such as getting a wrist pad to use while typing.  If directed, apply ice to the painful area: ? Put ice in a plastic bag. ? Place a towel between your skin and the bag. ? Leave the ice on for 20 minutes, 2-3 times per day.  Keep all follow-up visits as told by your doctor. This is important.  Do any exercises as told by your doctor, physical therapist, or occupational therapist. Contact a doctor if:  You have new symptoms.  Medicine does not help your pain.  Your symptoms get worse. This information is not intended to replace advice given to you by your health care provider. Make sure you discuss any questions you have with your health care provider. Document Released: 11/03/2011 Document Revised: 04/21/2016 Document Reviewed: 04/01/2015 Elsevier Interactive Patient Education  2018 [employer].  Steps to Quit Smoking Smoking tobacco can be bad for your health. It can also affect almost every organ in your body. Smoking puts you and people around you at risk for many serious long-lasting (chronic) diseases. Quitting smoking is hard, but it is one of the best things that you can  do for your health. It is never too late to quit. What are the benefits of quitting smoking? When you quit smoking, you lower your risk for getting serious diseases and conditions. They can include:  Lung cancer or lung disease.  Heart disease.  Stroke.  Heart attack.  Not being able to have children (infertility).  Weak bones (osteoporosis) and broken bones (fractures).  If you have coughing, wheezing, and shortness of breath, those symptoms may get better when you quit. You may also get sick less often. If you are pregnant, quitting smoking can help to lower your chances of having a baby of low birth weight. What can I do to help me quit smoking? Talk with your doctor about what can help you quit smoking. Some things you can do (strategies) include:  Quitting smoking totally, instead of slowly cutting back how much you smoke over a period of time.  Going to in-person counseling. You are more likely to quit if you go to many counseling sessions.  Using resources and support systems, such as: ? Agricultural engineer with a Veterinary surgeon. ? Phone quitlines. ? Automotive engineer. ? Support groups or group counseling. ? Text messaging programs. ? Mobile phone apps or applications.  Taking medicines. Some of these medicines may have nicotine in them. If you are pregnant or breastfeeding, do not take any medicines to quit smoking unless your doctor says it is okay. Talk with your doctor about counseling or other things that  can help you.  Talk with your doctor about using more than one strategy at the same time, such as taking medicines while you are also going to in-person counseling. This can help make quitting easier. What things can I do to make it easier to quit? Quitting smoking might feel very hard at first, but there is a lot that you can do to make it easier. Take these steps:  Talk to your family and friends. Ask them to support and encourage you.  Call phone quitlines, reach  out to support groups, or work with a Veterinary surgeoncounselor.  Ask people who smoke to not smoke around you.  Avoid places that make you want (trigger) to smoke, such as: ? Bars. ? Parties. ? Smoke-break areas at work.  Spend time with people who do not smoke.  Lower the stress in your life. Stress can make you want to smoke. Try these things to help your stress: ? Getting regular exercise. ? Deep-breathing exercises. ? Yoga. ? Meditating. ? Doing a body scan. To do this, close your eyes, focus on one area of your body at a time from head to toe, and notice which parts of your body are tense. Try to relax the muscles in those areas.  Download or buy apps on your mobile phone or tablet that can help you stick to your quit plan. There are many free apps, such as QuitGuide from the [employer] developer(Centers for Disease Control and Prevention). You can find more support from smokefree.gov and other websites.  This information is not intended to replace advice given to you by your health care provider. Make sure you discuss any questions you have with your health care provider. Document Released: 09/10/2009 Document Revised: 07/12/2016 Document Reviewed: 03/31/2015 Elsevier Interactive Patient Education  2018 [employer]

## 2018-06-08 ENCOUNTER — Encounter: Payer: Self-pay | Admitting: Family Medicine

## 2018-06-26 ENCOUNTER — Encounter: Payer: Self-pay | Admitting: Family Medicine

## 2018-06-26 ENCOUNTER — Ambulatory Visit: Payer: BLUE CROSS/BLUE SHIELD | Admitting: Family Medicine

## 2018-06-26 VITALS — BP 104/62 | HR 98 | Temp 98.3°F | Resp 14 | Ht 66.0 in | Wt 145.2 lb

## 2018-06-26 DIAGNOSIS — M19041 Primary osteoarthritis, right hand: Secondary | ICD-10-CM

## 2018-06-26 DIAGNOSIS — G5601 Carpal tunnel syndrome, right upper limb: Secondary | ICD-10-CM | POA: Diagnosis not present

## 2018-06-26 MED ORDER — GABAPENTIN 100 MG PO CAPS: ORAL_CAPSULE | ORAL | 0 refills | Status: DC

## 2018-06-26 NOTE — Assessment & Plan Note (Signed)
Noted on xray; discussed xray results

## 2018-06-26 NOTE — Assessment & Plan Note (Signed)
Continue brace; trial of gabapentin; titrate up; pt to call me in 2-3 weeks with update

## 2018-06-26 NOTE — Patient Instructions (Addendum)
Please just call me in 2-3 weeks with an update Start the new medicine We'll see you in October for your diabetes

## 2018-06-26 NOTE — Progress Notes (Signed)
BP 104/62   Pulse 98   Temp 98.3 F (36.8 C) (Oral)   Resp 14   Ht 5\' 6"  (1.676 m)   Wt 145 lb 3.2 oz (65.9 kg)   SpO2 96%   BMI 23.44 kg/m    Subjective:    Patient ID: John Howard, male    DOB: January 19, 1956, 62 y.o.   MRN: [id number]  HPI: John Howard is a 62 y.o. male  Chief Complaint  Patient presents with  . Numbness    rt hand i month has seen ortho and dx with carpal tunnel    HPI Started off with hand pain in the right hand; sent to Dr. Hyacinth Meeker and he put in a shot in the right hand Trigger finger diagnosed  xrays of the right hand done February 28, 2018: FINDINGS: Remote fifth metacarpal shaft fracture. No acute or subacute fracture. No dislocation. Diffuse interphalangeal degenerative joint narrowing and spurring. Degenerative spurring and joint narrowing of the second through fourth metacarpal phalangeal joints.  IMPRESSION: 1. No acute finding. 2. Remote and healed fifth metacarpal shaft fracture. 3. Interphalangeal and MCP osteoarthritis.   Electronically Signed   By: Marnee Spring M.D.   On: 02/28/2018 15:04  He is right-handed  Left hand bothering him some too, but not as bad as the right hand  Type 2 DM; doing well Lab Results  Component Value Date   HGBA1C 6.7 (H) 02/28/2018   Last beer a few months ago; he is still smoking, not ready to quit; going to work on one thing at a time  Depression screen [facility] 2/9 06/26/2018 05/30/2018 02/28/2018 11/30/2017 08/30/2017  Decreased Interest 0 0 0 0 0  Down, Depressed, Hopeless 0 0 0 0 0  PHQ - 2 Score 0 0 0 0 0    Relevant past medical, surgical, family and social history reviewed Past Medical History:  Diagnosis Date  . Arthritis    fingers  . Diabetes mellitus without complication (HCC)   . Hyperlipidemia   . Hypertension   . Wears dentures    full upper and lower   Past Surgical History:  Procedure Laterality Date  . COLONOSCOPY    . COLONOSCOPY WITH PROPOFOL N/A 03/21/2016   Procedure: COLONOSCOPY WITH PROPOFOL;  Surgeon: Midge Minium, MD;  Location: [facility];  Service: Endoscopy;  Laterality: N/A;  Diabetic - insulin  . EYE SURGERY     Family History  Problem Relation Age of Onset  . Hypertension Mother   . Diabetes Mother    Social History   Tobacco Use  . Smoking status: Current Some Day Smoker    Packs/day: 0.25    Years: 35.00    Pack years: 8.75    Types: Cigarettes  . Smokeless tobacco: Never Used  . Tobacco comment: 3/cig per day   Substance Use Topics  . Alcohol use: Not Currently    Alcohol/week: 0.6 oz    Types: 1 Cans of beer per week    Comment: pt is trying to quit, has not had a drink in a month  . Drug use: No    Interim medical history since last visit reviewed. Allergies and medications reviewed  Review of Systems Per HPI unless specifically indicated above     Objective:    BP 104/62   Pulse 98   Temp 98.3 F (36.8 C) (Oral)   Resp 14   Ht 5\' 6"  (1.676 m)   Wt 145 lb 3.2 oz (65.9 kg)  SpO2 96%   BMI 23.44 kg/m   Wt Readings from Last 3 Encounters:  06/26/18 145 lb 3.2 oz (65.9 kg)  05/30/18 141 lb 12.8 oz (64.3 kg)  02/28/18 144 lb 8 oz (65.5 kg)    Physical Exam  Musculoskeletal:       Right hand: He exhibits deformity. He exhibits no tenderness and no bony tenderness.       Left hand: He exhibits no tenderness, no bony tenderness and no deformity.       Hands: Neurological:  Grip 5-/5 right    Diabetic Foot Form - Detailed   Diabetic Foot Exam - detailed Diabetic Foot exam was performed with the following findings:  Yes 06/26/2018  3:13 PM  Visual Foot Exam completed.:  Yes  Pulse Foot Exam completed.:  Yes  Right Dorsalis Pedis:  Present Left Dorsalis Pedis:  Present  Sensory Foot Exam Completed.:  Yes Semmes-Weinstein Monofilament Test R Site 1-Great Toe:  Pos L Site 1-Great Toe:  Pos         Results for orders placed or performed in visit on 02/28/18  Lipid panel  Result Value Ref  Range   Cholesterol 129 <200 mg/dL   HDL 51 >09 mg/dL   Triglycerides 52 <811 mg/dL   LDL Cholesterol (Calc) 65 mg/dL (calc)   Total CHOL/HDL Ratio 2.5 <5.0 (calc)   Non-HDL Cholesterol (Calc) 78 <914 mg/dL (calc)  Hemoglobin N8G  Result Value Ref Range   Hgb A1c MFr Bld 6.7 (H) <5.7 % of total Hgb   Mean Plasma Glucose 146 (calc)   eAG (mmol/L) 8.1 (calc)  COMPLETE METABOLIC PANEL WITH GFR  Result Value Ref Range   Glucose, Bld 106 (H) 65 - 99 mg/dL   BUN 12 7 - 25 mg/dL   Creat 9.56 2.13 - 0.86 mg/dL   GFR, Est Non African American 93 > OR = 60 mL/min/1.46m2   GFR, Est African American 107 > OR = 60 mL/min/1.25m2   BUN/Creatinine Ratio NOT APPLICABLE 6 - 22 (calc)   Sodium 138 135 - 146 mmol/L   Potassium 4.3 3.5 - 5.3 mmol/L   Chloride 105 98 - 110 mmol/L   CO2 28 20 - 32 mmol/L   Calcium 9.4 8.6 - 10.3 mg/dL   Total Protein 7.3 6.1 - 8.1 g/dL   Albumin 4.5 3.6 - 5.1 g/dL   Globulin 2.8 1.9 - 3.7 g/dL (calc)   AG Ratio 1.6 1.0 - 2.5 (calc)   Total Bilirubin 0.4 0.2 - 1.2 mg/dL   Alkaline phosphatase (APISO) 51 40 - 115 U/L   AST 17 10 - 35 U/L   ALT 14 9 - 46 U/L      Assessment & Plan:   Problem List Items Addressed This Visit      Nervous and Auditory   Carpal tunnel syndrome of right wrist    Continue brace; trial of gabapentin; titrate up; pt to call me in 2-3 weeks with update      Relevant Medications   gabapentin (NEURONTIN) 100 MG capsule     Musculoskeletal and Integument   Osteoarthritis of right hand - Primary    Noted on xray; discussed xray results          Follow up plan: No follow-ups on file.  An after-visit summary was printed and given to the patient at check-out.  Please see the patient instructions which may contain other information and recommendations beyond what is mentioned above in the assessment and plan.  Meds ordered this encounter  Medications  . gabapentin (NEURONTIN) 100 MG capsule    Sig: One by mouth nightly x 3  nights, then two at night x 3 nights, then three at night    Dispense:  90 capsule    Refill:  0    No orders of the defined types were placed in this encounter.

## 2018-07-06 ENCOUNTER — Encounter: Payer: Self-pay | Admitting: Family Medicine

## 2018-07-06 ENCOUNTER — Ambulatory Visit: Payer: Self-pay | Admitting: *Deleted

## 2018-07-06 ENCOUNTER — Ambulatory Visit: Payer: BLUE CROSS/BLUE SHIELD | Admitting: Family Medicine

## 2018-07-06 VITALS — BP 122/80 | HR 100 | Temp 98.6°F | Resp 16 | Ht 66.0 in | Wt 143.6 lb

## 2018-07-06 DIAGNOSIS — M25611 Stiffness of right shoulder, not elsewhere classified: Secondary | ICD-10-CM

## 2018-07-06 DIAGNOSIS — M25552 Pain in left hip: Secondary | ICD-10-CM

## 2018-07-06 DIAGNOSIS — M25551 Pain in right hip: Secondary | ICD-10-CM | POA: Diagnosis not present

## 2018-07-06 DIAGNOSIS — M25612 Stiffness of left shoulder, not elsewhere classified: Secondary | ICD-10-CM | POA: Diagnosis not present

## 2018-07-06 NOTE — Telephone Encounter (Signed)
Pt's wife called regarding whether his medication is causing him to have muscular type pain. Wife is not on the DPR so asked to speak with her husband and he has gone to work.  Called pt to talk to him regarding his symptoms. He thinks this muscular type pain is coming from starting on the gabapentin. He is having pain in his knees, back and shoulders. Pain interferes with his normal activities. Also states his right hand is numb. Pt's medications also include him taking a statin and he has been taking that since April.  He denies weakness, headache,  any tick bites or blood in urine. Appointment scheduled per protocol.  Advised to call back for worsening symptoms, pt voiced understanding. Reason for Disposition . [1] MILD or MODERATE muscle aches or pain AND [2] taking a statin medicine (a lipid or cholesterol lowering drug)  Answer Assessment - Initial Assessment Questions 1. ONSET: "When did the muscle aches or body pains start?"      Started this week 07/02/18 2. LOCATION: "What part of your body is hurting?" (e.g., entire body, arms, legs)      Legs and hips, shoulders sometimes 3. SEVERITY: "How bad is the pain?" (Scale 1-10; or mild, moderate, severe)   - MILD (1-3): doesn't interfere with normal activities    - MODERATE (4-7): interferes with normal activities or awakens from sleep    - SEVERE (8-10):  excruciating pain, unable to do any normal activities      moderate 4. CAUSE: "What do you think is causing the pains?"     medications 5. FEVER: "Have you been having fever?"     no 6. OTHER SYMPTOMS: "Do you have any other symptoms?" (e.g., chest pain, weakness, rash, cold or flu symptoms, weight loss)     no 7. PREGNANCY: "Is there any chance you are pregnant?" "When was your last menstrual period?"     no 8. TRAVEL: "Have you traveled out of the country in the last month?" (e.g., travel history, exposures)     no  Protocols used: MUSCLE ACHES AND BODY PAIN-A-AH

## 2018-07-06 NOTE — Progress Notes (Signed)
Name: John Howard   MRN: [id number]    DOB: Feb 27, 1956   Date:07/06/2018       Progress Note  Subjective  Chief Complaint  Chief Complaint  Patient presents with  . Muscle Pain    stiffness for 5 days    HPI  Pt presents with concern for new onset bilateral hip/lower extremity stiffness and pain that began about 5 days ago.  He notes pain starts in his hips and sometimes radiates into the bilateral upper legs.  He also reports that this morning when he woke up his bilateral shoulders were stiff, but not painful.  He started gabapentin on 06/26/2018 after seeing PCP Dr. Sanda Klein for carpal tunnel syndrome- wearing brace on the RIGHT wrist today, but notes numbness and tingling occurs to bilateral wrists.  He denies vision changes, chest pain, shortness of breath, no swelling.  Patient Active Problem List   Diagnosis Date Noted  . Carpal tunnel syndrome of right wrist 06/26/2018  . Medication monitoring encounter 02/28/2018  . Tobacco use 02/28/2018  . Osteoarthritis of right hand 02/28/2018  . Chronic right shoulder pain 11/23/2016  . Vitamin D insufficiency 05/27/2016  . Special screening for malignant neoplasms, colon   . Second degree hemorrhoids   . Annual physical exam 02/22/2016  . Controlled type 2 diabetes mellitus ([place]) 06/23/2015  . Hyperlipidemia 06/23/2015  . Essential hypertension 06/23/2015  . Blind left eye 06/23/2015    Social History   Tobacco Use  . Smoking status: Current Some Day Smoker    Packs/day: 0.25    Years: 35.00    Pack years: 8.75    Types: Cigarettes  . Smokeless tobacco: Never Used  . Tobacco comment: 3/cig per day   Substance Use Topics  . Alcohol use: Not Currently    Alcohol/week: 1.0 standard drinks    Types: 1 Cans of beer per week    Comment: pt is trying to quit, has not had a drink in a month     Current Outpatient Medications:  .  aspirin 81 MG tablet, Take 81 mg by mouth daily., Disp: , Rfl:  .  Cholecalciferol (VITAMIN D  PO), Take 25 mg by mouth., Disp: , Rfl:  .  Elastic Bandages & Supports (WRIST BRACE/RIGHT MEDIUM) MISC, For carpal tunnel syndrome; cock-up wrist brace; wear as much as possible, Disp: 1 each, Rfl: 0 .  gabapentin (NEURONTIN) 100 MG capsule, One by mouth nightly x 3 nights, then two at night x 3 nights, then three at night, Disp: 90 capsule, Rfl: 0 .  glucose blood (BAYER CONTOUR TEST) test strip, Use as directed to check blood glucose twice daily, Disp: 180 each, Rfl: 3 .  Insulin Pen Needle (NOVOFINE) 32G X 6 MM MISC, use 1 twice a day, Disp: 100 each, Rfl: 3 .  LEVEMIR FLEXTOUCH 100 UNIT/ML Pen, Inject 20 Units into the skin daily at 10 pm., Disp: 15 mL, Rfl: 5 .  lisinopril (PRINIVIL,ZESTRIL) 20 MG tablet, Take 1 tablet (20 mg total) by mouth daily., Disp: 90 tablet, Rfl: 0 .  lovastatin (MEVACOR) 20 MG tablet, Take 1 tablet (20 mg total) by mouth at bedtime., Disp: 90 tablet, Rfl: 1 .  meloxicam (MOBIC) 15 MG tablet, , Disp: , Rfl: 0 .  metFORMIN (GLUCOPHAGE) 500 MG tablet, Take 1 tablet (500 mg total) by mouth 2 (two) times daily with a meal., Disp: 180 tablet, Rfl: 1 .  MICROLET LANCETS MISC, TEST BLOOD SUGAR three times a day, Disp: 100  each, Rfl: 11 .  Zoster Vaccine Adjuvanted (SHINGRIX) injection, Shingrix (PF) 50 mcg/0.5 mL intramuscular suspension, kit, Disp: , Rfl:   No Known Allergies  ROS  Ten systems reviewed and is negative except as mentioned in HPI.  Objective  Vitals:   07/06/18 1300  BP: 122/80  Pulse: 100  Resp: 16  Temp: 98.6 F (37 C)  TempSrc: Oral  SpO2: 98%  Weight: 143 lb 9.6 oz (65.1 kg)  Height: 5' 6"  (1.676 m)   Body mass index is 23.18 kg/m.  Nursing Note and Vital Signs reviewed.  Physical Exam  Constitutional: Patient appears well-developed and well-nourished. No distress.  HENT: Head: Normocephalic and atraumatic.  Eyes: Conjunctivae and EOM are normal. Pupils are equal, round, and reactive to light. No scleral icterus.  Neck: Normal  range of motion. Neck supple. No JVD present. No thyromegaly present.  Cardiovascular: Normal rate, regular rhythm and normal heart sounds.  No murmur heard. No BLE edema. Pulmonary/Chest: Effort normal and breath sounds normal. No respiratory distress. Abdominal: Soft. Bowel sounds are normal, no distension. There is no tenderness. no masses Musculoskeletal: RIGHT straight leg raise induces pain in the right hip and is +3/5 against resistance on flexion but +5/5 on extension; LEFT straight leg raise normal +5 against resistance.  No bony tenderness.  Deformity of middle finger on RIGHT hand - has nodular growth on DIP joint and appears slightly more swollen than usual. Neurological: he is alert and oriented to person, place, and time. No cranial nerve deficit. Coordination, balance, strength, speech and gait are normal.  Skin: Skin is warm and dry. No rash noted. No erythema.  Psychiatric: Patient has a normal mood and affect. behavior is normal. Judgment and thought content normal.  No results found for this or any previous visit (from the past 72 hour(s)).  Assessment & Plan  1. Pain of both hip joints - Will screen for possible PMR/Rheumatologic condition.  Discussed this with patient and he verbalizes understanding.  Advised may take OTC Tylenol or Naproxen PRN for pain.  Continue gabapentin 332m QHS. - Sed Rate (ESR) - ANA - C-reactive protein  2. Shoulder joint stiffness, bilateral - Will screen for possible PMR/Rheumatologic condition.  Discussed this with patient and he verbalizes understanding.  Advised may take OTC Tylenol or Naproxen PRN for pain.  Continue gabapentin 3077mQHS. - Sed Rate (ESR) - ANA - C-reactive protein  -Red flags and when to present for emergency care or RTC including fever >101.42F, chest pain, shortness of breath, new/worsening/un-resolving symptoms, severe shoulder or pelvic girdle pain reviewed with patient at time of visit. Follow up and care  instructions discussed and provided in AVS.

## 2018-07-07 LAB — SEDIMENTATION RATE: Sed Rate: 60 mm/h — ABNORMAL HIGH (ref 0–20)

## 2018-07-07 LAB — C-REACTIVE PROTEIN: CRP: 94.1 mg/L — ABNORMAL HIGH (ref <8.0)

## 2018-07-09 ENCOUNTER — Telehealth: Payer: Self-pay | Admitting: Family Medicine

## 2018-07-09 DIAGNOSIS — M25612 Stiffness of left shoulder, not elsewhere classified: Secondary | ICD-10-CM

## 2018-07-09 DIAGNOSIS — M25551 Pain in right hip: Secondary | ICD-10-CM

## 2018-07-09 DIAGNOSIS — R700 Elevated erythrocyte sedimentation rate: Secondary | ICD-10-CM

## 2018-07-09 DIAGNOSIS — R7982 Elevated C-reactive protein (CRP): Secondary | ICD-10-CM

## 2018-07-09 DIAGNOSIS — M25611 Stiffness of right shoulder, not elsewhere classified: Secondary | ICD-10-CM

## 2018-07-09 DIAGNOSIS — M25552 Pain in left hip: Principal | ICD-10-CM

## 2018-07-09 MED ORDER — PREDNISONE 10 MG PO TABS: 30.0000 mg | ORAL_TABLET | Freq: Every day | ORAL | 0 refills | Status: DC

## 2018-07-09 NOTE — Telephone Encounter (Signed)
High CRP and ESR I told Raquel Sarna I would take care of this today (she is off)

## 2018-07-09 NOTE — Telephone Encounter (Signed)
Pt notified, appt scheduled

## 2018-07-09 NOTE — Telephone Encounter (Signed)
Please let the patient know that his labs show very high levels of inflammation I've put in a referral to rheumatology STOP meloxicam and start prednisone Return to see me for evaluation of his sugars in 10 days

## 2018-07-13 NOTE — Telephone Encounter (Signed)
Spoke to patients wife Mavaren regarding medication changes and referral

## 2018-07-20 ENCOUNTER — Ambulatory Visit: Payer: BLUE CROSS/BLUE SHIELD | Admitting: Family Medicine

## 2018-07-20 ENCOUNTER — Encounter: Payer: Self-pay | Admitting: Family Medicine

## 2018-07-20 VITALS — BP 118/70 | HR 81 | Temp 98.3°F | Ht 66.0 in | Wt 144.1 lb

## 2018-07-20 DIAGNOSIS — R7982 Elevated C-reactive protein (CRP): Secondary | ICD-10-CM

## 2018-07-20 DIAGNOSIS — E782 Mixed hyperlipidemia: Secondary | ICD-10-CM | POA: Diagnosis not present

## 2018-07-20 DIAGNOSIS — Z720 Tobacco use: Secondary | ICD-10-CM | POA: Diagnosis not present

## 2018-07-20 DIAGNOSIS — E119 Type 2 diabetes mellitus without complications: Secondary | ICD-10-CM

## 2018-07-20 DIAGNOSIS — Z23 Encounter for immunization: Secondary | ICD-10-CM

## 2018-07-20 DIAGNOSIS — Z794 Long term (current) use of insulin: Secondary | ICD-10-CM

## 2018-07-20 MED ORDER — RANITIDINE HCL 300 MG PO TABS: 300.0000 mg | ORAL_TABLET | Freq: Every day | ORAL | 2 refills | Status: DC

## 2018-07-20 MED ORDER — PREDNISONE 10 MG PO TABS: 30.0000 mg | ORAL_TABLET | Freq: Every day | ORAL | 0 refills | Status: DC

## 2018-07-20 MED ORDER — LEVEMIR FLEXTOUCH 100 UNIT/ML ~~LOC~~ SOPN: 17.0000 [IU] | PEN_INJECTOR | Freq: Every day | SUBCUTANEOUS | 5 refills | Status: DC

## 2018-07-20 NOTE — Assessment & Plan Note (Signed)
Limit fried foods; check lipids in October

## 2018-07-20 NOTE — Progress Notes (Signed)
BP 118/70   Pulse 81   Temp 98.3 F (36.8 C)   Ht 5' 6"  (1.676 m)   Wt 144 lb 1.6 oz (65.4 kg)   SpO2 97%   BMI 23.26 kg/m    Subjective:    Patient ID: John Howard, male    DOB: May 25, 1956, 62 y.o.   MRN: [id number]  HPI: SANUEL Howard is a 62 y.o. male  Chief Complaint  Patient presents with  . Follow-up    HPI Here for f/u Type 2 diabetes; mother had it but it went away when she lost weight; grandmother and uncle also had DM FSBS 146 this morning, peanut butter crackers last night Checking FSBS every morning; lowest sugar in the last 2 weeks was 53 Lab Results  Component Value Date   HGBA1C 6.7 (H) 02/28/2018    High sed rate; on prednisone now; he feels a lot better; not stiff when getting out of the bed; can move better; goes to see rheumatologist next week, August 28th; no stomach upset on the prednisone; no blood in stool; he finished the last prednisone yesterday, needs refills if to continue  High cholesterol; last LDL was great at 65; not many fried foods; wife bakes a lot; taking statin  Depression screen [facility] 2/9 07/20/2018 07/06/2018 06/26/2018 05/30/2018 02/28/2018  Decreased Interest 0 0 0 0 0  Down, Depressed, Hopeless 0 0 0 0 0  PHQ - 2 Score 0 0 0 0 0  Altered sleeping - 0 - - -  Tired, decreased energy - 0 - - -  Change in appetite - 0 - - -  Feeling bad or failure about yourself  - 0 - - -  Trouble concentrating - 0 - - -  Moving slowly or fidgety/restless - 0 - - -  Suicidal thoughts - 0 - - -  PHQ-9 Score - 0 - - -  Difficult doing work/chores - Not difficult at all - - -    Relevant past medical, surgical, family and social history reviewed Past Medical History:  Diagnosis Date  . Arthritis    fingers  . Diabetes mellitus without complication ([place])   . Hyperlipidemia   . Hypertension   . Wears dentures    full upper and lower   Past Surgical History:  Procedure Laterality Date  . COLONOSCOPY    . COLONOSCOPY WITH PROPOFOL N/A  03/21/2016   Procedure: COLONOSCOPY WITH PROPOFOL;  Surgeon: Lucilla Lame, MD;  Location: [place];  Service: Endoscopy;  Laterality: N/A;  Diabetic - insulin  . EYE SURGERY     Family History  Problem Relation Age of Onset  . Hypertension Mother   . Diabetes Mother    Social History   Tobacco Use  . Smoking status: Current Some Day Smoker    Packs/day: 0.25    Years: 35.00    Pack years: 8.75    Types: Cigarettes  . Smokeless tobacco: Never Used  . Tobacco comment: 3/cig per day   Substance Use Topics  . Alcohol use: Not Currently    Alcohol/week: 1.0 standard drinks    Types: 1 Cans of beer per week    Comment: pt is trying to quit, has not had a drink in a month  . Drug use: No  MD note: he used to reach for cigarettes when drinking, but not drinking now  Interim medical history since last visit reviewed. Allergies and medications reviewed  Review of Systems Per HPI unless specifically  indicated above     Objective:    BP 118/70   Pulse 81   Temp 98.3 F (36.8 C)   Ht 5' 6"  (1.676 m)   Wt 144 lb 1.6 oz (65.4 kg)   SpO2 97%   BMI 23.26 kg/m   Wt Readings from Last 3 Encounters:  07/20/18 144 lb 1.6 oz (65.4 kg)  07/06/18 143 lb 9.6 oz (65.1 kg)  06/26/18 145 lb 3.2 oz (65.9 kg)    Physical Exam  Constitutional: He appears well-developed and well-nourished. No distress.  HENT:  Head: Normocephalic and atraumatic.  Eyes: EOM are normal. No scleral icterus.  Neck: No thyromegaly present.  Cardiovascular: Normal rate and regular rhythm.  Pulmonary/Chest: Effort normal and breath sounds normal.  Abdominal: Soft. Bowel sounds are normal. He exhibits no distension.  Musculoskeletal: He exhibits no edema.  Brace on RIGHT wrist  Neurological: Coordination normal.  Skin: Skin is warm and dry. No pallor.  Psychiatric: He has a normal mood and affect. His mood appears not anxious. He does not exhibit a depressed mood.   Diabetic Foot Form - Detailed     Diabetic Foot Exam - detailed Diabetic Foot exam was performed with the following findings:  Yes 07/20/2018  1:33 PM  Visual Foot Exam completed.:  Yes  Pulse Foot Exam completed.:  Yes  Right Dorsalis Pedis:  Present Left Dorsalis Pedis:  Present  Sensory Foot Exam Completed.:  Yes Semmes-Weinstein Monofilament Test R Site 1-Great Toe:  Pos L Site 1-Great Toe:  Pos         Results for orders placed or performed in visit on 07/06/18  Sed Rate (ESR)  Result Value Ref Range   Sed Rate 60 (H) 0 - 20 mm/h  C-reactive protein  Result Value Ref Range   CRP 94.1 (H) <8.0 mg/L      Assessment & Plan:   Problem List Items Addressed This Visit      Endocrine   Controlled type 2 diabetes mellitus (HCC) (Chronic)    Decrease the insulin by three units a day; continue other medicine; foot exam UTD      Relevant Medications   LEVEMIR FLEXTOUCH 100 UNIT/ML Pen     Other   Tobacco use    Encouragement given; he is so close to quitting; try to break those habits, get up and take a walk after meal      Hyperlipidemia (Chronic)    Limit fried foods; check lipids in October      Elevated C-reactive protein (CRP)    Likely PMR; continue prednisone 30 mg daily; see rheumatologist next week; do NOT stop prednisone abruptly; follow rheum instrctions       Other Visit Diagnoses    Type 2 diabetes mellitus without complication, without long-term current use of insulin ([place])    -  Primary   Relevant Medications   LEVEMIR FLEXTOUCH 100 UNIT/ML Pen   Need for influenza vaccination       Relevant Orders   Flu Vaccine QUAD 6+ mos PF IM (Fluarix Quad PF) (Completed)       Follow up plan: No follow-ups on file.  An after-visit summary was printed and given to the patient at [facility].  Please see the patient instructions which may contain other information and recommendations beyond what is mentioned above in the assessment and plan.  Meds ordered this encounter  Medications  .  LEVEMIR FLEXTOUCH 100 UNIT/ML Pen    Sig: Inject 17 Units into the  skin daily at 10 pm.    Dispense:  15 mL    Refill:  5  . predniSONE (DELTASONE) 10 MG tablet    Sig: Take 3 tablets (30 mg total) by mouth daily with breakfast. Do NOT stop abruptly; work with doctor to taper off    Dispense:  30 tablet    Refill:  0  . ranitidine (ZANTAC) 300 MG tablet    Sig: Take 1 tablet (300 mg total) by mouth at bedtime. To protect stomach from an ulcer    Dispense:  30 tablet    Refill:  2    Orders Placed This Encounter  Procedures  . Flu Vaccine QUAD 6+ mos PF IM (Fluarix Quad PF)

## 2018-07-20 NOTE — Assessment & Plan Note (Signed)
Decrease the insulin by three units a day; continue other medicine; foot exam UTD

## 2018-07-20 NOTE — Assessment & Plan Note (Signed)
Likely PMR; continue prednisone 30 mg daily; see rheumatologist next week; do NOT stop prednisone abruptly; follow rheum instrctions

## 2018-07-20 NOTE — Assessment & Plan Note (Signed)
Encouragement given; he is so close to quitting; try to break those habits, get up and take a walk after meal

## 2018-07-20 NOTE — Patient Instructions (Addendum)
Do not stop the prednisone all at once Do whatever Dr. Renard MatterBock tells you to do with your prednisone (she may leave it alone or increase it or decrease), but follow her instructions  Decrease the insulin from twenty units to seventeen units Continue the metformin as before  Try tolnaftate on the feet for the athlete's foot  Return for labs and a visit on or just after October 4th

## 2018-07-25 DIAGNOSIS — Z7952 Long term (current) use of systemic steroids: Secondary | ICD-10-CM | POA: Insufficient documentation

## 2018-07-25 DIAGNOSIS — M353 Polymyalgia rheumatica: Secondary | ICD-10-CM | POA: Insufficient documentation

## 2018-07-31 ENCOUNTER — Other Ambulatory Visit: Payer: Self-pay | Admitting: Family Medicine

## 2018-07-31 DIAGNOSIS — I10 Essential (primary) hypertension: Secondary | ICD-10-CM

## 2018-08-01 ENCOUNTER — Telehealth: Payer: Self-pay | Admitting: Family Medicine

## 2018-08-01 NOTE — Telephone Encounter (Signed)
Please find out why ANA was never resulted - this was ordered on 07/06/18. Thanks!

## 2018-08-02 NOTE — Telephone Encounter (Signed)
Cindy at [facility] will check on results for ANA

## 2018-08-22 ENCOUNTER — Other Ambulatory Visit: Payer: Self-pay | Admitting: Family Medicine

## 2018-08-22 DIAGNOSIS — E7800 Pure hypercholesterolemia, unspecified: Secondary | ICD-10-CM

## 2018-08-22 DIAGNOSIS — E119 Type 2 diabetes mellitus without complications: Secondary | ICD-10-CM

## 2018-08-23 NOTE — Telephone Encounter (Signed)
Labs from April reviewed; Rxs approved

## 2018-08-31 ENCOUNTER — Ambulatory Visit: Payer: BLUE CROSS/BLUE SHIELD | Admitting: Family Medicine

## 2018-08-31 ENCOUNTER — Encounter: Payer: Self-pay | Admitting: Family Medicine

## 2018-08-31 VITALS — BP 122/60 | HR 76 | Temp 98.2°F | Ht 66.0 in | Wt 141.2 lb

## 2018-08-31 DIAGNOSIS — Z794 Long term (current) use of insulin: Secondary | ICD-10-CM

## 2018-08-31 DIAGNOSIS — E119 Type 2 diabetes mellitus without complications: Secondary | ICD-10-CM | POA: Diagnosis not present

## 2018-08-31 DIAGNOSIS — Z720 Tobacco use: Secondary | ICD-10-CM

## 2018-08-31 DIAGNOSIS — Z5181 Encounter for therapeutic drug level monitoring: Secondary | ICD-10-CM | POA: Diagnosis not present

## 2018-08-31 DIAGNOSIS — I10 Essential (primary) hypertension: Secondary | ICD-10-CM | POA: Diagnosis not present

## 2018-08-31 DIAGNOSIS — G5601 Carpal tunnel syndrome, right upper limb: Secondary | ICD-10-CM

## 2018-08-31 DIAGNOSIS — E782 Mixed hyperlipidemia: Secondary | ICD-10-CM

## 2018-08-31 MED ORDER — GABAPENTIN 400 MG PO CAPS: 400.0000 mg | ORAL_CAPSULE | Freq: Every day | ORAL | 5 refills | Status: DC

## 2018-08-31 NOTE — Progress Notes (Signed)
BP 122/60   Pulse 76   Temp 98.2 F (36.8 C)   Ht 5\' 6"  (1.676 m)   Wt 141 lb 3.2 oz (64 kg)   SpO2 99%   BMI 22.79 kg/m    Subjective:    Patient ID: John Howard, male    DOB: 1956/02/10, 62 y.o.   MRN: [id number]  HPI: John Howard is a 62 y.o. male  Chief Complaint  Patient presents with  . Follow-up    HPI Here for f/u; no medical excitement since last visit  Type 2 diabetes; no new problems with feet; checking sugars every day; 192 this morning; 197 last week; best sugar was 80; went back to 21 units of insulin; no sickness; no increased stress Last A1c 6.7  High cholesterol; taking medicine (statin); not much fatty foods; less fried foods; rarely eats cheese; eats salads  High blood pressure; controlled with lisinopril; not much salt added to food; maybe some green beans  Taking ranitidine for stomach; no blood in the stool  Carpal tunnel on the right; wearing a brace; using pills but not controlled  Tobacco abuse; just smoking 3 cigarettes a day; does not buy any packs; not ready to quit completely  Depression screen [facility] 2/9 08/31/2018 07/20/2018 07/06/2018 06/26/2018 05/30/2018  Decreased Interest 0 0 0 0 0  Down, Depressed, Hopeless 0 0 0 0 0  PHQ - 2 Score 0 0 0 0 0  Altered sleeping 0 - 0 - -  Tired, decreased energy 0 - 0 - -  Change in appetite 0 - 0 - -  Feeling bad or failure about yourself  0 - 0 - -  Trouble concentrating 0 - 0 - -  Moving slowly or fidgety/restless 0 - 0 - -  Suicidal thoughts 0 - 0 - -  PHQ-9 Score 0 - 0 - -  Difficult doing work/chores - - Not difficult at all - -   Fall Risk  08/31/2018 07/20/2018 07/06/2018 06/26/2018 05/30/2018  Falls in the past year? No No No No No    Relevant past medical, surgical, family and social history reviewed Past Medical History:  Diagnosis Date  . Arthritis    fingers  . Diabetes mellitus without complication (HCC)   . Hyperlipidemia   . Hypertension   . Wears dentures    full upper and  lower   Past Surgical History:  Procedure Laterality Date  . COLONOSCOPY    . COLONOSCOPY WITH PROPOFOL N/A 03/21/2016   Procedure: COLONOSCOPY WITH PROPOFOL;  Surgeon: Midge Minium, MD;  Location: [facility];  Service: Endoscopy;  Laterality: N/A;  Diabetic - insulin  . EYE SURGERY     Family History  Problem Relation Age of Onset  . Hypertension Mother   . Diabetes Mother    Social History   Tobacco Use  . Smoking status: Current Some Day Smoker    Packs/day: 0.25    Years: 35.00    Pack years: 8.75    Types: Cigarettes  . Smokeless tobacco: Never Used  . Tobacco comment: 3/cig per day   Substance Use Topics  . Alcohol use: Not Currently    Alcohol/week: 1.0 standard drinks    Types: 1 Cans of beer per week    Comment: pt is trying to quit, has not had a drink in a month  . Drug use: No     [facility]/31/2018 [facility]  AUDIT-C Score  0  Interim medical history since last visit reviewed. Allergies and medications reviewed  Review of Systems Per HPI unless specifically indicated above     Objective:    BP 122/60   Pulse 76   Temp 98.2 F (36.8 C)   Ht 5\' 6"  (1.676 m)   Wt 141 lb 3.2 oz (64 kg)   SpO2 99%   BMI 22.79 kg/m   Wt Readings from Last 3 Encounters:  08/31/18 141 lb 3.2 oz (64 kg)  07/20/18 144 lb 1.6 oz (65.4 kg)  07/06/18 143 lb 9.6 oz (65.1 kg)    Physical Exam  Constitutional: He appears well-developed and well-nourished. No distress.  HENT:  Head: Normocephalic and atraumatic.  Eyes: EOM are normal. No scleral icterus.  Neck: No thyromegaly present.  Cardiovascular: Normal rate and regular rhythm.  Pulmonary/Chest: Effort normal and breath sounds normal.  Abdominal: Soft. Bowel sounds are normal. He exhibits no distension.  Musculoskeletal: He exhibits no edema.  Neurological: Coordination normal.  Brace on the right wrist; grip 5/5; mild dysesthesia right thumb, index finger, and middle  finger  Skin: Skin is warm and dry. No pallor.  Psychiatric: He has a normal mood and affect. His behavior is normal. Judgment and thought content normal.   Diabetic Foot Form - Detailed   Diabetic Foot Exam - detailed Diabetic Foot exam was performed with the following findings:  Yes 08/31/2018 10:32 AM  Visual Foot Exam completed.:  Yes  Pulse Foot Exam completed.:  Yes  Right Dorsalis Pedis:  Present Left Dorsalis Pedis:  Present  Sensory Foot Exam Completed.:  Yes Semmes-Weinstein Monofilament Test R Site 1-Great Toe:  Pos L Site 1-Great Toe:  Pos           Assessment & Plan:   Problem List Items Addressed This Visit      Cardiovascular and Mediastinum   Essential hypertension - Primary (Chronic)    Well-controlled wth medicine; glad patient rarely uses extra salt        Endocrine   Controlled type 2 diabetes mellitus (HCC) (Chronic)    Check A1c today; foot exam already done by MD this morning      Relevant Orders   Microalbumin / creatinine urine ratio (Completed)   Lipid panel (Completed)   Hemoglobin A1c (Completed)   COMPLETE METABOLIC PANEL WITH GFR (Completed)     Nervous and Auditory   Carpal tunnel syndrome of right wrist    Increase gabapentin bcause sx not controlled; continue brace      Relevant Medications   gabapentin (NEURONTIN) 400 MG capsule   Other Relevant Orders   TSH (Completed)     Other   Medication monitoring encounter   Relevant Orders   COMPLETE METABOLIC PANEL WITH GFR (Completed)   Tobacco use   Hyperlipidemia (Chronic)   Relevant Orders   Lipid panel (Completed)       Follow up plan: Return in about 6 months (around 03/02/2019) for follow-up visit with Dr. Sherie Don.  An after-visit summary was printed and given to the patient at check-out.  Please see the patient instructions which may contain other information and recommendations beyond what is mentioned above in the assessment and plan.  Meds ordered this encounter    Medications  . gabapentin (NEURONTIN) 400 MG capsule    Sig: Take 1 capsule (400 mg total) by mouth at bedtime.    Dispense:  30 capsule    Refill:  5    Orders Placed This Encounter  Procedures  .  Microalbumin / creatinine urine ratio  . Lipid panel  . Hemoglobin A1c  . COMPLETE METABOLIC PANEL WITH GFR  . TSH

## 2018-08-31 NOTE — Patient Instructions (Signed)
Let's get labs today I do encourage you to quit smoking Call [phone] to sign up for smoking cessation classes You can call 1-800-QUIT-NOW to talk with a smoking cessation coach Try to follow the DASH guidelines (DASH stands for Dietary Approaches to Stop Hypertension). Try to limit the sodium in your diet to no more than 1,500mg  of sodium per day. Certainly try to not exceed 2,000 mg per day at the very most. Do not add salt when cooking or at the table.  Check the sodium amount on labels when shopping, and choose items lower in sodium when given a choice. Avoid or limit foods that already contain a lot of sodium. Eat a diet rich in fruits and vegetables and whole grains, and try to lose weight if overweight or obese

## 2018-08-31 NOTE — Assessment & Plan Note (Signed)
Increase gabapentin bcause sx not controlled; continue brace

## 2018-08-31 NOTE — Assessment & Plan Note (Signed)
Check A1c today; foot exam already done by MD this morning

## 2018-08-31 NOTE — Assessment & Plan Note (Signed)
Well-controlled wth medicine; glad patient rarely uses extra salt

## 2018-09-01 LAB — COMPLETE METABOLIC PANEL WITH GFR
AG Ratio: 1.8 (calc) (ref 1.0–2.5)
ALT: 10 U/L (ref 9–46)
AST: 11 U/L (ref 10–35)
Albumin: 4.3 g/dL (ref 3.6–5.1)
Alkaline phosphatase (APISO): 46 U/L (ref 40–115)
BUN: 15 mg/dL (ref 7–25)
CO2: 27 mmol/L (ref 20–32)
Calcium: 9.3 mg/dL (ref 8.6–10.3)
Chloride: 99 mmol/L (ref 98–110)
Creat: 0.93 mg/dL (ref 0.70–1.25)
GFR, Est African American: 102 mL/min/{1.73_m2} (ref >=60)
GFR, Est Non African American: 88 mL/min/{1.73_m2} (ref >=60)
Globulin: 2.4 g/dL (calc) (ref 1.9–3.7)
Glucose, Bld: 266 mg/dL — ABNORMAL HIGH (ref 65–139)
Potassium: 4.8 mmol/L (ref 3.5–5.3)
Sodium: 135 mmol/L (ref 135–146)
Total Bilirubin: 0.3 mg/dL (ref 0.2–1.2)
Total Protein: 6.7 g/dL (ref 6.1–8.1)

## 2018-09-01 LAB — LIPID PANEL
Cholesterol: 133 mg/dL (ref <200)
HDL: 66 mg/dL (ref >40)
LDL Cholesterol (Calc): 56 mg/dL (calc)
Non-HDL Cholesterol (Calc): 67 mg/dL (calc) (ref <130)
Total CHOL/HDL Ratio: 2 (calc) (ref <5.0)
Triglycerides: 40 mg/dL (ref <150)

## 2018-09-01 LAB — MICROALBUMIN / CREATININE URINE RATIO
Creatinine, Urine: 45 mg/dL (ref 20–320)
Microalb, Ur: <0.2 mg/dL

## 2018-09-01 LAB — HEMOGLOBIN A1C
Hgb A1c MFr Bld: 10.1 % of total Hgb — ABNORMAL HIGH (ref <5.7)
Mean Plasma Glucose: 243 (calc)
eAG (mmol/L): 13.5 (calc)

## 2018-09-01 LAB — TSH: TSH: 0.63 mIU/L (ref 0.40–4.50)

## 2018-09-05 ENCOUNTER — Other Ambulatory Visit: Payer: Self-pay | Admitting: Family Medicine

## 2018-09-05 DIAGNOSIS — E119 Type 2 diabetes mellitus without complications: Secondary | ICD-10-CM

## 2018-09-05 MED ORDER — ERTUGLIFLOZIN L-PYROGLUTAMICAC 5 MG PO TABS: 1.0000 | ORAL_TABLET | Freq: Every day | ORAL | 5 refills | Status: DC

## 2018-09-05 MED ORDER — METFORMIN HCL 500 MG PO TABS: 1000.0000 mg | ORAL_TABLET | Freq: Two times a day (BID) | ORAL | 0 refills | Status: DC

## 2018-09-05 NOTE — Progress Notes (Signed)
Increase metformin Add steglatro

## 2018-09-08 ENCOUNTER — Other Ambulatory Visit: Payer: Self-pay | Admitting: Family Medicine

## 2018-09-08 DIAGNOSIS — Z794 Long term (current) use of insulin: Secondary | ICD-10-CM

## 2018-09-08 DIAGNOSIS — E119 Type 2 diabetes mellitus without complications: Secondary | ICD-10-CM

## 2018-09-10 ENCOUNTER — Encounter: Payer: Self-pay | Admitting: Family Medicine

## 2018-09-10 DIAGNOSIS — Z794 Long term (current) use of insulin: Secondary | ICD-10-CM | POA: Insufficient documentation

## 2018-09-10 NOTE — Assessment & Plan Note (Signed)
On insulin for DM

## 2018-10-13 ENCOUNTER — Other Ambulatory Visit: Payer: Self-pay | Admitting: Family Medicine

## 2018-11-01 ENCOUNTER — Other Ambulatory Visit: Payer: Self-pay | Admitting: Nurse Practitioner

## 2018-11-01 DIAGNOSIS — I10 Essential (primary) hypertension: Secondary | ICD-10-CM

## 2018-11-02 NOTE — Telephone Encounter (Signed)
Lab Results  Component Value Date   CREATININE 0.93 08/31/2018   Lab Results  Component Value Date   K 4.8 08/31/2018

## 2018-11-13 ENCOUNTER — Other Ambulatory Visit: Payer: Self-pay | Admitting: Family Medicine

## 2018-11-14 MED ORDER — FAMOTIDINE 20 MG PO TABS: 20.0000 mg | ORAL_TABLET | Freq: Two times a day (BID) | ORAL | 2 refills | Status: DC | PRN

## 2018-11-14 NOTE — Telephone Encounter (Signed)
Please let pt know that there was a recall on ranitidine Switch to new med Call with any problems

## 2018-11-14 NOTE — Telephone Encounter (Signed)
Left detailed VM.  

## 2018-11-20 ENCOUNTER — Other Ambulatory Visit: Payer: Self-pay | Admitting: Family Medicine

## 2018-11-20 DIAGNOSIS — E7800 Pure hypercholesterolemia, unspecified: Secondary | ICD-10-CM

## 2018-11-20 NOTE — Telephone Encounter (Signed)
Lab Results  Component Value Date   CHOL 133 08/31/2018   HDL 66 08/31/2018   LDLCALC 56 08/31/2018   TRIG 40 08/31/2018   CHOLHDL 2.0 08/31/2018   Lab Results  Component Value Date   ALT 10 08/31/2018

## 2018-11-21 ENCOUNTER — Other Ambulatory Visit: Payer: Self-pay | Admitting: Family Medicine

## 2018-11-21 DIAGNOSIS — E119 Type 2 diabetes mellitus without complications: Secondary | ICD-10-CM

## 2018-11-21 NOTE — Telephone Encounter (Signed)
Lab Results  Component Value Date   CREATININE 0.93 08/31/2018

## 2018-12-05 ENCOUNTER — Telehealth: Payer: Self-pay

## 2018-12-05 ENCOUNTER — Ambulatory Visit: Payer: BLUE CROSS/BLUE SHIELD | Admitting: Family Medicine

## 2018-12-05 ENCOUNTER — Encounter: Payer: Self-pay | Admitting: Family Medicine

## 2018-12-05 ENCOUNTER — Ambulatory Visit
Admission: RE | Admit: 2018-12-05 | Discharge: 2018-12-05 | Disposition: A | Discharge status: Home / Self Care | Payer: BLUE CROSS/BLUE SHIELD | Source: Ambulatory Visit | Attending: Family Medicine | Admitting: Family Medicine

## 2018-12-05 ENCOUNTER — Ambulatory Visit
Admission: RE | Admit: 2018-12-05 | Discharge: 2018-12-05 | Disposition: A | Discharge status: Home / Self Care | Payer: BLUE CROSS/BLUE SHIELD | Attending: Family Medicine | Admitting: Family Medicine

## 2018-12-05 VITALS — BP 102/60 | HR 71 | Temp 98.0°F | Ht 66.0 in | Wt 134.1 lb

## 2018-12-05 DIAGNOSIS — Z720 Tobacco use: Secondary | ICD-10-CM

## 2018-12-05 DIAGNOSIS — E119 Type 2 diabetes mellitus without complications: Secondary | ICD-10-CM | POA: Diagnosis not present

## 2018-12-05 DIAGNOSIS — Z114 Encounter for screening for human immunodeficiency virus [HIV]: Secondary | ICD-10-CM

## 2018-12-05 DIAGNOSIS — Z5181 Encounter for therapeutic drug level monitoring: Secondary | ICD-10-CM

## 2018-12-05 DIAGNOSIS — R634 Abnormal weight loss: Secondary | ICD-10-CM

## 2018-12-05 DIAGNOSIS — S22000A Wedge compression fracture of unspecified thoracic vertebra, initial encounter for closed fracture: Secondary | ICD-10-CM

## 2018-12-05 DIAGNOSIS — Z794 Long term (current) use of insulin: Secondary | ICD-10-CM

## 2018-12-05 DIAGNOSIS — I10 Essential (primary) hypertension: Secondary | ICD-10-CM

## 2018-12-05 DIAGNOSIS — Z23 Encounter for immunization: Secondary | ICD-10-CM

## 2018-12-05 DIAGNOSIS — M353 Polymyalgia rheumatica: Secondary | ICD-10-CM

## 2018-12-05 DIAGNOSIS — E1165 Type 2 diabetes mellitus with hyperglycemia: Secondary | ICD-10-CM

## 2018-12-05 DIAGNOSIS — Z1159 Encounter for screening for other viral diseases: Secondary | ICD-10-CM

## 2018-12-05 DIAGNOSIS — E782 Mixed hyperlipidemia: Secondary | ICD-10-CM

## 2018-12-05 HISTORY — DX: Wedge compression fracture of unspecified thoracic vertebra, initial encounter for closed fracture: S22.000A

## 2018-12-05 MED ORDER — METFORMIN HCL 500 MG PO TABS: 500.0000 mg | ORAL_TABLET | Freq: Every day | ORAL | Status: DC

## 2018-12-05 MED ORDER — MICROLET LANCETS MISC: 11 refills | Status: DC

## 2018-12-05 MED ORDER — LEVEMIR FLEXTOUCH 100 UNIT/ML ~~LOC~~ SOPN: 10.0000 [IU] | PEN_INJECTOR | Freq: Every day | SUBCUTANEOUS | 5 refills | Status: DC

## 2018-12-05 MED ORDER — LISINOPRIL 10 MG PO TABS: 10.0000 mg | ORAL_TABLET | Freq: Every day | ORAL | 1 refills | Status: DC

## 2018-12-05 NOTE — Assessment & Plan Note (Signed)
Managed by rheum; on 6 mg of prednisone daily

## 2018-12-05 NOTE — Progress Notes (Signed)
BP 102/60   Pulse 71   Temp 98 F (36.7 C) (Oral)   Ht 5\' 6"  (1.676 m)   Wt 134 lb 1.6 oz (60.8 kg)   SpO2 97%   BMI 21.64 kg/m    Subjective:    Patient ID: John Howard, male    DOB: [birth year], 63 y.o.   MRN: [id number]  HPI: John Howard is a 64 y.o. male  Chief Complaint  Patient presents with  . Follow-up  . Medication Refill    HPI Patient is here for f/u  Type 2 diabetes; checking FSBS once time a day, mornings; in the last 2 weeks, range 50s to 105 He is taking 17 units a day of insulin; dropped back on metformin on his own because sugars were low; does not keep anything with him for low sugars, but he has peppermints at work  Lab Results  Component Value Date   HGBA1C 10.1 (H) 08/31/2018    High cholesterol; on lovastatin; no body aches; not much fried foods; more baked foods, salads, greens; eats honey nut cheerios and oatmeal; occasional egg  High blood pressure; lower BP today  Smoking; still 3-4 cigs a day; no beer since April; cigarettes will be next he says; just relaxes him; wife does not smoke  Weight loss noted; no blood in stool; no worrisome moles; smoker; less than 30 pack years; colonoscopy UTD; no night sweats; no cough  Depression screen [facility] 2/9 12/05/2018 08/31/2018 07/20/2018 07/06/2018 06/26/2018  Decreased Interest 0 0 0 0 0  Down, Depressed, Hopeless 0 0 0 0 0  PHQ - 2 Score 0 0 0 0 0  Altered sleeping 0 0 - 0 -  Tired, decreased energy 0 0 - 0 -  Change in appetite 0 0 - 0 -  Feeling bad or failure about yourself  0 0 - 0 -  Trouble concentrating 0 0 - 0 -  Moving slowly or fidgety/restless 0 0 - 0 -  Suicidal thoughts 0 0 - 0 -  PHQ-9 Score 0 0 - 0 -  Difficult doing work/chores Not difficult at all - - Not difficult at all -   Fall Risk  12/05/2018 08/31/2018 07/20/2018 07/06/2018 06/26/2018  Falls in the past year? 0 No No No No  Number falls in past yr: 0 - - - -  Injury with Fall? 0 - - - -    Relevant past medical,  surgical, family and social history reviewed Past Medical History:  Diagnosis Date  . Arthritis    fingers  . Diabetes mellitus without complication (HCC)   . Hyperlipidemia   . Hypertension   . Wears dentures    full upper and lower   Past Surgical History:  Procedure Laterality Date  . COLONOSCOPY    . COLONOSCOPY WITH PROPOFOL N/A 03/21/2016   Procedure: COLONOSCOPY WITH PROPOFOL;  Surgeon: Midge Minium, MD;  Location: [facility];  Service: Endoscopy;  Laterality: N/A;  Diabetic - insulin  . EYE SURGERY     Family History  Problem Relation Age of Onset  . Hypertension Mother   . Diabetes Mother    Social History   Tobacco Use  . Smoking status: Current Some Day Smoker    Packs/day: 0.25    Years: 35.00    Pack years: 8.75    Types: Cigarettes  . Smokeless tobacco: Never Used  . Tobacco comment: 3/cig per day   Substance Use Topics  . Alcohol use: Not Currently  Alcohol/week: 1.0 standard drinks    Types: 1 Cans of beer per week    Comment: pt is trying to quit, has not had a drink in a month  . Drug use: No  No drinks since April    Office Visit from 12/05/2018 in [facility]  AUDIT-C Score  0      Interim medical history since last visit reviewed. Allergies and medications reviewed  Review of Systems  Constitutional: Positive for unexpected weight change (weight loss).  HENT: Negative for trouble swallowing.   Respiratory: Negative for cough.   Gastrointestinal: Negative for blood in stool.  Endocrine: Negative for polydipsia.  Genitourinary: Negative for hematuria.  Skin:       No worrisome moles  Hematological: Negative for adenopathy.   Per HPI unless specifically indicated above     Objective:    BP 102/60   Pulse 71   Temp 98 F (36.7 C) (Oral)   Ht 5\' 6"  (1.676 m)   Wt 134 lb 1.6 oz (60.8 kg)   SpO2 97%   BMI 21.64 kg/m   Wt Readings from Last 3 Encounters:  12/05/18 134 lb 1.6 oz (60.8 kg)  08/31/18 141  lb 3.2 oz (64 kg)  07/20/18 144 lb 1.6 oz (65.4 kg)    Physical Exam Constitutional:      General: He is not in acute distress.    Appearance: He is well-developed.     Comments: Weight loss noted  HENT:     Head: Normocephalic and atraumatic.  Eyes:     General: No scleral icterus. Neck:     Thyroid: No thyromegaly.  Cardiovascular:     Rate and Rhythm: Normal rate and regular rhythm.  Pulmonary:     Effort: Pulmonary effort is normal.     Breath sounds: Normal breath sounds.  Abdominal:     General: Bowel sounds are normal. There is no distension.     Palpations: Abdomen is soft.  Skin:    General: Skin is warm and dry.     Coloration: Skin is not pale.  Neurological:     Mental Status: He is alert.     Coordination: Coordination normal.     Deep Tendon Reflexes:     Reflex Scores:      Patellar reflexes are 2+ on the right side and 2+ on the left side. Psychiatric:        Behavior: Behavior normal.        Thought Content: Thought content normal.        Judgment: Judgment normal.    Diabetic Foot Form - Detailed   Diabetic Foot Exam - detailed Diabetic Foot exam was performed with the following findings:  Yes 12/05/2018  9:19 AM  Visual Foot Exam completed.:  Yes  Pulse Foot Exam completed.:  Yes  Right Dorsalis Pedis:  Present Left Dorsalis Pedis:  Present  Sensory Foot Exam Completed.:  Yes Semmes-Weinstein Monofilament Test R Site 1-Great Toe:  Pos L Site 1-Great Toe:  Pos         Results for orders placed or performed in visit on 08/31/18  Microalbumin / creatinine urine ratio  Result Value Ref Range   Creatinine, Urine 45 20 - 320 mg/dL   Microalb, Ur <7.4 mg/dL   Microalb Creat Ratio NOTE <30 mcg/mg creat  Lipid panel  Result Value Ref Range   Cholesterol 133 <200 mg/dL   HDL 66 >12 mg/dL   Triglycerides 40 <878 mg/dL  LDL Cholesterol (Calc) 56 mg/dL (calc)   Total CHOL/HDL Ratio 2.0 <5.0 (calc)   Non-HDL Cholesterol (Calc) 67 <161<130 mg/dL (calc)    Hemoglobin [id number]  Result Value Ref Range   Hgb A1c MFr Bld 10.1 (H) <5.7 % of total Hgb   Mean Plasma Glucose 243 (calc)   eAG (mmol/L) 13.5 (calc)  COMPLETE METABOLIC PANEL WITH GFR  Result Value Ref Range   Glucose, Bld 266 (H) 65 - 139 mg/dL   BUN 15 7 - 25 mg/dL   Creat 0.450.93 4.090.70 - 8.111.25 mg/dL   GFR, Est Non African American 88 > OR = 60 mL/min/1.3673m2   GFR, Est African American 102 > OR = 60 mL/min/1.8773m2   BUN/Creatinine Ratio NOT APPLICABLE 6 - 22 (calc)   Sodium 135 135 - 146 mmol/L   Potassium 4.8 3.5 - 5.3 mmol/L   Chloride 99 98 - 110 mmol/L   CO2 27 20 - 32 mmol/L   Calcium 9.3 8.6 - 10.3 mg/dL   Total Protein 6.7 6.1 - 8.1 g/dL   Albumin 4.3 3.6 - 5.1 g/dL   Globulin 2.4 1.9 - 3.7 g/dL (calc)   AG Ratio 1.8 1.0 - 2.5 (calc)   Total Bilirubin 0.3 0.2 - 1.2 mg/dL   Alkaline phosphatase (APISO) 46 40 - 115 U/L   AST 11 10 - 35 U/L   ALT 10 9 - 46 U/L  TSH  Result Value Ref Range   TSH 0.63 0.40 - 4.50 mIU/L      Assessment & Plan:   Problem List Items Addressed This Visit      Cardiovascular and Mediastinum   Essential hypertension (Chronic)    Decrease medicine; return in 4 weeks      Relevant Medications   lisinopril (PRINIVIL,ZESTRIL) 10 MG tablet     Endocrine   DM (diabetes mellitus), type 2, uncontrolled (HCC)    Decrease the insulin to 10 units daily; decrease metformin to just one a day; foot exam UTD; check A1c today      Relevant Medications   MICROLET LANCETS MISC   LEVEMIR FLEXTOUCH 100 UNIT/ML Pen   lisinopril (PRINIVIL,ZESTRIL) 10 MG tablet   metFORMIN (GLUCOPHAGE) 500 MG tablet     Other   Medication monitoring encounter   Hyperlipidemia (Chronic)   Relevant Medications   lisinopril (PRINIVIL,ZESTRIL) 10 MG tablet   Tobacco use    Encouraged smoking cessation; order chest xray and CT since with the weight loss      Relevant Orders   DG Chest 2 View (Completed)   CT Chest Wo Contrast   Polymyalgia rheumatica syndrome (HCC)     Managed by rheum; on 6 mg of prednisone daily      Long-term insulin use (HCC)    Decrease dose; carry candy or other calories for low sugars; do not drive with low blood sugars       Other Visit Diagnoses    Type 2 diabetes mellitus without complication, without long-term current use of insulin (HCC)    -  Primary   Relevant Medications   MICROLET LANCETS MISC   LEVEMIR FLEXTOUCH 100 UNIT/ML Pen   lisinopril (PRINIVIL,ZESTRIL) 10 MG tablet   metFORMIN (GLUCOPHAGE) 500 MG tablet   Other Relevant Orders   Hemoglobin A1c   Weight loss       Relevant Orders   DG Chest 2 View (Completed)   CT Chest Wo Contrast   TSH   T4, free   CBC with Differential/Platelet  Screening for HIV (human immunodeficiency virus)       Relevant Orders   HIV antibody (with reflex)   Encounter for hepatitis C screening test for low risk patient       Relevant Orders   Hepatitis C Antibody   Need for Tdap vaccination       Relevant Orders   Tdap vaccine greater than or equal to 7yo IM (Completed)   Encounter for medication monitoring       Relevant Orders   COMPLETE METABOLIC PANEL WITH GFR       Follow up plan: Return in about 4 weeks (around 01/02/2019) for follow-up visit with Dr. Sherie DonLada for weight.  An after-visit summary was printed and given to the patient at check-out.  Please see the patient instructions which may contain other information and recommendations beyond what is mentioned above in the assessment and plan.  Meds ordered this encounter  Medications  . MICROLET LANCETS MISC    Sig: TEST BLOOD SUGAR three times a day  Dx:E11.65 LON:99 months    Dispense:  100 each    Refill:  11  . LEVEMIR FLEXTOUCH 100 UNIT/ML Pen    Sig: Inject 10 Units into the skin daily at 10 pm.    Dispense:  15 mL    Refill:  5  . lisinopril (PRINIVIL,ZESTRIL) 10 MG tablet    Sig: Take 1 tablet (10 mg total) by mouth daily. (lower dose)    Dispense:  90 tablet    Refill:  1    Lower dose, cancel any  outstanding refills of the 20 mg strength  . metFORMIN (GLUCOPHAGE) 500 MG tablet    Sig: Take 1 tablet (500 mg total) by mouth daily with breakfast.    Orders Placed This Encounter  Procedures  . DG Chest 2 View  . CT Chest Wo Contrast  . Tdap vaccine greater than or equal to 7yo IM  . HIV antibody (with reflex)  . Hepatitis C Antibody  . Hemoglobin A1c  . COMPLETE METABOLIC PANEL WITH GFR  . TSH  . T4, free  . CBC with Differential/Platelet

## 2018-12-05 NOTE — Telephone Encounter (Signed)
-----   Message from Kerman PasseyMelinda P Lada, MD sent at 12/05/2018  3:11 PM EST ----- John MuirJamie, please let the patient know that his chest xray did not show any pneumonia or infection in his lungs. However, he does have compression deformities in his bones in his mid-back. I don't see a hx of compression fractures or serious accident in his history or problem list. Please ask if he has ever hurt his back significantly (car accident, fall, etc.) and if so did they ever do xrays to look to see if there was a fracture. Please ORDER a DEXA scan, dx: thoracic compression fracture (I just added to problem list)

## 2018-12-05 NOTE — Progress Notes (Signed)
Cala BradfordKimberly, please let the patient know that his CBC is normal; other labs pending at the time of my note

## 2018-12-05 NOTE — Patient Instructions (Addendum)
I do encourage you to quit smoking Call [phone] to sign up for smoking cessation classes You can call 1-800-QUIT-NOW to talk with a smoking cessation coach  Try to limit egg yolks to no more than 3 per week  Decrease your blood pressure pill called lisinopril from 20 mg daily to just 10 mg daily  Have the chest xray done across the street today We'll schedule you for a chest CT  Decrease your insulin to just TEN units a day If sugars are less than 80, decrease further to just FIVE units a day   Health Risks of Smoking Smoking cigarettes is very bad for your health. Tobacco smoke has over 200 known poisons in it. It contains the poisonous gases nitrogen oxide and carbon monoxide. There are over 60 chemicals in tobacco smoke that cause cancer. Smoking is difficult to quit because a chemical in tobacco, called nicotine, causes addiction or dependence. When you smoke and inhale, nicotine is absorbed rapidly into the bloodstream through your lungs. Both inhaled and non-inhaled nicotine may be addictive. What are the risks of cigarette smoke? Cigarette smokers have an increased risk of many serious medical problems, including:  Lung cancer.  Lung disease, such as pneumonia, bronchitis, and emphysema.  Chest pain (angina) and heart attack because the heart is not getting enough oxygen.  Heart disease and peripheral blood vessel disease.  High blood pressure (hypertension).  Stroke.  Oral cancer, including cancer of the lip, mouth, or voice box.  Bladder cancer.  Pancreatic cancer.  Cervical cancer.  Pregnancy complications, including premature birth.  Stillbirths and smaller newborn babies, birth defects, and genetic damage to sperm.  Early menopause.  Lower estrogen level for women.  Infertility.  Facial wrinkles.  Blindness.  Increased risk of broken bones (fractures).  Senile dementia.  Stomach ulcers and internal bleeding.  Delayed wound healing and  increased risk of complications during surgery.  Even smoking lightly shortens your life expectancy by several years. Because of secondhand smoke exposure, children of smokers have an increased risk of the following:  Sudden infant death syndrome (SIDS).  Respiratory infections.  Lung cancer.  Heart disease.  Ear infections. What are the benefits of quitting? There are many health benefits of quitting smoking. Here are some of them:  Within days of quitting smoking, your risk of having a heart attack decreases, your blood flow improves, and your lung capacity improves. Blood pressure, pulse rate, and breathing patterns start returning to normal soon after quitting.  Within months, your lungs may clear up completely.  Quitting for 10 years reduces your risk of developing lung cancer and heart disease to almost that of a nonsmoker.  People who quit may see an improvement in their overall quality of life. How do I quit smoking?     Smoking is an addiction with both physical and psychological effects, and longtime habits can be hard to change. Your health care provider can recommend:  Programs and community resources, which may include group support, education, or talk therapy.  Prescription medicines to help reduce cravings.  Nicotine replacement products, such as patches, gum, and nasal sprays. Use these products only as directed. Do not replace cigarette smoking with electronic cigarettes, which are commonly called e-cigarettes. The safety of e-cigarettes is not known, and some may contain harmful chemicals.  A combination of two or more of these methods. Where to find more information  American Lung Association: www.lung.org  American Cancer Society: www.cancer.org Summary  Smoking cigarettes is very bad for  your health. Cigarette smokers have an increased risk of many serious medical problems, including several cancers, heart disease, and stroke.  Smoking is an  addiction with both physical and psychological effects, and longtime habits can be hard to change.  By stopping right away, you can greatly reduce the risk of medical problems for you and your family.  To help you quit smoking, your health care provider can recommend programs, community resources, prescription medicines, and nicotine replacement products such as patches, gum, and nasal sprays. This information is not intended to replace advice given to you by your health care provider. Make sure you discuss any questions you have with your health care provider. Document Released: 12/22/2004 Document Revised: 02/15/2018 Document Reviewed: 11/18/2016 Elsevier Interactive Patient Education  2019 [employer].  Steps to Quit Smoking  Smoking tobacco can be bad for your health. It can also affect almost every organ in your body. Smoking puts you and people around you at risk for many serious long-lasting (chronic) diseases. Quitting smoking is hard, but it is one of the best things that you can do for your health. It is never too late to quit. What are the benefits of quitting smoking? When you quit smoking, you lower your risk for getting serious diseases and conditions. They can include:  Lung cancer or lung disease.  Heart disease.  Stroke.  Heart attack.  Not being able to have children (infertility).  Weak bones (osteoporosis) and broken bones (fractures). If you have coughing, wheezing, and shortness of breath, those symptoms may get better when you quit. You may also get sick less often. If you are pregnant, quitting smoking can help to lower your chances of having a baby of low birth weight. What can I do to help me quit smoking? Talk with your doctor about what can help you quit smoking. Some things you can do (strategies) include:  Quitting smoking totally, instead of slowly cutting back how much you smoke over a period of time.  Going to in-person counseling. You are more  likely to quit if you go to many counseling sessions.  Using resources and support systems, such as: ? Agricultural engineer with a Veterinary surgeon. ? Phone quitlines. ? Automotive engineer. ? Support groups or group counseling. ? Text messaging programs. ? Mobile phone apps or applications.  Taking medicines. Some of these medicines may have nicotine in them. If you are pregnant or breastfeeding, do not take any medicines to quit smoking unless your doctor says it is okay. Talk with your doctor about counseling or other things that can help you. Talk with your doctor about using more than one strategy at the same time, such as taking medicines while you are also going to in-person counseling. This can help make quitting easier. What things can I do to make it easier to quit? Quitting smoking might feel very hard at first, but there is a lot that you can do to make it easier. Take these steps:  Talk to your family and friends. Ask them to support and encourage you.  Call phone quitlines, reach out to support groups, or work with a Veterinary surgeon.  Ask people who smoke to not smoke around you.  Avoid places that make you want (trigger) to smoke, such as: ? Bars. ? Parties. ? Smoke-break areas at work.  Spend time with people who do not smoke.  Lower the stress in your life. Stress can make you want to smoke. Try these things to help your stress: ?  Getting regular exercise. ? Deep-breathing exercises. ? Yoga. ? Meditating. ? Doing a body scan. To do this, close your eyes, focus on one area of your body at a time from head to toe, and notice which parts of your body are tense. Try to relax the muscles in those areas.  Download or buy apps on your mobile phone or tablet that can help you stick to your quit plan. There are many free apps, such as QuitGuide from the [employer] developer(Centers for Disease Control and Prevention). You can find more support from smokefree.gov and other websites. This information is  not intended to replace advice given to you by your health care provider. Make sure you discuss any questions you have with your health care provider. Document Released: 09/10/2009 Document Revised: 07/12/2016 Document Reviewed: 03/31/2015 Elsevier Interactive Patient Education  2019 [employer]

## 2018-12-05 NOTE — Assessment & Plan Note (Addendum)
Decrease dose; carry candy or other calories for low sugars; do not drive with low blood sugars

## 2018-12-05 NOTE — Assessment & Plan Note (Signed)
Decrease the insulin to 10 units daily; decrease metformin to just one a day; foot exam UTD; check A1c today

## 2018-12-05 NOTE — Assessment & Plan Note (Signed)
Encouraged smoking cessation; order chest xray and CT since with the weight loss

## 2018-12-05 NOTE — Progress Notes (Signed)
John Howard, please let the patient know that his CMP is normal, sugar is better

## 2018-12-05 NOTE — Assessment & Plan Note (Signed)
Decrease medicine; return in 4 weeks

## 2018-12-06 ENCOUNTER — Telehealth: Payer: Self-pay

## 2018-12-06 LAB — COMPLETE METABOLIC PANEL WITH GFR
AG Ratio: 1.6 (calc) (ref 1.0–2.5)
ALT: 10 U/L (ref 9–46)
AST: 12 U/L (ref 10–35)
Albumin: 4.4 g/dL (ref 3.6–5.1)
Alkaline phosphatase (APISO): 48 U/L (ref 40–115)
BUN: 22 mg/dL (ref 7–25)
CO2: 26 mmol/L (ref 20–32)
Calcium: 10 mg/dL (ref 8.6–10.3)
Chloride: 102 mmol/L (ref 98–110)
Creat: 1.06 mg/dL (ref 0.70–1.25)
GFR, Est African American: 87 mL/min/{1.73_m2} (ref >=60)
GFR, Est Non African American: 75 mL/min/{1.73_m2} (ref >=60)
Globulin: 2.7 g/dL (calc) (ref 1.9–3.7)
Glucose, Bld: 100 mg/dL (ref 65–139)
Potassium: 5.1 mmol/L (ref 3.5–5.3)
Sodium: 138 mmol/L (ref 135–146)
Total Bilirubin: 0.3 mg/dL (ref 0.2–1.2)
Total Protein: 7.1 g/dL (ref 6.1–8.1)

## 2018-12-06 LAB — CBC WITH DIFFERENTIAL/PLATELET
Absolute Monocytes: 308 cells/uL (ref 200–950)
Basophils Absolute: 22 cells/uL (ref 0–200)
Basophils Relative: 0.4 %
Eosinophils Absolute: 22 cells/uL (ref 15–500)
Eosinophils Relative: 0.4 %
HCT: 42.6 % (ref 38.5–50.0)
Hemoglobin: 14.9 g/dL (ref 13.2–17.1)
Lymphs Abs: 1220 cells/uL (ref 850–3900)
MCH: 31.3 pg (ref 27.0–33.0)
MCHC: 35 g/dL (ref 32.0–36.0)
MCV: 89.5 fL (ref 80.0–100.0)
MPV: 10.5 fL (ref 7.5–12.5)
Monocytes Relative: 5.7 %
Neutro Abs: 3829 cells/uL (ref 1500–7800)
Neutrophils Relative %: 70.9 %
Platelets: 286 10*3/uL (ref 140–400)
RBC: 4.76 10*6/uL (ref 4.20–5.80)
RDW: 12.4 % (ref 11.0–15.0)
Total Lymphocyte: 22.6 %
WBC: 5.4 10*3/uL (ref 3.8–10.8)

## 2018-12-06 LAB — HEMOGLOBIN A1C
Hgb A1c MFr Bld: 8 % of total Hgb — ABNORMAL HIGH (ref <5.7)
Mean Plasma Glucose: 183 (calc)
eAG (mmol/L): 10.1 (calc)

## 2018-12-06 LAB — HIV ANTIBODY (ROUTINE TESTING W REFLEX): HIV 1&2 Ab, 4th Generation: NONREACTIVE

## 2018-12-06 LAB — HEPATITIS C ANTIBODY
Hepatitis C Ab: NONREACTIVE
SIGNAL TO CUT-OFF: 0.04 (ref <1.00)

## 2018-12-06 LAB — T4, FREE: Free T4: 1.1 ng/dL (ref 0.8–1.8)

## 2018-12-06 LAB — TSH: TSH: 1.49 mIU/L (ref 0.40–4.50)

## 2018-12-06 NOTE — Progress Notes (Signed)
John Howard, please let the patient know that his hepatitis C test is negative

## 2018-12-06 NOTE — Progress Notes (Signed)
John Howard, please let the patient know that his A1c is coming down, from 10.1 to 8. We still want to get it lower (under 7 is our goal).  Really watch diet. Avoid sugary drinks, white bread, potatoes, white rice. Monitor sugars 2-3 x a day and call next week with readings on the new insulin regimen. Thyroid tests are normal. HIV is negative.

## 2018-12-06 NOTE — Telephone Encounter (Signed)
Copied from CRM [id number]. Topic: General - Other >> Dec 06, 2018  1:10 PM Stovall, Shana A wrote: Reason for CRM: pt wife called in and stated that pt was suppose to have a DEXA scan but it was sch'd out in [place].  She wants to stay somewhere in [place] county.  Please advise  Best number  [phone]

## 2018-12-10 ENCOUNTER — Telehealth: Payer: Self-pay | Admitting: Family Medicine

## 2018-12-10 DIAGNOSIS — Z720 Tobacco use: Secondary | ICD-10-CM

## 2018-12-10 NOTE — Telephone Encounter (Signed)
Copied from CRM [id number]. Topic: Quick Communication - See Telephone Encounter >> Dec 10, 2018  2:54 PM Jay Schlichter wrote: CRM for notification. See Telephone encounter for: 12/10/18. Chest ct was denied by insurance.  Please advise what you would like to do next. I have left a message for provide courtesy review.

## 2018-12-12 ENCOUNTER — Other Ambulatory Visit (HOSPITAL_COMMUNITY): Payer: BLUE CROSS/BLUE SHIELD

## 2018-12-12 NOTE — Telephone Encounter (Signed)
I will need to fax documentation to provider courtesy review before Friday.  Would you want to write a letter of medical necessity to be faxed in? Please advise

## 2018-12-13 NOTE — Telephone Encounter (Signed)
Then we'll order the low dose chest CT screen He has not lost 10% of his body weight, so I don't think they've cover the diagnostic CT scan if they didn't already cover it based on my notes and documentation  Wt Readings from Last 3 Encounters:  12/05/18 134 lb 1.6 oz (60.8 kg)  08/31/18 141 lb 3.2 oz (64 kg)  07/20/18 144 lb 1.6 oz (65.4 kg)   Please contact CT scan coordinator at the cancer center and see if they can expedite this

## 2018-12-14 ENCOUNTER — Telehealth: Payer: Self-pay | Admitting: *Deleted

## 2018-12-14 NOTE — Telephone Encounter (Signed)
I just want to clarify that patient is a candidate per your note and will have the CT scan

## 2018-12-14 NOTE — Telephone Encounter (Signed)
Tried to call pt - no voicemail set up

## 2018-12-14 NOTE — Telephone Encounter (Addendum)
Contacted patient and reviewed smoking history for consideration of lung cancer screening scan. Patient verbalizes smoking no more than .25 packs per day x 43 years. Based on current eligibility requirements, patient is not a candidate for lung cancer screening. Patient verbalizes understanding.

## 2018-12-14 NOTE — Telephone Encounter (Signed)
So patient does not qualify for the low dose chest CT for his smoking history (he does not report 30 pack years) He had a normal CXR Please ask him to see me back in the office in 2-3 weeks for weight check and further work-up if still losing weight and we'll consider further cancer work-up at that time

## 2018-12-17 NOTE — Telephone Encounter (Signed)
Left detailed voicemail on home number to call and setup an appt, also created CRM

## 2018-12-23 ENCOUNTER — Other Ambulatory Visit: Payer: Self-pay | Admitting: Family Medicine

## 2018-12-23 DIAGNOSIS — E119 Type 2 diabetes mellitus without complications: Secondary | ICD-10-CM

## 2018-12-31 LAB — HM DIABETES EYE EXAM

## 2019-01-07 ENCOUNTER — Encounter: Payer: Self-pay | Admitting: Family Medicine

## 2019-01-07 ENCOUNTER — Ambulatory Visit: Payer: BLUE CROSS/BLUE SHIELD | Admitting: Family Medicine

## 2019-01-07 VITALS — BP 96/58 | HR 88 | Temp 97.8°F | Resp 14 | Ht 66.0 in | Wt 137.3 lb

## 2019-01-07 DIAGNOSIS — M353 Polymyalgia rheumatica: Secondary | ICD-10-CM | POA: Diagnosis not present

## 2019-01-07 DIAGNOSIS — Z125 Encounter for screening for malignant neoplasm of prostate: Secondary | ICD-10-CM

## 2019-01-07 DIAGNOSIS — I10 Essential (primary) hypertension: Secondary | ICD-10-CM | POA: Diagnosis not present

## 2019-01-07 DIAGNOSIS — S22000A Wedge compression fracture of unspecified thoracic vertebra, initial encounter for closed fracture: Secondary | ICD-10-CM

## 2019-01-07 DIAGNOSIS — E119 Type 2 diabetes mellitus without complications: Secondary | ICD-10-CM | POA: Diagnosis not present

## 2019-01-07 DIAGNOSIS — E1165 Type 2 diabetes mellitus with hyperglycemia: Secondary | ICD-10-CM

## 2019-01-07 DIAGNOSIS — Z794 Long term (current) use of insulin: Secondary | ICD-10-CM

## 2019-01-07 DIAGNOSIS — Z720 Tobacco use: Secondary | ICD-10-CM

## 2019-01-07 MED ORDER — LISINOPRIL 10 MG PO TABS: 5.0000 mg | ORAL_TABLET | Freq: Every day | ORAL | 1 refills | Status: DC

## 2019-01-07 MED ORDER — GLUCOSE BLOOD VI STRP: ORAL_STRIP | 3 refills | Status: DC

## 2019-01-07 MED ORDER — METFORMIN HCL 500 MG PO TABS: ORAL_TABLET | ORAL | 5 refills | Status: DC

## 2019-01-07 NOTE — Progress Notes (Signed)
BP (!) 96/58   Pulse 88   Temp 97.8 F (36.6 C) (Oral)   Resp 14   Ht 5\' 6"  (1.676 m)   Wt 137 lb 4.8 oz (62.3 kg)   SpO2 97%   BMI 22.16 kg/m    Subjective:    Patient ID: John Howard, male    DOB: 11-23-1956, 63 y.o.   MRN: [id number]  HPI: John Howard is a 62 y.o. male  Chief Complaint  Patient presents with  . Follow-up    HPI Patient is here for f/u  His BP is low today; orthostatics ordered Not taking more than 10 mg lisinopril daily; no other BP medicines; drinking water, staying hydrated Not feeling dizzy or light-headed Orthostatic VS for the past 24 hrs (Last 3 readings):  BP- Lying Pulse- Lying BP- Standing at 0 minutes Pulse- Standing at 0 minutes BP- Standing at 3 minutes Pulse- Standing at 3 minutes  01/07/19 1007 102/58 80 98/58 85 96/56 87    He has polymyalgia rheumatica and is seeing Dr. Tresa Moore, rheumatologist, at [facility]; last visit was Dec 03, 2018; he is currently on 6 mg prednisone daily New vision loss, jaw claudication, tongue fatigue, headache? NO Blood in stools or abdominal pain on the prednisone? NO  He has type 2 diabetes; uncontrolled with A1c of 8 in January Brought in readings; low was 78 and high was 287 over the last week; high was after eating out, seafood No dry mouth (except for right now because he has not drunk anything this morning for labs) Using insulin  Lab Results  Component Value Date   HGBA1C 8.0 (H) 12/05/2018   High cholesterol; last lipids in October showed LDL 56, HDL 66; taking lovastatin  Lab Results  Component Value Date   CHOL 133 08/31/2018   HDL 66 08/31/2018   LDLCALC 56 08/31/2018   TRIG 40 08/31/2018   CHOLHDL 2.0 08/31/2018   He is a current tobacco smoker; smoking 3-4 cigarettes a day Smoking history They canceled the chest CT; he didn't smoke enough, "didn't need it" he says Started smoking around 81 or 63 years old; smoke 1 pack a WEEK for a long time Never smoked a pack a day or  even half of a pack a day He is up 3 pounds over the last month; he is eating okay; he thinks that when he stopped drinking beer, the weight started coming down; he thinks his current weight is where he should be; "ain't never been no big person"; got up to 160 pounds once but that was when he was drinking a lot of beer  Last CXR reviewed; wedge compression seen, has DEXA scan soon; insurance denied chest CT for diagnostic purposes; cancer center said he didn't meet chest CT for screening purposes  Depression screen [facility] 2/9 01/07/2019 12/05/2018 08/31/2018 07/20/2018 07/06/2018  Decreased Interest 0 0 0 0 0  Down, Depressed, Hopeless 0 0 0 0 0  PHQ - 2 Score 0 0 0 0 0  Altered sleeping 0 0 0 - 0  Tired, decreased energy 0 0 0 - 0  Change in appetite 0 0 0 - 0  Feeling bad or failure about yourself  0 0 0 - 0  Trouble concentrating 0 0 0 - 0  Moving slowly or fidgety/restless 0 0 0 - 0  Suicidal thoughts 0 0 0 - 0  PHQ-9 Score 0 0 0 - 0  Difficult doing work/chores Not difficult at all Not difficult  at all - - Not difficult at all   Fall Risk  01/07/2019 12/05/2018 08/31/2018 07/20/2018 07/06/2018  Falls in the past year? 0 0 No No No  Number falls in past yr: 0 0 - - -  Injury with Fall? 0 0 - - -    Relevant past medical, surgical, family and social history reviewed Past Medical History:  Diagnosis Date  . Arthritis    fingers  . Diabetes mellitus without complication (HCC)   . Hyperlipidemia   . Hypertension   . Thoracic compression fracture (HCC) 12/05/2018   Xray Jan 2020  . Wears dentures    full upper and lower   Past Surgical History:  Procedure Laterality Date  . COLONOSCOPY    . COLONOSCOPY WITH PROPOFOL N/A 03/21/2016   Procedure: COLONOSCOPY WITH PROPOFOL;  Surgeon: Midge Miniumarren Wohl, MD;  [facility];  Service: Endoscopy;  Laterality: N/A;  Diabetic - insulin  . EYE SURGERY     Family History  Problem Relation Age of Onset  . Hypertension Mother   . Diabetes  Mother    Social History   Tobacco Use  . Smoking status: Current Some Day Smoker    Packs/day: 0.25    Years: 35.00    Pack years: 8.75    Types: Cigarettes  . Smokeless tobacco: Never Used  . Tobacco comment: 3/cig per day   Substance Use Topics  . Alcohol use: Not Currently    Alcohol/week: 1.0 standard drinks    Types: 1 Cans of beer per week    Comment: pt is trying to quit, has not had a drink in a month  . Drug use: No  MD note: he quit drinking in April 2019    [facility]  AUDIT-C Score  0      Interim medical history since last visit reviewed. Allergies and medications reviewed  Review of Systems  Constitutional: Negative for fever and unexpected weight change.  HENT: Negative for postnasal drip.   Respiratory: Positive for cough (with a little phlegm, clear; just occasional).   Gastrointestinal: Negative for blood in stool and nausea.  Genitourinary: Negative for hematuria.   Per HPI unless specifically indicated above     Objective:    BP (!) 96/58   Pulse 88   Temp 97.8 F (36.6 C) (Oral)   Resp 14   Ht 5\' 6"  (1.676 m)   Wt 137 lb 4.8 oz (62.3 kg)   SpO2 97%   BMI 22.16 kg/m   Wt Readings from Last 3 Encounters:  01/07/19 137 lb 4.8 oz (62.3 kg)  12/05/18 134 lb 1.6 oz (60.8 kg)  08/31/18 141 lb 3.2 oz (64 kg)    Physical Exam Constitutional:      General: He is not in acute distress.    Appearance: He is well-developed.  HENT:     Head: Normocephalic and atraumatic.  Eyes:     General: No scleral icterus. Neck:     Thyroid: No thyromegaly.  Cardiovascular:     Rate and Rhythm: Normal rate and regular rhythm.  Pulmonary:     Effort: Pulmonary effort is normal.     Breath sounds: Normal breath sounds.  Abdominal:     General: Bowel sounds are normal. There is no distension.     Palpations: Abdomen is soft.  Skin:    General: Skin is warm and dry.     Coloration: Skin is not pale.  Neurological:     Mental Status: He is alert.     Coordination: Coordination normal.  Psychiatric:        Behavior: Behavior normal.        Thought Content: Thought content normal.        Judgment: Judgment normal.    Diabetic Foot Form - Detailed   Diabetic Foot Exam - detailed Diabetic Foot exam was performed with the following findings:  Yes 01/07/2019  2:12 PM  Visual Foot Exam completed.:  Yes  Pulse Foot Exam completed.:  Yes  Right Dorsalis Pedis:  Present Left Dorsalis Pedis:  Present  Sensory Foot Exam Completed.:  Yes Semmes-Weinstein Monofilament Test R Site 1-Great Toe:  Pos L Site 1-Great Toe:  Pos         Results for orders placed or performed in visit on 01/01/19  HM DIABETES EYE EXAM  Result Value Ref Range   HM Diabetic Eye Exam Retinopathy (A) No Retinopathy      Assessment & Plan:   Problem List Items Addressed This Visit      Cardiovascular and Mediastinum   Essential hypertension (Chronic)    Decrease blood pressure medicine; patient did not want to come back, will check his own BP and contact me in a few weeks with readings; will plan to decrease further if still hypotensive      Relevant Medications   lisinopril (PRINIVIL,ZESTRIL) 10 MG tablet     Endocrine   DM (diabetes mellitus), type 2, uncontrolled (HCC) - Primary    Encouraged healthy eating; foot exam by MD      Relevant Medications   lisinopril (PRINIVIL,ZESTRIL) 10 MG tablet   metFORMIN (GLUCOPHAGE) 500 MG tablet   glucose blood (BAYER CONTOUR TEST) test strip     Musculoskeletal and Integument   Thoracic compression fracture (HCC)    Noted on xray; going to have DEXA soon        Other   Tobacco use    Urged patient to quit smoking; chest xray recently done; he does not qualify for screening chest CT; I tried to get a chest CT but insurance declined it; we reviewed his plain film report; contact me if any further weight loss      Polymyalgia rheumatica syndrome (HCC)     Managed by rheumatologist; on long-term steroid treatment at this time      Long-term insulin use (HCC)    Patient is monitoring sugars closey, BID; brought readings with him       Other Visit Diagnoses    Type 2 diabetes mellitus without complication, without long-term current use of insulin (HCC)       Relevant Medications   lisinopril (PRINIVIL,ZESTRIL) 10 MG tablet   metFORMIN (GLUCOPHAGE) 500 MG tablet   glucose blood (BAYER CONTOUR TEST) test strip   Screening for prostate cancer       Relevant Orders   PSA       Follow up plan: Return in about 8 weeks (around 03/07/2019) for follow-up visit with Dr. Sherie Don or just AFTER, nonfasting.  An after-visit summary was printed and given to the patient at check-out.  Please see the patient instructions which may contain other information and recommendations beyond what is mentioned above in the assessment and plan.  Meds ordered this encounter  Medications  . lisinopril (PRINIVIL,ZESTRIL) 10 MG tablet    Sig: Take 0.5 tablets (5 mg total) by mouth daily. (lower dose)    Dispense:  45 tablet  Refill:  1    Lower dose  . metFORMIN (GLUCOPHAGE) 500 MG tablet    Sig: One by mouth every morning and two every evening    Dispense:  90 tablet    Refill:  5  . glucose blood (BAYER CONTOUR TEST) test strip    Sig: Use as directed to check blood glucose twice daily; LON 99 months; Dx E11.65    Dispense:  180 each    Refill:  3    Orders Placed This Encounter  Procedures  . PSA

## 2019-01-07 NOTE — Assessment & Plan Note (Signed)
Patient is monitoring sugars closey, BID; brought readings with him

## 2019-01-07 NOTE — Assessment & Plan Note (Signed)
Decrease blood pressure medicine; patient did not want to come back, will check his own BP and contact me in a few weeks with readings; will plan to decrease further if still hypotensive

## 2019-01-07 NOTE — Assessment & Plan Note (Signed)
Managed by rheumatologist; on long-term steroid treatment at this time

## 2019-01-07 NOTE — Patient Instructions (Addendum)
Decrease the lisinopril from 10 mg daily to 5 mg daily Check your blood pressure daily and call me in one week with the readings Stay well-hydrated  I do encourage you to quit smoking Call [phone] to sign up for smoking cessation classes You can call 1-800-QUIT-NOW to talk with a smoking cessation coach

## 2019-01-07 NOTE — Assessment & Plan Note (Signed)
Noted on xray; going to have DEXA soon

## 2019-01-07 NOTE — Assessment & Plan Note (Signed)
Encouraged healthy eating; foot exam by MD

## 2019-01-07 NOTE — Assessment & Plan Note (Addendum)
Urged patient to quit smoking; chest xray recently done; he does not qualify for screening chest CT; I tried to get a chest CT but insurance declined it; we reviewed his plain film report; contact me if any further weight loss

## 2019-01-08 ENCOUNTER — Telehealth: Payer: Self-pay

## 2019-01-08 LAB — PSA: PSA: 1.8 ng/mL (ref <=4.0)

## 2019-01-08 NOTE — Telephone Encounter (Signed)
Patients wife notified

## 2019-01-08 NOTE — Telephone Encounter (Signed)
Copied from CRM [id number]. Topic: General - Other >> Jan 08, 2019  9:50 AM Gaynelle Adu wrote: Reason for CRM:  Patient  wife is  calling to speak with a nurse in regards to her husbands  last OV on 01-07-2019. Please advise best contact number is [phone].

## 2019-01-23 ENCOUNTER — Ambulatory Visit
Admission: RE | Admit: 2019-01-23 | Discharge: 2019-01-23 | Disposition: A | Discharge status: Home / Self Care | Payer: BLUE CROSS/BLUE SHIELD | Source: Ambulatory Visit | Attending: Family Medicine | Admitting: Family Medicine

## 2019-01-23 DIAGNOSIS — S22000A Wedge compression fracture of unspecified thoracic vertebra, initial encounter for closed fracture: Secondary | ICD-10-CM | POA: Insufficient documentation

## 2019-01-24 NOTE — Progress Notes (Signed)
Cala Bradford, please let the patient know that his bone density is "normal" but it's really close to showing osteopenia. The cutoff is negative one. Anything less than negative one is osteopenia and he is right there at negative one. We'll want him to get three servings a day of calcium (spinach, kale, broccoli, almond milk, etc.) and plenty of weight-bearing exercise like walking and gardening. We'll recheck his bone density in 2-3 years.

## 2019-02-21 ENCOUNTER — Other Ambulatory Visit: Payer: Self-pay | Admitting: Family Medicine

## 2019-03-04 ENCOUNTER — Ambulatory Visit: Payer: BLUE CROSS/BLUE SHIELD | Admitting: Family Medicine

## 2019-03-07 ENCOUNTER — Other Ambulatory Visit: Payer: Self-pay | Admitting: Family Medicine

## 2019-03-07 NOTE — Telephone Encounter (Signed)
Lab Results  Component Value Date   CREATININE 1.06 12/05/2018

## 2019-03-08 ENCOUNTER — Ambulatory Visit (INDEPENDENT_AMBULATORY_CARE_PROVIDER_SITE_OTHER): Payer: BLUE CROSS/BLUE SHIELD | Admitting: Family Medicine

## 2019-03-08 ENCOUNTER — Telehealth: Payer: Self-pay | Admitting: Family Medicine

## 2019-03-08 ENCOUNTER — Other Ambulatory Visit: Payer: Self-pay

## 2019-03-08 ENCOUNTER — Encounter: Payer: Self-pay | Admitting: Family Medicine

## 2019-03-08 DIAGNOSIS — E1165 Type 2 diabetes mellitus with hyperglycemia: Secondary | ICD-10-CM

## 2019-03-08 DIAGNOSIS — I10 Essential (primary) hypertension: Secondary | ICD-10-CM

## 2019-03-08 DIAGNOSIS — E782 Mixed hyperlipidemia: Secondary | ICD-10-CM | POA: Diagnosis not present

## 2019-03-08 DIAGNOSIS — M353 Polymyalgia rheumatica: Secondary | ICD-10-CM | POA: Diagnosis not present

## 2019-03-08 DIAGNOSIS — Z5181 Encounter for therapeutic drug level monitoring: Secondary | ICD-10-CM | POA: Diagnosis not present

## 2019-03-08 DIAGNOSIS — Z794 Long term (current) use of insulin: Secondary | ICD-10-CM

## 2019-03-08 NOTE — Progress Notes (Signed)
There were no vitals taken for this visit.   Subjective:    Patient ID: John Howard, male    DOB: 11/18/1956, 63 y.o.   MRN: [id number]  HPI: John Howard is a 63 y.o. male  Chief Complaint  Patient presents with  . Follow-up    HPI Virtual Visit via Telephone/Video Note   I connected with the patient via: telephone I verified that I am speaking with the correct person using two identifiers.  Call started: 8:18 am Call terminated: 8:30 am  Total length of call: 12 minutes 37 seconds   I discussed the limitations, risks, security, and privacy concerns of performing an evaluation and management service by telephone and the availability of in-person appointments. Staff discussed with the patient that he/she may be responsible for charges related to this service. The patient expressed understanding and agreed to proceed.  Patient location: home Provider location: home Additional participants: no one, wife there by may overhear and that's okay  Type 2 diabetes; he says it's been doing alright; FSBS 102 last night, 68 this morning; did not feel jittery at all; no problems with the feet; just had an eye exam in February; wheat spaghetti instead of regular; salads too  Lab Results  Component Value Date   HGBA1C 8.0 (H) 12/05/2018    Hypertension; does not have a recent BP, but does have a machine at home; not adding salt to food, just every now and then  High cholesterol; taking statin without any problems; trying to avoid red meat Lab Results  Component Value Date   CHOL 133 08/31/2018   HDL 66 08/31/2018   LDLCALC 56 08/31/2018   TRIG 40 08/31/2018   CHOLHDL 2.0 08/31/2018    He sees rheumatologist; prednisone is down to 1 mg now; pain has improved; going to see her and have labs soon; PMR  He smoked one cig on Saturday and none since; he is trying to quit  [facility]  [facility] 2/[facility] 01/07/2019 12/05/2018 08/31/2018 07/20/2018 07/06/2018   Decreased Interest 0 0 0 0 0  Down, Depressed, Hopeless 0 0 0 0 0  PHQ - 2 Score 0 0 0 0 0  Altered sleeping 0 0 0 - 0  Tired, decreased energy 0 0 0 - 0  Change in appetite 0 0 0 - 0  Feeling bad or failure about yourself  0 0 0 - 0  Trouble concentrating 0 0 0 - 0  Moving slowly or fidgety/restless 0 0 0 - 0  Suicidal thoughts 0 0 0 - 0  PHQ-9 Score 0 0 0 - 0  Difficult doing work/chores Not difficult at all Not difficult at all - - Not difficult at all   Fall Risk  01/07/2019 12/05/2018 08/31/2018 07/20/2018 07/06/2018  Falls in the past year? 0 0 No No No  Number falls in past yr: 0 0 - - -  Injury with Fall? 0 0 - - -    Relevant past medical, surgical, family and social history reviewed Past Medical History:  Diagnosis Date  . Arthritis    fingers  . Diabetes mellitus without complication (HCC)   . Hyperlipidemia   . Hypertension   . Thoracic compression fracture (HCC) 12/05/2018   Xray Jan 2020  . Wears dentures    full upper and lower   Past Surgical History:  Procedure Laterality Date  . COLONOSCOPY    . COLONOSCOPY WITH PROPOFOL N/A 03/21/2016  Procedure: COLONOSCOPY WITH PROPOFOL;  Surgeon: Midge Minium, MD;  [facility];  Service: Endoscopy;  Laterality: N/A;  Diabetic - insulin  . EYE SURGERY     Family History  Problem Relation Age of Onset  . Hypertension Mother   . Diabetes Mother    Social History   Tobacco Use  . Smoking status: Current Some Day Smoker    Packs/day: 0.25    Years: 35.00    Pack years: 8.75    Types: Cigarettes  . Smokeless tobacco: Never Used  . Tobacco comment: 3/cig per day   Substance Use Topics  . Alcohol use: Not Currently    Alcohol/week: 1.0 standard drinks    Types: 1 Cans of beer per week    Comment: pt is trying to quit, has not had a drink in a month  . Drug use: No     Office Visit from 01/07/2019 in [facility]  AUDIT-C Score  0      Interim medical history since last visit  reviewed. Allergies and medications reviewed  Review of Systems Per HPI unless specifically indicated above     Objective:    There were no vitals taken for this visit.  Wt Readings from Last 3 Encounters:  01/07/19 137 lb 4.8 oz (62.3 kg)  12/05/18 134 lb 1.6 oz (60.8 kg)  08/31/18 141 lb 3.2 oz (64 kg)    Physical Exam Pulmonary:     Effort: No respiratory distress.  Neurological:     Mental Status: He is alert.  Psychiatric:        Speech: Speech is not rapid and pressured, delayed or slurred.     Results for orders placed or performed in visit on 01/07/19  PSA  Result Value Ref Range   PSA 1.8 < OR = 4.0 ng/mL      Assessment & Plan:   Problem List Items Addressed This Visit      Cardiovascular and Mediastinum   Essential hypertension (Chronic)    Check BP at home and call if over 130 mmHg systolic; limit salt; continue ACE-I        Endocrine   DM (diabetes mellitus), type 2, uncontrolled (HCC) - Primary    We'll get labs in another month or later based on COVID-19, hand wash, don't touch face, when coming in for labs; eye exam UTD; check feet every night, let me know if any infection; check A1c next month      Relevant Orders   Microalbumin / creatinine urine ratio   Hemoglobin A1c     Other   Medication monitoring encounter   Relevant Orders   COMPLETE METABOLIC PANEL WITH GFR   Long-term insulin use (HCC)   Polymyalgia rheumatica syndrome (HCC)    Seeing rheum; on low dose prednisone and going to have labs soon      Hyperlipidemia (Chronic)    Continue statin; limit red meat, cheese, eggs, fried foods      Relevant Orders   Lipid panel       Follow up plan: Return in about 4 months (around 07/08/2019) for follow-up visit with Dr. Sherie Don; one month for labs one.  No orders of the defined types were placed in this encounter.   Orders Placed This Encounter  Procedures  . Microalbumin / creatinine urine ratio  . Lipid panel  . Hemoglobin  A1c  . COMPLETE METABOLIC PANEL WITH GFR

## 2019-03-08 NOTE — Assessment & Plan Note (Signed)
We'll get labs in another month or later based on COVID-19, hand wash, don't touch face, when coming in for labs; eye exam UTD; check feet every night, let me know if any infection; check A1c next month

## 2019-03-08 NOTE — Assessment & Plan Note (Signed)
Seeing rheum; on low dose prednisone and going to have labs soon

## 2019-03-08 NOTE — Assessment & Plan Note (Signed)
Check BP at home and call if over 130 mmHg systolic; limit salt; continue ACE-I

## 2019-03-08 NOTE — Telephone Encounter (Signed)
Please schedule patient for a follow-up appointment with Dr. Sherie Don On or after July 08, 2019 Dx; diabetes, hypertension

## 2019-03-08 NOTE — Telephone Encounter (Signed)
done

## 2019-03-08 NOTE — Assessment & Plan Note (Signed)
Continue statin; limit red meat, cheese, eggs, fried foods

## 2019-03-14 ENCOUNTER — Other Ambulatory Visit: Payer: Self-pay

## 2019-03-14 DIAGNOSIS — Z5181 Encounter for therapeutic drug level monitoring: Secondary | ICD-10-CM

## 2019-03-14 DIAGNOSIS — E782 Mixed hyperlipidemia: Secondary | ICD-10-CM

## 2019-03-14 DIAGNOSIS — E1165 Type 2 diabetes mellitus with hyperglycemia: Secondary | ICD-10-CM

## 2019-03-15 LAB — COMPLETE METABOLIC PANEL WITH GFR
AG Ratio: 1.7 (calc) (ref 1.0–2.5)
ALT: 9 U/L (ref 9–46)
AST: 11 U/L (ref 10–35)
Albumin: 4.3 g/dL (ref 3.6–5.1)
Alkaline phosphatase (APISO): 50 U/L (ref 35–144)
BUN: 18 mg/dL (ref 7–25)
CO2: 25 mmol/L (ref 20–32)
Calcium: 9.8 mg/dL (ref 8.6–10.3)
Chloride: 101 mmol/L (ref 98–110)
Creat: 1.18 mg/dL (ref 0.70–1.25)
GFR, Est African American: 76 mL/min/{1.73_m2} (ref >=60)
GFR, Est Non African American: 66 mL/min/{1.73_m2} (ref >=60)
Globulin: 2.6 g/dL (calc) (ref 1.9–3.7)
Glucose, Bld: 192 mg/dL — ABNORMAL HIGH (ref 65–99)
Potassium: 4 mmol/L (ref 3.5–5.3)
Sodium: 138 mmol/L (ref 135–146)
Total Bilirubin: 0.4 mg/dL (ref 0.2–1.2)
Total Protein: 6.9 g/dL (ref 6.1–8.1)

## 2019-03-15 LAB — LIPID PANEL
Cholesterol: 122 mg/dL (ref <200)
HDL: 51 mg/dL (ref >=40)
LDL Cholesterol (Calc): 54 mg/dL (calc)
Non-HDL Cholesterol (Calc): 71 mg/dL (calc) (ref <130)
Total CHOL/HDL Ratio: 2.4 (calc) (ref <5.0)
Triglycerides: 88 mg/dL (ref <150)

## 2019-03-15 LAB — MICROALBUMIN / CREATININE URINE RATIO
Creatinine, Urine: 95 mg/dL (ref 20–320)
Microalb Creat Ratio: 4 mcg/mg creat (ref <30)
Microalb, Ur: 0.4 mg/dL

## 2019-03-15 LAB — HEMOGLOBIN A1C
Hgb A1c MFr Bld: 7.1 % of total Hgb — ABNORMAL HIGH (ref <5.7)
Mean Plasma Glucose: 157 (calc)
eAG (mmol/L): 8.7 (calc)

## 2019-03-15 NOTE — Progress Notes (Signed)
John Howard, please let the patient know that his 3 month blood sugar average has really improved. He is almost to goal! Keep working on watching his diet. His cholesterol is perfect. He is not spilling excess protein through his kidneys. Good news. Thank you

## 2019-03-23 ENCOUNTER — Other Ambulatory Visit: Payer: Self-pay | Admitting: Family Medicine

## 2019-04-05 ENCOUNTER — Other Ambulatory Visit: Payer: Self-pay | Admitting: Family Medicine

## 2019-04-05 DIAGNOSIS — E119 Type 2 diabetes mellitus without complications: Secondary | ICD-10-CM

## 2019-05-16 ENCOUNTER — Other Ambulatory Visit: Payer: Self-pay | Admitting: Family Medicine

## 2019-05-18 ENCOUNTER — Other Ambulatory Visit: Payer: Self-pay | Admitting: Family Medicine

## 2019-05-18 DIAGNOSIS — E7800 Pure hypercholesterolemia, unspecified: Secondary | ICD-10-CM

## 2019-05-30 ENCOUNTER — Other Ambulatory Visit: Payer: Self-pay | Admitting: Family Medicine

## 2019-07-08 ENCOUNTER — Ambulatory Visit: Payer: BLUE CROSS/BLUE SHIELD | Admitting: Family Medicine

## 2019-07-10 ENCOUNTER — Other Ambulatory Visit: Payer: Self-pay | Admitting: Nurse Practitioner

## 2019-07-10 DIAGNOSIS — E119 Type 2 diabetes mellitus without complications: Secondary | ICD-10-CM

## 2019-07-12 ENCOUNTER — Ambulatory Visit (INDEPENDENT_AMBULATORY_CARE_PROVIDER_SITE_OTHER): Payer: BC Managed Care – PPO | Admitting: Nurse Practitioner

## 2019-07-12 ENCOUNTER — Encounter: Payer: Self-pay | Admitting: Nurse Practitioner

## 2019-07-12 ENCOUNTER — Other Ambulatory Visit: Payer: Self-pay

## 2019-07-12 DIAGNOSIS — E782 Mixed hyperlipidemia: Secondary | ICD-10-CM

## 2019-07-12 DIAGNOSIS — I10 Essential (primary) hypertension: Secondary | ICD-10-CM | POA: Diagnosis not present

## 2019-07-12 DIAGNOSIS — E119 Type 2 diabetes mellitus without complications: Secondary | ICD-10-CM

## 2019-07-12 DIAGNOSIS — E1165 Type 2 diabetes mellitus with hyperglycemia: Secondary | ICD-10-CM | POA: Diagnosis not present

## 2019-07-12 DIAGNOSIS — M353 Polymyalgia rheumatica: Secondary | ICD-10-CM | POA: Diagnosis not present

## 2019-07-12 DIAGNOSIS — E7800 Pure hypercholesterolemia, unspecified: Secondary | ICD-10-CM

## 2019-07-12 DIAGNOSIS — K219 Gastro-esophageal reflux disease without esophagitis: Secondary | ICD-10-CM

## 2019-07-12 DIAGNOSIS — Z720 Tobacco use: Secondary | ICD-10-CM

## 2019-07-12 MED ORDER — FAMOTIDINE 20 MG PO TABS: ORAL_TABLET | ORAL | 3 refills | Status: DC

## 2019-07-12 MED ORDER — METFORMIN HCL 500 MG PO TABS: ORAL_TABLET | ORAL | 1 refills | Status: DC

## 2019-07-12 MED ORDER — LOVASTATIN 20 MG PO TABS: 20.0000 mg | ORAL_TABLET | Freq: Every day | ORAL | 1 refills | Status: DC

## 2019-07-12 MED ORDER — STEGLATRO 5 MG PO TABS: 5.0000 mg | ORAL_TABLET | Freq: Every day | ORAL | 1 refills | Status: DC

## 2019-07-12 MED ORDER — GABAPENTIN 400 MG PO CAPS: ORAL_CAPSULE | ORAL | 1 refills | Status: DC

## 2019-07-12 NOTE — Progress Notes (Signed)
Virtual Visit via Telephone Note  I connected with John Howard on 07/12/19 at 10:20 AM EDT by telephone and verified that I am speaking with the correct person using two identifiers.   Staff discussed the limitations, risks, security and privacy concerns of performing an evaluation and management service by telephone and the availability of in person appointments. Staff also discussed with the patient that there may be a patient responsible charge related to this service. The patient expressed understanding and agreed to proceed.  Patients location:  My location: work office  Other people in meeting: none    HPI Diabetes Mellitus Patient is rx metformin 2024m daily, steglatro 525mdaily, 10 units of levemir at night. Takes medications as prescribed with no missed doses a month.  Has neuropathy in bilateral legs is taking 1 capsule at night time- works well. He is on ACEi and statin.  Checks blood sugars between 130-140 before bedtime and 80-118 fasting.   Denies polyphagia, polydipsia, polyuria.  Lab Results  Component Value Date   HGBA1C 7.1 (H) 03/14/2019   Hyperlipidemia Patient rx lovastatin 2071mightly. Takes medications as prescribed with no missed doses a month.  Diet: eats lots of vegetables, avoids fried meats. Rarely red meats.  Denies myalgias Lab Results  Component Value Date   CHOL 122 03/14/2019   HDL 51 03/14/2019   LDLCALC 54 03/14/2019   TRIG 88 03/14/2019   CHOLHDL 2.4 03/14/2019   Hypertension Patient is on lisinopril 30m37mily; he was cut down from 20mg79mly due to hypotension- no dizziness or lightheadedness since then. Unable to check BP at home due to no batteries in machine.  Takes medications as prescribed with no missed doses a month.  He is compliant with low-salt diet.  Denies chest pain, headaches, blurry vision.  BP Readings from Last 3 Encounters:  01/07/19 (!) 96/58  12/05/18 102/60  08/31/18 122/60   Polymyagia rheumatica He is  taking daily low dose prednisone with good control of pain He sees rheumatology- Dr. BehlaJenny ReichmannD Taking pepcid every morning, controls reflux well.   PHQ2/9: [facility] screen [facility] 01/07/2019 12/05/2018 08/31/2018 07/20/2018  Decreased Interest 0 0 0 0 0  Down, Depressed, Hopeless 0 0 0 0 0  PHQ - 2 Score 0 0 0 0 0  Altered sleeping 0 0 0 0 -  Tired, decreased energy 0 0 0 0 -  Change in appetite 0 0 0 0 -  Feeling bad or failure about yourself  0 0 0 0 -  Trouble concentrating 0 0 0 0 -  Moving slowly or fidgety/restless 0 0 0 0 -  Suicidal thoughts 0 0 0 0 -  PHQ-9 Score 0 0 0 0 -  Difficult doing work/chores Not difficult at all Not difficult at all Not difficult at all - -     PHQ reviewed. Negative  Patient Active Problem List   Diagnosis Date Noted  . Thoracic compression fracture (HCC) [place]08/2020  . Long-term insulin use (HCC) [place]14/2019  . Polymyalgia rheumatica syndrome (HCC) [place]28/2019  . Elevated C-reactive protein (CRP) 07/20/2018  . Carpal tunnel syndrome of right wrist 06/26/2018  . Medication monitoring encounter 02/28/2018  . Tobacco use 02/28/2018  . Osteoarthritis of right hand 02/28/2018  . Chronic right shoulder pain 11/23/2016  . Vitamin D insufficiency 05/27/2016  . Special screening for malignant neoplasms, colon   . Second degree hemorrhoids   . Annual physical exam 02/22/2016  . DM (diabetes mellitus), type 2, uncontrolled (HCC)[place]  06/23/2015  . Hyperlipidemia 06/23/2015  . Essential hypertension 06/23/2015  . Blind left eye 06/23/2015    Past Medical History:  Diagnosis Date  . Arthritis    fingers  . Diabetes mellitus without complication ([place])   . Hyperlipidemia   . Hypertension   . Thoracic compression fracture ([place]) 12/05/2018   Xray Jan 2020  . Wears dentures    full upper and lower    Past Surgical History:  Procedure Laterality Date  . COLONOSCOPY    . COLONOSCOPY WITH PROPOFOL N/A 03/21/2016   Procedure:  COLONOSCOPY WITH PROPOFOL;  Surgeon: Lucilla Lame, MD;  Location: [place];  Service: Endoscopy;  Laterality: N/A;  Diabetic - insulin  . EYE SURGERY      Social History   Tobacco Use  . Smoking status: Current Some Day Smoker    Packs/day: 0.25    Years: 35.00    Pack years: 8.75    Types: Cigarettes  . Smokeless tobacco: Never Used  . Tobacco comment: 3/cig per day   Substance Use Topics  . Alcohol use: Not Currently    Alcohol/week: 1.0 standard drinks    Types: 1 Cans of beer per week    Comment: pt is trying to quit, has not had a drink in a month     Current Outpatient Medications:  .  aspirin 81 MG tablet, Take 81 mg by mouth daily., Disp: , Rfl:  .  Cholecalciferol (VITAMIN D PO), Take 25 mg by mouth., Disp: , Rfl:  .  famotidine (PEPCID) 20 MG tablet, TAKE 1 TABLET BY MOUTH TWICE DAILY AS NEEDED FOR HEARTBURN OR INDEGESTION, Disp: 60 tablet, Rfl: 2 .  gabapentin (NEURONTIN) 400 MG capsule, TAKE 1 CAPSULE(400 MG) BY MOUTH AT BEDTIME, Disp: 90 capsule, Rfl: 1 .  LEVEMIR FLEXTOUCH 100 UNIT/ML Pen, Inject 10 Units into the skin daily at 10 pm., Disp: 15 mL, Rfl: 5 .  lisinopril (ZESTRIL) 10 MG tablet, TAKE 1 TABLET BY MOUTH EVERY DAY( LOWER DOSE), Disp: 90 tablet, Rfl: 1 .  lovastatin (MEVACOR) 20 MG tablet, TAKE 1 TABLET BY MOUTH AT BEDTIME, Disp: 90 tablet, Rfl: 1 .  metFORMIN (GLUCOPHAGE) 500 MG tablet, TAKE 2 TABLETS(1000 MG) BY MOUTH TWICE DAILY WITH A MEAL, Disp: 360 tablet, Rfl: 1 .  STEGLATRO 5 MG TABS, TAKE 1 TABLET BY MOUTH DAILY FOR DIABETES, Disp: 30 tablet, Rfl: 5 .  Elastic Bandages & Supports (WRIST BRACE/RIGHT MEDIUM) MISC, For carpal tunnel syndrome; cock-up wrist brace; wear as much as possible, Disp: 1 each, Rfl: 0 .  glucose blood (BAYER CONTOUR TEST) test strip, Use as directed to check blood glucose twice daily; LON 99 months; Dx E11.65, Disp: 180 each, Rfl: 3 .  Insulin Pen Needle (NOVOFINE) 32G X 6 MM MISC, USE EVERYDAY WITH INSULIN, Disp: 100  each, Rfl: 0 .  MICROLET LANCETS MISC, TEST BLOOD SUGAR three times a day  Dx:E11.65 LON:99 months, Disp: 100 each, Rfl: 11 .  predniSONE (DELTASONE) 1 MG tablet, Take 1 mg by mouth daily., Disp: , Rfl:  .  predniSONE (DELTASONE) 5 MG tablet, Take 5 mg by mouth daily., Disp: , Rfl:  .  Zoster Vaccine Adjuvanted (SHINGRIX) injection, Shingrix (PF) 50 mcg/0.5 mL intramuscular suspension, kit, Disp: , Rfl:   No Known Allergies  ROS    No other specific complaints in a complete review of systems (except as listed in HPI above).  Objective  There were no vitals filed for this visit.   There is  no height or weight on file to calculate BMI.  Patient is alert, able to speak in full sentences without difficulty.   Assessment & Plan  1. Essential hypertension Asymptomatic, will get batterys for BP cuff and give [place] readings.   2. Uncontrolled type 2 diabetes mellitus with hyperglycemia (HCC) Likely controlled based of blood sugar readings, will check a1c at in office followup  - metFORMIN (GLUCOPHAGE) 500 MG tablet; TAKE 2 TABLETS(1000 MG) BY MOUTH TWICE DAILY WITH A MEAL  Dispense: 360 tablet; Refill: 1 - Ertugliflozin L-PyroglutamicAc (STEGLATRO) 5 MG TABS; Take 5 mg by mouth daily.  Dispense: 90 tablet; Refill: 1 - lovastatin (MEVACOR) 20 MG tablet; Take 1 tablet (20 mg total) by mouth at bedtime.  Dispense: 90 tablet; Refill: 1 - gabapentin (NEURONTIN) 400 MG capsule; TAKE 1 CAPSULE(400 MG) BY MOUTH AT BEDTIME  Dispense: 90 capsule; Refill: 1  3. Mixed hyperlipidemia - lovastatin (MEVACOR) 20 MG tablet; Take 1 tablet (20 mg total) by mouth at bedtime.  Dispense: 90 tablet; Refill: 1  4. Polymyalgia rheumatica syndrome (HCC) Controlled follow-up with rheum   5. Type 2 diabetes mellitus without complication, without long-term current use of insulin (HCC) - metFORMIN (GLUCOPHAGE) 500 MG tablet; TAKE 2 TABLETS(1000 MG) BY MOUTH TWICE DAILY WITH A MEAL  Dispense: 360 tablet; Refill:  1  6. Pure hypercholesterolemia - lovastatin (MEVACOR) 20 MG tablet; Take 1 tablet (20 mg total) by mouth at bedtime.  Dispense: 90 tablet; Refill: 1  7. GERD without esophagitis - famotidine (PEPCID) 20 MG tablet; TAKE 1 TABLET BY MOUTH DAILY AS NEEDED FOR HEARTBURN OR INDEGESTION  Dispense: 90 tablet; Refill: 3  I discussed the assessment and treatment plan with the patient. The patient was provided an opportunity to ask questions and all were answered. The patient agreed with the plan and demonstrated an understanding of the instructions.   The patient was advised to call back or seek an in-person evaluation if the symptoms worsen or if the condition fails to improve as anticipated.  I provided 23 minutes of non-face-to-face time during this encounter 15 minutes on phone additional time chart review.   Fredderick Severance, NP

## 2019-07-12 NOTE — Assessment & Plan Note (Signed)
Quit march 2020

## 2019-09-09 ENCOUNTER — Other Ambulatory Visit: Payer: Self-pay

## 2019-09-09 DIAGNOSIS — E1165 Type 2 diabetes mellitus with hyperglycemia: Secondary | ICD-10-CM

## 2019-09-09 MED ORDER — STEGLATRO 5 MG PO TABS: 5.0000 mg | ORAL_TABLET | Freq: Every day | ORAL | 1 refills | Status: DC

## 2019-10-18 ENCOUNTER — Other Ambulatory Visit: Payer: Self-pay

## 2019-10-18 DIAGNOSIS — E119 Type 2 diabetes mellitus without complications: Secondary | ICD-10-CM

## 2019-10-20 MED ORDER — NOVOFINE 32G X 6 MM MISC: 0 refills | Status: DC

## 2019-11-08 ENCOUNTER — Encounter: Payer: Self-pay | Admitting: Family Medicine

## 2019-11-08 ENCOUNTER — Other Ambulatory Visit: Payer: Self-pay

## 2019-11-08 ENCOUNTER — Ambulatory Visit: Payer: BC Managed Care – PPO | Admitting: Family Medicine

## 2019-11-08 VITALS — BP 110/60 | HR 67 | Temp 97.3°F | Resp 16 | Ht 66.0 in | Wt 141.3 lb

## 2019-11-08 DIAGNOSIS — E1142 Type 2 diabetes mellitus with diabetic polyneuropathy: Secondary | ICD-10-CM | POA: Diagnosis not present

## 2019-11-08 DIAGNOSIS — Z8781 Personal history of (healed) traumatic fracture: Secondary | ICD-10-CM

## 2019-11-08 DIAGNOSIS — E785 Hyperlipidemia, unspecified: Secondary | ICD-10-CM

## 2019-11-08 DIAGNOSIS — I152 Hypertension secondary to endocrine disorders: Secondary | ICD-10-CM

## 2019-11-08 DIAGNOSIS — I10 Essential (primary) hypertension: Secondary | ICD-10-CM

## 2019-11-08 DIAGNOSIS — K219 Gastro-esophageal reflux disease without esophagitis: Secondary | ICD-10-CM

## 2019-11-08 DIAGNOSIS — E1159 Type 2 diabetes mellitus with other circulatory complications: Secondary | ICD-10-CM

## 2019-11-08 DIAGNOSIS — M353 Polymyalgia rheumatica: Secondary | ICD-10-CM | POA: Diagnosis not present

## 2019-11-08 LAB — POCT GLYCOSYLATED HEMOGLOBIN (HGB A1C): Hemoglobin A1C: 6.5 % — AB (ref 4.0–5.6)

## 2019-11-08 MED ORDER — LEVEMIR FLEXTOUCH 100 UNIT/ML ~~LOC~~ SOPN: 8.0000 [IU] | PEN_INJECTOR | Freq: Every day | SUBCUTANEOUS | 2 refills | Status: DC

## 2019-11-08 MED ORDER — GABAPENTIN 400 MG PO CAPS: ORAL_CAPSULE | ORAL | 1 refills | Status: DC

## 2019-11-08 MED ORDER — LOVASTATIN 20 MG PO TABS: 20.0000 mg | ORAL_TABLET | Freq: Every day | ORAL | 1 refills | Status: DC

## 2019-11-08 MED ORDER — NOVOFINE 32G X 6 MM MISC: 0 refills | Status: DC

## 2019-11-08 MED ORDER — METFORMIN HCL 500 MG PO TABS: 500.0000 mg | ORAL_TABLET | Freq: Two times a day (BID) | ORAL | 1 refills | Status: DC

## 2019-11-08 MED ORDER — LISINOPRIL 10 MG PO TABS: 10.0000 mg | ORAL_TABLET | Freq: Every day | ORAL | 1 refills | Status: DC

## 2019-11-08 NOTE — Progress Notes (Signed)
Name: John Howard   MRN: [id number]    DOB: [birth year]   Date:11/08/2019       Progress Note  Subjective  Chief Complaint  Chief Complaint  Patient presents with  . Diabetes  . Hypertension  . Hyperlipidemia    HPI  Diabetes Mellitus Patient is rx metformin 1500 mg daily, steglatro  daily, 10 units of levemir at night, he states no recent episodes of hypoglycemia, his A1C is down to  6.5%, advised to go down on Levemir to 8 units daily . Denies polyphagia,  polydipsia or polyuria . Has neuropathy in bilateral legs is taking 1 capsule at night time- works well. He is on ACE  and statin.   Hyperlipidemia Patient rx lovastatin  nightly. Takes medications as prescribed with no missed doses a month. No myalgia  Denies myalgias Recent Labs       Lab Results  Component Value Date   CHOL 122 03/14/2019   HDL 51 03/14/2019   LDLCALC 54 03/14/2019   TRIG 88 03/14/2019   CHOLHDL 2.4 03/14/2019     Hypertension Patient is taking medication as prescribed, denies chest pain, palpitation or sob   Polymyagia rheumatica He is is off prednisone with good control of pain He sees rheumatology- Dr. Ruthine Dose, last visit was 08/2019, states now on yearly follow ups  GERD  Taking Pepcid every morning, controls reflux well. No heartburn or indigestion    History of compression fracture:   Incidental finding on CXR done 11/2018 , he has a bone density that was normal, he denies any  trauma, advised to start medication but he would like to hold off for now  Patient Active Problem List   Diagnosis Date Noted  . Thoracic compression fracture (HCC) 12/05/2018  . Long-term insulin use (HCC) 09/10/2018  . Polymyalgia rheumatica syndrome (HCC) 07/25/2018  . Elevated C-reactive protein (CRP) 07/20/2018  . Carpal tunnel syndrome of right wrist 06/26/2018  . Tobacco use 02/28/2018  . Osteoarthritis of right hand 02/28/2018  . Chronic right shoulder pain 11/23/2016  .  Vitamin D insufficiency 05/27/2016  . Special screening for malignant neoplasms, colon   . Second degree hemorrhoids   . Hyperlipidemia 06/23/2015  . Essential hypertension 06/23/2015  . Blind left eye 06/23/2015    Past Surgical History:  Procedure Laterality Date  . COLONOSCOPY    . COLONOSCOPY WITH PROPOFOL N/A 03/21/2016   Procedure: COLONOSCOPY WITH PROPOFOL;  Surgeon: Midge Minium, MD;  [facility];  Service: Endoscopy;  Laterality: N/A;  Diabetic - insulin  . EYE SURGERY      Family History  Problem Relation Age of Onset  . Hypertension Mother   . Diabetes Mother     Social History   Socioeconomic History  . Marital status: Married    Spouse name: Midwife  . Number of children: 0  . Years of education: Not on file  . Highest education level: 12th grade  Occupational History  . 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 Smoking status: Former Smoker    Packs/day: 0.25    Years: 35.00    Pack years: 8.75    Types: Cigarettes    Quit date: 02/07/2019    Years since quitting: 0.7  . Smokeless tobacco: Never Used  Substance and Sexual Activity  . Alcohol use: Not Currently    Alcohol/week: 1.0 standard drinks    Types: 1 Cans of beer per week    Comment: pt is trying to quit, has  not had a drink in a month  . Drug use: No  . Sexual activity: Yes    Partners: Female    Birth control/protection: None  Other Topics Concern  . Not on file  Social History Narrative  . Not on file   Social Determinants of Health   Financial Resource Strain: Low Risk   . Difficulty of Paying Living Expenses: Not hard at all  Food Insecurity: No Food Insecurity  . Worried About Programme researcher, broadcasting/film/videounning Out of Food in the Last Year: Never true  . Ran Out of Food in the Last Year: Never true  Transportation Needs: No Transportation Needs  . Lack of Transportation (Medical): No  . Lack of Transportation (Non-Medical): No  Physical Activity: Inactive  . Days of Exercise  per Week: 0 days  . Minutes of Exercise per Session: 0 min  Stress: No Stress Concern Present  . Feeling of Stress : Not at all  Social Connections: Slightly Isolated  . Frequency of Communication with Friends and Family: More than three times a week  . Frequency of Social Gatherings with Friends and Family: More than three times a week  . Attends Religious Services: More than 4 times per year  . Active Member of Clubs or Organizations: No  . Attends BankerClub or Organization Meetings: Never  . Marital Status: Married  Catering managerntimate Partner Violence: Not At Risk  . Fear of Current or Ex-Partner: No  . Emotionally Abused: No  . Physically Abused: No  . Sexually Abused: No     Current Outpatient Medications:  .  aspirin 81 MG tablet, Take 81 mg by mouth daily., Disp: , Rfl:  .  Cholecalciferol (VITAMIN D PO), Take 25 mg by mouth., Disp: , Rfl:  .  Ertugliflozin L-PyroglutamicAc (STEGLATRO) 5 MG TABS, Take 5 mg by mouth daily., Disp: 90 tablet, Rfl: 1 .  famotidine (PEPCID) 20 MG tablet, TAKE 1 TABLET BY MOUTH DAILY AS NEEDED FOR HEARTBURN OR INDEGESTION, Disp: 90 tablet, Rfl: 3 .  gabapentin (NEURONTIN) 400 MG capsule, TAKE 1 CAPSULE(400 MG) BY MOUTH AT BEDTIME, Disp: 90 capsule, Rfl: 1 .  glucose blood (BAYER CONTOUR TEST) test strip, Use as directed to check blood glucose twice daily; LON 99 months; Dx E11.65, Disp: 180 each, Rfl: 3 .  Insulin Pen Needle (NOVOFINE) 32G X 6 MM MISC, USE EVERYDAY WITH INSULIN, Disp: 100 each, Rfl: 0 .  LEVEMIR FLEXTOUCH 100 UNIT/ML Pen, Inject 10 Units into the skin daily at 10 pm., Disp: 15 mL, Rfl: 5 .  lisinopril (ZESTRIL) 10 MG tablet, TAKE 1 TABLET BY MOUTH EVERY DAY( LOWER DOSE), Disp: 90 tablet, Rfl: 1 .  lovastatin (MEVACOR) 20 MG tablet, Take 1 tablet (20 mg total) by mouth at bedtime., Disp: 90 tablet, Rfl: 1 .  metFORMIN (GLUCOPHAGE) 500 MG tablet, TAKE 2 TABLETS(1000 MG) BY MOUTH TWICE DAILY WITH A MEAL, Disp: 360 tablet, Rfl: 1 .  MICROLET LANCETS  MISC, TEST BLOOD SUGAR three times a day  Dx:E11.65 LON:99 months, Disp: 100 each, Rfl: 11  No Known Allergies  I personally reviewed active problem list, medication list, allergies, family history, social history, health maintenance with the patient/caregiver today.   ROS  Constitutional: Negative for fever or weight change.  Respiratory: Negative for cough and shortness of breath.   Cardiovascular: Negative for chest pain or palpitations.  Gastrointestinal: Negative for abdominal pain, no bowel changes.  Musculoskeletal: Negative for gait problem or joint swelling.  Skin: Negative for rash.  Neurological: Negative for  dizziness or headache.  No other specific complaints in a complete review of systems (except as listed in HPI above).  Objective  Vitals:   11/08/19 1041  BP: 110/60  Pulse: 67  Resp: 16  Temp: (!) 97.3 F (36.3 C)  TempSrc: Temporal  SpO2: 96%  Weight: 141 lb 4.8 oz (64.1 kg)  Height: 5\' 6"  (1.676 m)    Body mass index is 22.81 kg/m.  Physical Exam  Constitutional: Patient appears well-developed and well-nourished. No distress.  HEENT: head atraumatic, normocephalic, blind left eye from trauma Cardiovascular: Normal rate, regular rhythm and normal heart sounds.  No murmur heard. No BLE edema. Pulmonary/Chest: Effort normal and breath sounds normal. No respiratory distress. Abdominal: Soft.  There is no tenderness. Psychiatric: Patient has a normal mood and affect. behavior is normal. Judgment and thought content normal.  Recent Results (from the past 2160 hour(s))  POCT HgB A1C     Status: Abnormal   Collection Time: 11/08/19 10:44 AM  Result Value Ref Range   Hemoglobin A1C 6.5 (A) 4.0 - 5.6 %   HbA1c POC (<> result, manual entry)     HbA1c, POC (prediabetic range)     HbA1c, POC (controlled diabetic range)        PHQ2/9: Depression screen [facility] 2/9 11/08/2019 07/12/2019 01/07/2019 12/05/2018 08/31/2018  Decreased Interest 0 0 0 0 0  Down,  Depressed, Hopeless 0 0 0 0 0  PHQ - 2 Score 0 0 0 0 0  Altered sleeping 0 0 0 0 0  Tired, decreased energy 0 0 0 0 0  Change in appetite 0 0 0 0 0  Feeling bad or failure about yourself  0 0 0 0 0  Trouble concentrating 0 0 0 0 0  Moving slowly or fidgety/restless 0 0 0 0 0  Suicidal thoughts 0 0 0 0 0  PHQ-9 Score 0 0 0 0 0  Difficult doing work/chores - Not difficult at all Not difficult at all Not difficult at all -  Some recent data might be hidden    phq 9 is negative   Fall Risk: Fall Risk  11/08/2019 07/12/2019 01/07/2019 12/05/2018 08/31/2018  Falls in the past year? 0 0 0 0 No  Number falls in past yr: 0 0 0 0 -  Injury with Fall? 0 0 0 0 -     Functional Status Survey: Is the patient deaf or have difficulty hearing?: No Does the patient have difficulty seeing, even when wearing glasses/contacts?: No Does the patient have difficulty concentrating, remembering, or making decisions?: No Does the patient have difficulty walking or climbing stairs?: No Does the patient have difficulty dressing or bathing?: No Does the patient have difficulty doing errands alone such as visiting a doctor's office or shopping?: No    Assessment & Plan  1. Type 2 diabetes mellitus with polyneuropathy (HCC)  - POCT HgB A1C - metFORMIN (GLUCOPHAGE) 500 MG tablet; Take 1-2 tablets (500-1,000 mg total) by mouth 2 (two) times daily with a meal.  Dispense: 270 tablet; Refill: 1 - lovastatin (MEVACOR) 20 MG tablet; Take 1 tablet (20 mg total) by mouth at bedtime.  Dispense: 90 tablet; Refill: 1 - LEVEMIR FLEXTOUCH 100 UNIT/ML Pen; Inject 8 Units into the skin daily at 10 pm.  Dispense: 15 mL; Refill: 2 - Insulin Pen Needle (NOVOFINE) 32G X 6 MM MISC; USE EVERYDAY WITH INSULIN  Dispense: 100 each; Refill: 0 - gabapentin (NEURONTIN) 400 MG capsule; TAKE 1 CAPSULE(400 MG) BY MOUTH AT  BEDTIME  Dispense: 90 capsule; Refill: 1  2. Polymyalgia rheumatica syndrome ([place])  Doing well at this time  3.  Essential hypertension  - lisinopril (ZESTRIL) 10 MG tablet; Take 1 tablet (10 mg total) by mouth daily.  Dispense: 90 tablet; Refill: 1  4. GERD without esophagitis  Controlled   5. Dyslipidemia  - lovastatin (MEVACOR) 20 MG tablet; Take 1 tablet (20 mg total) by mouth at bedtime.  Dispense: 90 tablet; Refill: 1  6. Hypertension associated with diabetes ([place])   7. History of vertebral compression fracture

## 2020-01-01 ENCOUNTER — Encounter: Payer: Self-pay | Admitting: Family Medicine

## 2020-01-01 ENCOUNTER — Other Ambulatory Visit: Payer: Self-pay

## 2020-01-01 ENCOUNTER — Ambulatory Visit (INDEPENDENT_AMBULATORY_CARE_PROVIDER_SITE_OTHER): Payer: BC Managed Care – PPO | Admitting: Family Medicine

## 2020-01-01 VITALS — BP 132/76 | HR 67 | Temp 97.1°F | Resp 16 | Ht 64.0 in | Wt 137.1 lb

## 2020-01-01 DIAGNOSIS — Z0000 Encounter for general adult medical examination without abnormal findings: Secondary | ICD-10-CM

## 2020-01-01 DIAGNOSIS — R972 Elevated prostate specific antigen [PSA]: Secondary | ICD-10-CM

## 2020-01-01 NOTE — Patient Instructions (Signed)
Preventive Care 41-64 Years Old, Male Preventive care refers to lifestyle choices and visits with your health care provider that can promote health and wellness. This includes:  A yearly physical exam. This is also called an annual well check.  Regular dental and eye exams.  Immunizations.  Screening for certain conditions.  Healthy lifestyle choices, such as eating a healthy diet, getting regular exercise, not using drugs or products that contain nicotine and tobacco, and limiting alcohol use. What can I expect for my preventive care visit? Physical exam Your health care provider will check:  Height and weight. These may be used to calculate body mass index (BMI), which is a measurement that tells if you are at a healthy weight.  Heart rate and blood pressure.  Your skin for abnormal spots. Counseling Your health care provider may ask you questions about:  Alcohol, tobacco, and drug use.  Emotional well-being.  Home and relationship well-being.  Sexual activity.  Eating habits.  Work and work Statistician. What immunizations do I need?  Influenza (flu) vaccine  This is recommended every year. Tetanus, diphtheria, and pertussis (Tdap) vaccine  You may need a Td booster every 10 years. Varicella (chickenpox) vaccine  You may need this vaccine if you have not already been vaccinated. Zoster (shingles) vaccine  You may need this after age 64. Measles, mumps, and rubella (MMR) vaccine  You may need at least one dose of MMR if you were born in 1957 or later. You may also need a second dose. Pneumococcal conjugate (PCV13) vaccine  You may need this if you have certain conditions and were not previously vaccinated. Pneumococcal polysaccharide (PPSV23) vaccine  You may need one or two doses if you smoke cigarettes or if you have certain conditions. Meningococcal conjugate (MenACWY) vaccine  You may need this if you have certain conditions. Hepatitis A  vaccine  You may need this if you have certain conditions or if you travel or work in places where you may be exposed to hepatitis A. Hepatitis B vaccine  You may need this if you have certain conditions or if you travel or work in places where you may be exposed to hepatitis B. Haemophilus influenzae type b (Hib) vaccine  You may need this if you have certain risk factors. Human papillomavirus (HPV) vaccine  If recommended by your health care provider, you may need three doses over 6 months. You may receive vaccines as individual doses or as more than one vaccine together in one shot (combination vaccines). Talk with your health care provider about the risks and benefits of combination vaccines. What tests do I need? Blood tests  Lipid and cholesterol levels. These may be checked every 5 years, or more frequently if you are over 60 years old.  Hepatitis C test.  Hepatitis B test. Screening  Lung cancer screening. You may have this screening every year starting at age 43 if you have a 30-pack-year history of smoking and currently smoke or have quit within the past 15 years.  Prostate cancer screening. Recommendations will vary depending on your family history and other risks.  Colorectal cancer screening. All adults should have this screening starting at age 72 and continuing until age 2. Your health care provider may recommend screening at age 14 if you are at increased risk. You will have tests every 1-10 years, depending on your results and the type of screening test.  Diabetes screening. This is done by checking your blood sugar (glucose) after you have not eaten  for a while (fasting). You may have this done every 1-3 years.  Sexually transmitted disease (STD) testing. Follow these instructions at home: Eating and drinking  Eat a diet that includes fresh fruits and vegetables, whole grains, lean protein, and low-fat dairy products.  Take vitamin and mineral supplements as  recommended by your health care provider.  Do not drink alcohol if your health care provider tells you not to drink.  If you drink alcohol: ? Limit how much you have to 0-2 drinks a day. ? Be aware of how much alcohol is in your drink. In the [place] one drink equals one 12 oz bottle of beer (355 mL), one 5 oz glass of wine (148 mL), or one 1 oz glass of hard liquor (44 mL). Lifestyle  Take daily care of your teeth and gums.  Stay active. Exercise for at least 30 minutes on 5 or more days each week.  Do not use any products that contain nicotine or tobacco, such as cigarettes, e-cigarettes, and chewing tobacco. If you need help quitting, ask your health care provider.  If you are sexually active, practice safe sex. Use a condom or other form of protection to prevent STIs (sexually transmitted infections).  Talk with your health care provider about taking a low-dose aspirin every day starting at age 53. What's next?  Go to your health care provider once a year for a well check visit.  Ask your health care provider how often you should have your eyes and teeth checked.  Stay up to date on all vaccines. This information is not intended to replace advice given to you by your health care provider. Make sure you discuss any questions you have with your health care provider. Document Revised: 11/08/2018 Document Reviewed: 11/08/2018 Elsevier Patient Education  2020 [employer].

## 2020-01-01 NOTE — Progress Notes (Signed)
Name: John Howard   MRN: [id number]    DOB: 10/23/1956   Date:01/01/2020       Progress Note  Subjective  Chief Complaint  Chief Complaint  Patient presents with  . Annual Exam    HPI  Patient presents for annual CPE   USPSTF grade A and B recommendations:  Diet: occasionally eats, mostly eating at home Exercise: active job   Depression: phq 9 is negative [facility] screen [facility] 2[facility] 01/01/2020 01/01/2020 11/08/2019 07/12/2019 01/07/2019  Decreased Interest 0 0 0 0 0  Down, Depressed, Hopeless 0 0 0 0 0  PHQ - 2 Score 0 0 0 0 0  Altered sleeping 0 0 0 0 0  Tired, decreased energy 0 0 0 0 0  Change in appetite 0 0 0 0 0  Feeling bad or failure about yourself  0 0 0 0 0  Trouble concentrating 0 0 0 0 0  Moving slowly or fidgety/restless 0 0 0 0 0  Suicidal thoughts 0 0 0 0 0  PHQ-9 Score 0 0 0 0 0  Difficult doing work/chores - - - Not difficult at all Not difficult at all  Some recent data might be hidden    Hypertension:  BP Readings from Last 3 Encounters:  01/01/20 132/76  11/08/19 110/60  01/07/19 (!) 96/58    Obesity: Wt Readings from Last 3 Encounters:  01/01/20 137 lb 1.6 oz (62.2 kg)  11/08/19 141 lb 4.8 oz (64.1 kg)  01/07/19 137 lb 4.8 oz (62.3 kg)   BMI Readings from Last 3 Encounters:  01/01/20 23.53 kg/m  11/08/19 22.81 kg/m  01/07/19 22.16 kg/m     Lipids:  Lab Results  Component Value Date   CHOL 122 03/14/2019   CHOL 133 08/31/2018   CHOL 129 02/28/2018   Lab Results  Component Value Date   HDL 51 03/14/2019   HDL 66 08/31/2018   HDL 51 02/28/2018   Lab Results  Component Value Date   LDLCALC 54 03/14/2019   LDLCALC 56 08/31/2018   LDLCALC 65 02/28/2018   Lab Results  Component Value Date   TRIG 88 03/14/2019   TRIG 40 08/31/2018   TRIG 52 02/28/2018   Lab Results  Component Value Date   CHOLHDL 2.4 03/14/2019   CHOLHDL 2.0 08/31/2018   CHOLHDL 2.5 02/28/2018   No results found for: LDLDIRECT Glucose:  Glucose, Bld   Date Value Ref Range Status  03/14/2019 192 (H) 65 - 99 mg/dL Final    Comment:    .            Fasting reference interval . For someone without known diabetes, a glucose value >125 mg/dL indicates that they may have diabetes and this should be confirmed with a follow-up test. .   12/05/2018 100 65 - 139 mg/dL Final    Comment:    .        Non-fasting reference interval .   08/31/2018 266 (H) 65 - 139 mg/dL Final    Comment:    .        Non-fasting reference interval .    Glucose-Capillary  Date Value Ref Range Status  03/21/2016 156 (H) 65 - 99 mg/dL Final  40/98/119104/24/2017 478169 (H) 65 - 99 mg/dL Final      [facility]  AUDIT-C Score  0      Married STD testing and prevention (HIV/chl/gon/syphilis): not done  Hep C: 2020  Skin cancer: Discussed monitoring for atypical lesions Colorectal cancer: repeat in 2027  Prostate cancer: last level was trending up we will recheck today  Lab Results  Component Value Date   PSA 1.8 01/07/2019   PSA 1.3 03/27/2017    IPSS Questionnaire (AUA-7): Over the past month.   1)  How often have you had a sensation of not emptying your bladder completely after you finish urinating?  0 - Not at all  2)  How often have you had to urinate again less than two hours after you finished urinating? 0 - Not at all  3)  How often have you found you stopped and started again several times when you urinated?  0 - Not at all  4) How difficult have you found it to postpone urination?  0 - Not at all  5) How often have you had a weak urinary stream?  0 - Not at all  6) How often have you had to push or strain to begin urination?  0 - Not at all  7) How many times did you most typically get up to urinate from the time you went to bed until the time you got up in the morning?  1 - 1 time  Total score:  0-7 mildly symptomatic   8-19 moderately symptomatic   20-35 severely symptomatic    Lung  cancer: Low Dose CT Chest recommended if Age 23-80 years, 30 pack-year currently smoking OR have quit w/in 15years. Patient does qualify.  He would like to think about it  AAA:The USPSTF recommends one-time screening with ultrasonography in men ages 61 to 60 years who have ever smoked ECG:  2017   Advanced Care Planning: A voluntary discussion about advance care planning including the explanation and discussion of advance directives.  Discussed health care proxy and Living will, and the patient was able to identify a health care proxy as wife .  Patient does not have a living will at present time.  Patient Active Problem List   Diagnosis Date Noted  . Thoracic compression fracture (HCC) 12/05/2018  . Long-term insulin use (HCC) 09/10/2018  . Polymyalgia rheumatica syndrome (HCC) 07/25/2018  . Long term current use of systemic steroids 07/25/2018  . Elevated C-reactive protein (CRP) 07/20/2018  . Carpal tunnel syndrome of right wrist 06/26/2018  . Tobacco use 02/28/2018  . Osteoarthritis of right hand 02/28/2018  . Chronic right shoulder pain 11/23/2016  . Vitamin D insufficiency 05/27/2016  . Second degree hemorrhoids   . Hyperlipidemia 06/23/2015  . Essential hypertension 06/23/2015  . Blind left eye 06/23/2015    Past Surgical History:  Procedure Laterality Date  . COLONOSCOPY    . COLONOSCOPY WITH PROPOFOL N/A 03/21/2016   Procedure: COLONOSCOPY WITH PROPOFOL;  Surgeon: Midge Minium, MD;  [facility];  Service: Endoscopy;  Laterality: N/A;  Diabetic - insulin  . EYE SURGERY      Family History  Problem Relation Age of Onset  . Hypertension Mother   . Diabetes Mother   . Diabetes Maternal Grandmother   . Heart attack Maternal Grandfather     Social History   Socioeconomic History  . Marital status: Married    Spouse name: John Howard  . Number of children: 0  . Years of education: Not on file  . Highest education level: 12th grade  Occupational History   . 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 Smoking status: Former Smoker    Packs/day: 0.25  Years: 44.00    Pack years: 11.00    Types: Cigarettes    Start date: 11/28/1974    Quit date: 02/07/2019    Years since quitting: 0.8  . Smokeless tobacco: Never Used  Substance and Sexual Activity  . Alcohol use: Not Currently    Alcohol/week: 1.0 standard drinks    Types: 1 Cans of beer per week    Comment: pt is trying to quit, has not had a drink in a month  . Drug use: No  . Sexual activity: Yes    Partners: Female    Birth control/protection: None  Other Topics Concern  . Not on file  Social History Narrative   Married past 10 years, wife has grown children    Social Determinants of Health   Financial Resource Strain: Low Risk   . Difficulty of Paying Living Expenses: Not hard at all  Food Insecurity: No Food Insecurity  . Worried About Programme researcher, broadcasting/film/video in the Last Year: Never true  . Ran Out of Food in the Last Year: Never true  Transportation Needs: No Transportation Needs  . Lack of Transportation (Medical): No  . Lack of Transportation (Non-Medical): No  Physical Activity: Sufficiently Active  . Days of Exercise per Week: 7 days  . Minutes of Exercise per Session: 60 min  Stress: No Stress Concern Present  . Feeling of Stress : Only a little  Social Connections: Moderately Isolated  . Frequency of Communication with Friends and Family: Never  . Frequency of Social Gatherings with Friends and Family: Never  . Attends Religious Services: Never  . Active Member of Clubs or Organizations: No  . Attends Banker Meetings: Never  . Marital Status: Married  Catering manager Violence: Not At Risk  . Fear of Current or Ex-Partner: No  . Emotionally Abused: No  . Physically Abused: No  . Sexually Abused: No     Current Outpatient Medications:  .  aspirin 81 MG tablet, Take 81 mg by mouth daily., Disp: , Rfl:  .  Cholecalciferol (VITAMIN D PO),  Take 25 mg by mouth., Disp: , Rfl:  .  Ertugliflozin L-PyroglutamicAc (STEGLATRO) 5 MG TABS, Take 5 mg by mouth daily., Disp: 90 tablet, Rfl: 1 .  famotidine (PEPCID) 20 MG tablet, TAKE 1 TABLET BY MOUTH DAILY AS NEEDED FOR HEARTBURN OR INDEGESTION, Disp: 90 tablet, Rfl: 3 .  gabapentin (NEURONTIN) 400 MG capsule, TAKE 1 CAPSULE(400 MG) BY MOUTH AT BEDTIME, Disp: 90 capsule, Rfl: 1 .  glucose blood (BAYER CONTOUR TEST) test strip, Use as directed to check blood glucose twice daily; LON 99 months; Dx E11.65, Disp: 180 each, Rfl: 3 .  Insulin Pen Needle (NOVOFINE) 32G X 6 MM MISC, USE EVERYDAY WITH INSULIN, Disp: 100 each, Rfl: 0 .  LEVEMIR FLEXTOUCH 100 UNIT/ML Pen, Inject 8 Units into the skin daily at 10 pm., Disp: 15 mL, Rfl: 2 .  lisinopril (ZESTRIL) 10 MG tablet, Take 1 tablet (10 mg total) by mouth daily., Disp: 90 tablet, Rfl: 1 .  lovastatin (MEVACOR) 20 MG tablet, Take 1 tablet (20 mg total) by mouth at bedtime., Disp: 90 tablet, Rfl: 1 .  metFORMIN (GLUCOPHAGE) 500 MG tablet, Take 1-2 tablets (500-1,000 mg total) by mouth 2 (two) times daily with a meal., Disp: 270 tablet, Rfl: 1 .  MICROLET LANCETS MISC, TEST BLOOD SUGAR three times a day  Dx:E11.65 LON:99 months, Disp: 100 each, Rfl: 11  No Known Allergies   ROS  Constitutional:  Negative for fever or weight change.  Respiratory: Negative for cough and shortness of breath.   Cardiovascular: Negative for chest pain or palpitations.  Gastrointestinal: Negative for abdominal pain, no bowel changes.  Musculoskeletal: Negative for gait problem , positive for joint swelling.  Skin: Negative for rash.  Neurological: Negative for dizziness or headache.  No other specific complaints in a complete review of systems (except as listed in HPI above).  Objective  Vitals:   01/01/20 0954  BP: 132/76  Pulse: 67  Resp: 16  Temp: (!) 97.1 F (36.2 C)  TempSrc: Temporal  SpO2: 98%  Weight: 137 lb 1.6 oz (62.2 kg)  Height: 5\' 4"  (1.626 m)     Body mass index is 23.53 kg/m.  Physical Exam  Constitutional: Patient appears well-developed and well-nourished. No distress.  HENT: Head: Normocephalic and atraumatic. Ears: B TMs ok, no erythema or effusion; Nose: Nose normal. Mouth/Throat: not done Eyes: Conjunctivae and EOM are normal. Pupils are equal, round, and reactive to light. No scleral icterus.  Neck: Normal range of motion. Neck supple. No JVD present. No thyromegaly present.  Cardiovascular: Normal rate, regular rhythm and normal heart sounds.  No murmur heard. No BLE edema. Pulmonary/Chest: Effort normal and breath sounds normal. No respiratory distress. Abdominal: Soft. Bowel sounds are normal, no distension. There is no tenderness. no masses MALE GENITALIA: Normal descended testes bilaterally, no masses palpated, no hernias, no lesions, no discharge  RECTAL: enlarged prostate,  no rectal masses or hemorrhoids Musculoskeletal: Normal range of motion, no joint effusions. Mild kyphosis, pain during abduction of right arm Neurological: he is alert and oriented to person, place, and time. No cranial nerve deficit. Coordination, balance, strength, speech and gait are normal.  Skin: Skin is warm and dry. No rash noted. No erythema.  Psychiatric: Patient has a normal mood and affect. behavior is normal. Judgment and thought content normal.  Recent Results (from the past 2160 hour(s))  POCT HgB A1C     Status: Abnormal   Collection Time: 11/08/19 10:44 AM  Result Value Ref Range   Hemoglobin A1C 6.5 (A) 4.0 - 5.6 %   HbA1c POC (<> result, manual entry)     HbA1c, POC (prediabetic range)     HbA1c, POC (controlled diabetic range)       PHQ2/9: Depression screen [facility] 2/9 01/01/2020 01/01/2020 11/08/2019 07/12/2019 01/07/2019  Decreased Interest 0 0 0 0 0  Down, Depressed, Hopeless 0 0 0 0 0  PHQ - 2 Score 0 0 0 0 0  Altered sleeping 0 0 0 0 0  Tired, decreased energy 0 0 0 0 0  Change in appetite 0 0 0 0 0  Feeling bad or  failure about yourself  0 0 0 0 0  Trouble concentrating 0 0 0 0 0  Moving slowly or fidgety/restless 0 0 0 0 0  Suicidal thoughts 0 0 0 0 0  PHQ-9 Score 0 0 0 0 0  Difficult doing work/chores - - - Not difficult at all Not difficult at all  Some recent data might be hidden     Fall Risk: Fall Risk  01/01/2020 11/08/2019 07/12/2019 01/07/2019 12/05/2018  Falls in the past year? 0 0 0 0 0  Number falls in past yr: 0 0 0 0 0  Injury with Fall? 0 0 0 0 0     Functional Status Survey: Is the patient deaf or have difficulty hearing?: No Does the patient have difficulty seeing, even when wearing glasses/contacts?: Yes  Does the patient have difficulty concentrating, remembering, or making decisions?: No Does the patient have difficulty walking or climbing stairs?: No Does the patient have difficulty dressing or bathing?: No Does the patient have difficulty doing errands alone such as visiting a doctor's office or shopping?: No    Assessment & Plan  1. Well adult exam  - COMPLETE METABOLIC PANEL WITH GFR - CBC with Differential/Platelet - Lipid panel - PSA  2. Elevated PSA  - PSA  -Prostate cancer screening and PSA options (with potential risks and benefits of testing vs not testing) were discussed along with recent recs/guidelines. -USPSTF grade A and B recommendations reviewed with patient; age-appropriate recommendations, preventive care, screening tests, etc discussed and encouraged; healthy living encouraged; see AVS for patient education given to patient -Discussed importance of 150 minutes of physical activity weekly, eat two servings of fish weekly, eat one serving of tree nuts ( cashews, pistachios, pecans, almonds.Marland Kitchen) every other day, eat 6 servings of fruit/vegetables daily and drink plenty of water and avoid sweet beverages.

## 2020-01-02 LAB — CBC WITH DIFFERENTIAL/PLATELET
Absolute Monocytes: 676 cells/uL (ref 200–950)
Basophils Absolute: 39 cells/uL (ref 0–200)
Basophils Relative: 0.8 %
Eosinophils Absolute: 284 cells/uL (ref 15–500)
Eosinophils Relative: 5.8 %
HCT: 44.3 % (ref 38.5–50.0)
Hemoglobin: 15.1 g/dL (ref 13.2–17.1)
Lymphs Abs: 1612 cells/uL (ref 850–3900)
MCH: 30.3 pg (ref 27.0–33.0)
MCHC: 34.1 g/dL (ref 32.0–36.0)
MCV: 89 fL (ref 80.0–100.0)
MPV: 10.5 fL (ref 7.5–12.5)
Monocytes Relative: 13.8 %
Neutro Abs: 2288 cells/uL (ref 1500–7800)
Neutrophils Relative %: 46.7 %
Platelets: 229 10*3/uL (ref 140–400)
RBC: 4.98 10*6/uL (ref 4.20–5.80)
RDW: 12.7 % (ref 11.0–15.0)
Total Lymphocyte: 32.9 %
WBC: 4.9 10*3/uL (ref 3.8–10.8)

## 2020-01-02 LAB — COMPLETE METABOLIC PANEL WITH GFR
AG Ratio: 1.8 (calc) (ref 1.0–2.5)
ALT: 14 U/L (ref 9–46)
AST: 15 U/L (ref 10–35)
Albumin: 4.6 g/dL (ref 3.6–5.1)
Alkaline phosphatase (APISO): 61 U/L (ref 35–144)
BUN: 15 mg/dL (ref 7–25)
CO2: 28 mmol/L (ref 20–32)
Calcium: 9.9 mg/dL (ref 8.6–10.3)
Chloride: 103 mmol/L (ref 98–110)
Creat: 1.03 mg/dL (ref 0.70–1.25)
GFR, Est African American: 89 mL/min/{1.73_m2} (ref >=60)
GFR, Est Non African American: 77 mL/min/{1.73_m2} (ref >=60)
Globulin: 2.6 g/dL (calc) (ref 1.9–3.7)
Glucose, Bld: 100 mg/dL — ABNORMAL HIGH (ref 65–99)
Potassium: 4.9 mmol/L (ref 3.5–5.3)
Sodium: 139 mmol/L (ref 135–146)
Total Bilirubin: 0.3 mg/dL (ref 0.2–1.2)
Total Protein: 7.2 g/dL (ref 6.1–8.1)

## 2020-01-02 LAB — LIPID PANEL
Cholesterol: 122 mg/dL (ref <200)
HDL: 50 mg/dL (ref >=40)
LDL Cholesterol (Calc): 59 mg/dL (calc)
Non-HDL Cholesterol (Calc): 72 mg/dL (calc) (ref <130)
Total CHOL/HDL Ratio: 2.4 (calc) (ref <5.0)
Triglycerides: 52 mg/dL (ref <150)

## 2020-01-02 LAB — PSA: PSA: 1.6 ng/mL (ref <=4.0)

## 2020-01-02 LAB — HM DIABETES EYE EXAM

## 2020-01-06 ENCOUNTER — Telehealth: Payer: Self-pay

## 2020-01-06 NOTE — Telephone Encounter (Signed)
Pt given lab results per notes of Dr Carlynn Purl on 12/05/19. Pt verbalized understanding.

## 2020-01-07 ENCOUNTER — Encounter: Payer: Self-pay | Admitting: Family Medicine

## 2020-01-10 ENCOUNTER — Emergency Department: Payer: BC Managed Care – PPO

## 2020-01-10 ENCOUNTER — Emergency Department
Admission: EM | Admit: 2020-01-10 | Discharge: 2020-01-10 | Disposition: A | Discharge status: Home / Self Care | Payer: BC Managed Care – PPO | Attending: Emergency Medicine | Admitting: Emergency Medicine

## 2020-01-10 ENCOUNTER — Other Ambulatory Visit: Payer: Self-pay

## 2020-01-10 ENCOUNTER — Encounter: Payer: Self-pay | Admitting: Emergency Medicine

## 2020-01-10 DIAGNOSIS — Z794 Long term (current) use of insulin: Secondary | ICD-10-CM | POA: Diagnosis not present

## 2020-01-10 DIAGNOSIS — Z79899 Other long term (current) drug therapy: Secondary | ICD-10-CM | POA: Diagnosis not present

## 2020-01-10 DIAGNOSIS — I10 Essential (primary) hypertension: Secondary | ICD-10-CM | POA: Diagnosis not present

## 2020-01-10 DIAGNOSIS — Z7982 Long term (current) use of aspirin: Secondary | ICD-10-CM | POA: Diagnosis not present

## 2020-01-10 DIAGNOSIS — Z87891 Personal history of nicotine dependence: Secondary | ICD-10-CM | POA: Diagnosis not present

## 2020-01-10 DIAGNOSIS — M79641 Pain in right hand: Secondary | ICD-10-CM | POA: Insufficient documentation

## 2020-01-10 DIAGNOSIS — E119 Type 2 diabetes mellitus without complications: Secondary | ICD-10-CM | POA: Insufficient documentation

## 2020-01-10 MED ORDER — MELOXICAM 15 MG PO TABS: 15.0000 mg | ORAL_TABLET | Freq: Every day | ORAL | 1 refills | Status: AC

## 2020-01-10 NOTE — Discharge Instructions (Signed)
You can take meloxicam once daily for pain and inflammation. Finger splint can be taken on and off with showering but please wear until following up with orthopedics. Please call phone number for Dr. Odis Luster office to follow-up with orthopedics.
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
## 2020-01-23 ENCOUNTER — Other Ambulatory Visit: Payer: Self-pay | Admitting: Family Medicine

## 2020-01-23 DIAGNOSIS — E1142 Type 2 diabetes mellitus with diabetic polyneuropathy: Secondary | ICD-10-CM

## 2020-01-23 DIAGNOSIS — E119 Type 2 diabetes mellitus without complications: Secondary | ICD-10-CM

## 2020-02-07 ENCOUNTER — Ambulatory Visit: Payer: BC Managed Care – PPO | Admitting: Family Medicine

## 2020-02-07 ENCOUNTER — Other Ambulatory Visit: Payer: Self-pay

## 2020-02-07 ENCOUNTER — Encounter: Payer: Self-pay | Admitting: Family Medicine

## 2020-02-07 VITALS — BP 122/70 | HR 80 | Temp 97.1°F | Resp 14 | Ht 64.0 in | Wt 144.5 lb

## 2020-02-07 DIAGNOSIS — K219 Gastro-esophageal reflux disease without esophagitis: Secondary | ICD-10-CM

## 2020-02-07 DIAGNOSIS — I152 Hypertension secondary to endocrine disorders: Secondary | ICD-10-CM

## 2020-02-07 DIAGNOSIS — I10 Essential (primary) hypertension: Secondary | ICD-10-CM | POA: Diagnosis not present

## 2020-02-07 DIAGNOSIS — E785 Hyperlipidemia, unspecified: Secondary | ICD-10-CM

## 2020-02-07 DIAGNOSIS — E1142 Type 2 diabetes mellitus with diabetic polyneuropathy: Secondary | ICD-10-CM | POA: Diagnosis not present

## 2020-02-07 DIAGNOSIS — E1159 Type 2 diabetes mellitus with other circulatory complications: Secondary | ICD-10-CM

## 2020-02-07 DIAGNOSIS — Z794 Long term (current) use of insulin: Secondary | ICD-10-CM

## 2020-02-07 LAB — POCT GLYCOSYLATED HEMOGLOBIN (HGB A1C): HbA1c, POC (controlled diabetic range): 6.9 % (ref 0.0–7.0)

## 2020-02-07 MED ORDER — LEVEMIR FLEXTOUCH 100 UNIT/ML ~~LOC~~ SOPN: 8.0000 [IU] | PEN_INJECTOR | Freq: Every day | SUBCUTANEOUS | 2 refills | Status: DC

## 2020-02-07 MED ORDER — STEGLATRO 5 MG PO TABS: 5.0000 mg | ORAL_TABLET | Freq: Every day | ORAL | 1 refills | Status: DC

## 2020-02-07 NOTE — Progress Notes (Signed)
Name: John Howard   MRN: [id number]    DOB: 12/16/1955   Date:02/07/2020       Progress Note  Subjective  Chief Complaint  Chief Complaint  Patient presents with  . Medication Refill  . Diabetes  . Hyperlipidemia  . Hypertension  . Gastroesophageal Reflux  . Polymyagia rheumatica    HPI    DMII: he is complaints with medication, he is down on 8 units of Levemir per night, Metfomin 1500 mg daily and Steglatro 5 mg and A1C today is at goal at 6.9%. He denies hypoglycemic episodes. No polyphagia, polydipsia or polyuria. He states he is snacking on chips or peanut butter crackers at night and when he eats chips his glucose is higher in the morning. FSBS at home has been 84-118 , usually close to 100. He has neuropathy on both legs , but pain is controlled with gabapentin 400 mg at night.   Hyperlipidemia: last LDL was 59, HDL was 50, he is taking statin therapy daily and denies side effects of medication  PMR: He used to see Dr. Louanna Raw , last visit 08/2019, doing well at this time, off prednisone.   GERD: no indigestion or heartburn, taking pepcid as prescribed, states he avoids spicy food  HTN at goal, taking medication, no chest pain or palpitation, no dizziness.   Vitamin D deficiency: taking otc supplementation   Patient Active Problem List   Diagnosis Date Noted  . Thoracic compression fracture (HCC) 12/05/2018  . Long-term insulin use (HCC) 09/10/2018  . Polymyalgia rheumatica syndrome (HCC) 07/25/2018  . Elevated C-reactive protein (CRP) 07/20/2018  . Carpal tunnel syndrome of right wrist 06/26/2018  . Tobacco use 02/28/2018  . Osteoarthritis of right hand 02/28/2018  . Chronic right shoulder pain 11/23/2016  . Vitamin D insufficiency 05/27/2016  . Second degree hemorrhoids   . Hyperlipidemia 06/23/2015  . Essential hypertension 06/23/2015  . Blind left eye 06/23/2015    Past Surgical History:  Procedure Laterality Date  . COLONOSCOPY    . COLONOSCOPY  WITH PROPOFOL N/A 03/21/2016   Procedure: COLONOSCOPY WITH PROPOFOL;  Surgeon: Midge Minium, MD;  [facility];  Service: Endoscopy;  Laterality: N/A;  Diabetic - insulin  . EYE SURGERY      Family History  Problem Relation Age of Onset  . Hypertension Mother   . Diabetes Mother   . Diabetes Maternal Grandmother   . Heart attack Maternal Grandfather     Social History   Tobacco Use  . Smoking status: Former Smoker    Packs/day: 0.25    Years: 44.00    Pack years: 11.00    Types: Cigarettes    Start date: 11/28/1974    Quit date: 02/07/2019    Years since quitting: 1.0  . Smokeless tobacco: Never Used  Substance Use Topics  . Alcohol use: Not Currently    Alcohol/week: 1.0 standard drinks    Types: 1 Cans of beer per week    Comment: pt is trying to quit, has not had a drink in a month     Current Outpatient Medications:  .  aspirin 81 MG tablet, Take 81 mg by mouth daily., Disp: , Rfl:  .  BD PEN NEEDLE MICRO U/F 32G X 6 MM MISC, USE EVERYDAY WITH INSULIN, Disp: 100 each, Rfl: 5 .  Cholecalciferol (VITAMIN D PO), Take 25 mg by mouth., Disp: , Rfl:  .  D 1000 25 MCG (1000 UT) capsule, , Disp: , Rfl:  .  Ertugliflozin L-PyroglutamicAc (STEGLATRO) 5 MG TABS, Take 5 mg by mouth daily., Disp: 90 tablet, Rfl: 1 .  famotidine (PEPCID) 20 MG tablet, TAKE 1 TABLET BY MOUTH DAILY AS NEEDED FOR HEARTBURN OR INDEGESTION, Disp: 90 tablet, Rfl: 3 .  gabapentin (NEURONTIN) 400 MG capsule, TAKE 1 CAPSULE(400 MG) BY MOUTH AT BEDTIME, Disp: 90 capsule, Rfl: 1 .  glucose blood (BAYER CONTOUR TEST) test strip, Use as directed to check blood glucose twice daily; LON 99 months; Dx E11.65, Disp: 180 each, Rfl: 3 .  glucose blood test strip, Contour Test Strips  USE UTD TO CHECK BLOOD GLUCOSE BID, Disp: , Rfl:  .  Insulin Pen Needle 31G X 8 MM MISC, BD Ultra-Fine Micro Pen Needle 32 gauge x 1/4"  USE EVERYDAY WITH INSULIN, Disp: , Rfl:  .  LEVEMIR FLEXTOUCH 100 UNIT/ML FlexPen, Inject  8 Units into the skin daily at 10 pm., Disp: 15 mL, Rfl: 2 .  lisinopril (ZESTRIL) 10 MG tablet, Take 1 tablet (10 mg total) by mouth daily., Disp: 90 tablet, Rfl: 1 .  lovastatin (MEVACOR) 20 MG tablet, Take 1 tablet (20 mg total) by mouth at bedtime., Disp: 90 tablet, Rfl: 1 .  metFORMIN (GLUCOPHAGE) 500 MG tablet, Take 1-2 tablets (500-1,000 mg total) by mouth 2 (two) times daily with a meal., Disp: 270 tablet, Rfl: 1 .  Microlet Lancets MISC, TEST BLOOD SUGAR THREE TIMES DAILY, Disp: 100 each, Rfl: 5  No Known Allergies  I personally reviewed active problem list, medication list, allergies, family history, social history with the patient/caregiver today.   ROS  Constitutional: Negative for fever or weight change.  Respiratory: Negative for cough and shortness of breath.   Cardiovascular: Negative for chest pain or palpitations.  Gastrointestinal: Negative for abdominal pain, no bowel changes.  Musculoskeletal: Negative for gait problem or joint swelling.  Skin: Negative for rash.  Neurological: Negative for dizziness or headache.  No other specific complaints in a complete review of systems (except as listed in HPI above).  Objective  Vitals:   02/07/20 0919  BP: 122/70  Pulse: 80  Resp: 14  Temp: (!) 97.1 F (36.2 C)  TempSrc: Temporal  SpO2: 97%  Weight: 144 lb 8 oz (65.5 kg)  Height: 5\' 4"  (1.626 m)    Body mass index is 24.8 kg/m.  Physical Exam  Constitutional: Patient appears well-developed and well-nourished. Obese  No distress.  HEENT: head atraumatic, normocephalic, strabismus of left eye, secondary to being legally blind  Cardiovascular: Normal rate, regular rhythm and normal heart sounds.  No murmur heard. No BLE edema. Pulmonary/Chest: Effort normal and breath sounds normal. No respiratory distress. Abdominal: Soft.  There is no tenderness. Psychiatric: Patient has a normal mood and affect. behavior is normal. Judgment and thought content  normal.  Recent Results (from the past 2160 hour(s))  COMPLETE METABOLIC PANEL WITH GFR     Status: Abnormal   Collection Time: 01/01/20 10:53 AM  Result Value Ref Range   Glucose, Bld 100 (H) 65 - 99 mg/dL    Comment: .            Fasting reference interval . For someone without known diabetes, a glucose value between 100 and 125 mg/dL is consistent with prediabetes and should be confirmed with a follow-up test. .    BUN 15 7 - 25 mg/dL   Creat 02/29/20 9.70 - 2.63 mg/dL    Comment: For patients >42 years of age, the reference limit for Creatinine is approximately 13%  higher for people identified as African-American. .    GFR, Est Non African American 77 > OR = 60 mL/min/1.74m2   GFR, Est African American 89 > OR = 60 mL/min/1.30m2   BUN/Creatinine Ratio NOT APPLICABLE 6 - 22 (calc)   Sodium 139 135 - 146 mmol/L   Potassium 4.9 3.5 - 5.3 mmol/L   Chloride 103 98 - 110 mmol/L   CO2 28 20 - 32 mmol/L   Calcium 9.9 8.6 - 10.3 mg/dL   Total Protein 7.2 6.1 - 8.1 g/dL   Albumin 4.6 3.6 - 5.1 g/dL   Globulin 2.6 1.9 - 3.7 g/dL (calc)   AG Ratio 1.8 1.0 - 2.5 (calc)   Total Bilirubin 0.3 0.2 - 1.2 mg/dL   Alkaline phosphatase (APISO) 61 35 - 144 U/L   AST 15 10 - 35 U/L   ALT 14 9 - 46 U/L  CBC with Differential/Platelet     Status: None   Collection Time: 01/01/20 10:53 AM  Result Value Ref Range   WBC 4.9 3.8 - 10.8 Thousand/uL   RBC 4.98 4.20 - 5.80 Million/uL   Hemoglobin 15.1 13.2 - 17.1 g/dL   HCT 44.3 38.5 - 50.0 %   MCV 89.0 80.0 - 100.0 fL   MCH 30.3 27.0 - 33.0 pg   MCHC 34.1 32.0 - 36.0 g/dL   RDW 12.7 11.0 - 15.0 %   Platelets 229 140 - 400 Thousand/uL   MPV 10.5 7.5 - 12.5 fL   Neutro Abs 2,288 1,500 - 7,800 cells/uL   Lymphs Abs 1,612 850 - 3,900 cells/uL   Absolute Monocytes 676 200 - 950 cells/uL   Eosinophils Absolute 284 15 - 500 cells/uL   Basophils Absolute 39 0 - 200 cells/uL   Neutrophils Relative % 46.7 %   Total Lymphocyte 32.9 %   Monocytes  Relative 13.8 %   Eosinophils Relative 5.8 %   Basophils Relative 0.8 %  Lipid panel     Status: None   Collection Time: 01/01/20 10:53 AM  Result Value Ref Range   Cholesterol 122 <200 mg/dL   HDL 50 > OR = 40 mg/dL   Triglycerides 52 <150 mg/dL   LDL Cholesterol (Calc) 59 mg/dL (calc)    Comment: Reference range: <100 . Desirable range <100 mg/dL for primary prevention;   <70 mg/dL for patients with CHD or diabetic patients  with > or = 2 CHD risk factors. Marland Kitchen LDL-C is now calculated using the Martin-Hopkins  calculation, which is a validated novel method providing  better accuracy than the Friedewald equation in the  estimation of LDL-C.  Cresenciano Genre et al. Annamaria Helling. 5732;202(54): 2061-2068  (http://education.[employer].com/faq/FAQ164)    Total CHOL/HDL Ratio 2.4 <5.0 (calc)   Non-HDL Cholesterol (Calc) 72 <130 mg/dL (calc)    Comment: For patients with diabetes plus 1 major ASCVD risk  factor, treating to a non-HDL-C goal of <100 mg/dL  (LDL-C of <70 mg/dL) is considered a therapeutic  option.   PSA     Status: None   Collection Time: 01/01/20 10:53 AM  Result Value Ref Range   PSA 1.6 < OR = 4.0 ng/mL    Comment: The total PSA value from this assay system is  standardized against the WHO standard. The test  result will be approximately 20% lower when compared  to the equimolar-standardized total PSA (Beckman  Coulter). Comparison of serial PSA results should be  interpreted with this fact in mind. . This test was performed using the Siemens  chemiluminescent method. Values obtained from  different assay methods cannot be used interchangeably. PSA levels, regardless of value, should not be interpreted as absolute evidence of the presence or absence of disease.   HM DIABETES EYE EXAM     Status: None   Collection Time: 01/02/20 12:00 AM  Result Value Ref Range   HM Diabetic Eye Exam No Retinopathy No Retinopathy    Comment: of the right eye-[facility],  Dr. Inez Pilgrim  POCT HgB A1C     Status: None   Collection Time: 02/07/20  9:36 AM  Result Value Ref Range   Hemoglobin A1C     HbA1c POC (<> result, manual entry)     HbA1c, POC (prediabetic range)     HbA1c, POC (controlled diabetic range) 6.9 0.0 - 7.0 %    Diabetic Foot Exam: Diabetic Foot Exam - Simple   Simple Foot Form Diabetic Foot exam was performed with the following findings: Yes 02/07/2020  9:53 AM  Visual Inspection No deformities, no ulcerations, no other skin breakdown bilaterally: Yes Sensation Testing Intact to touch and monofilament testing bilaterally: Yes Pulse Check Posterior Tibialis and Dorsalis pulse intact bilaterally: Yes Comments     PHQ2/9: Depression screen [facility] 2/9 02/07/2020 01/01/2020 01/01/2020 11/08/2019 07/12/2019  Decreased Interest 0 0 0 0 0  Down, Depressed, Hopeless 0 0 0 0 0  PHQ - 2 Score 0 0 0 0 0  Altered sleeping 0 0 0 0 0  Tired, decreased energy 0 0 0 0 0  Change in appetite 0 0 0 0 0  Feeling bad or failure about yourself  0 0 0 0 0  Trouble concentrating 0 0 0 0 0  Moving slowly or fidgety/restless 0 0 0 0 0  Suicidal thoughts 0 0 0 0 0  PHQ-9 Score 0 0 0 0 0  Difficult doing work/chores - - - - Not difficult at all  Some recent data might be hidden    phq 9 is negative   Fall Risk: Fall Risk  02/07/2020 01/01/2020 11/08/2019 07/12/2019 01/07/2019  Falls in the past year? 0 0 0 0 0  Number falls in past yr: 0 0 0 0 0  Injury with Fall? 0 0 0 0 0      Assessment & Plan  1. Type 2 diabetes mellitus with polyneuropathy (HCC)  - POCT HgB A1C - Ertugliflozin L-PyroglutamicAc (STEGLATRO) 5 MG TABS; Take 5 mg by mouth daily.  Dispense: 90 tablet; Refill: 1 - LEVEMIR FLEXTOUCH 100 UNIT/ML FlexPen; Inject 8 Units into the skin daily at 10 pm.  Dispense: 15 mL; Refill: 2  2. Essential hypertension  At goal   3. GERD without esophagitis  Continue medication   4. Dyslipidemia  He will call back when he needs refills   5.  Hypertension associated with diabetes (HCC)  On ace  6. Long-term insulin use (HCC)  Doing well, no hypoglycemic episodes

## 2020-02-27 ENCOUNTER — Telehealth: Payer: Self-pay | Admitting: Family Medicine

## 2020-02-27 NOTE — Telephone Encounter (Signed)
FYI pts wife came by the office to let Dr Carlynn Purl know he took the [facility] Vaccine and he gets his second one on 02/28/2020

## 2020-02-28 NOTE — Telephone Encounter (Signed)
Moderna vaccines have been entered into his chart.

## 2020-03-06 NOTE — Telephone Encounter (Signed)
OK. It has been documented.

## 2020-03-06 NOTE — Telephone Encounter (Signed)
Pt's wife stated she was supposed to give a number associated with pt's Covid vaccine.  The number is [phone]

## 2020-03-12 ENCOUNTER — Other Ambulatory Visit: Payer: Self-pay

## 2020-03-12 DIAGNOSIS — E1142 Type 2 diabetes mellitus with diabetic polyneuropathy: Secondary | ICD-10-CM

## 2020-03-12 MED ORDER — GABAPENTIN 400 MG PO CAPS: ORAL_CAPSULE | ORAL | 1 refills | Status: DC

## 2020-03-12 NOTE — Telephone Encounter (Signed)
Refill Request for medication. Gabapentin   Last visit: 02/07/2020   Follow up on 05/13/2020

## 2020-04-22 ENCOUNTER — Other Ambulatory Visit: Payer: Self-pay | Admitting: Family Medicine

## 2020-04-22 NOTE — Telephone Encounter (Signed)
Requested Prescriptions  Pending Prescriptions Disp Refills  . CONTOUR TEST test strip [Pharmacy Med Name: CONTOUR TEST STRIPS 100'S] 200 strip 0    Sig: USE AS DIRECTED TO CHECK BLOOD GLUCOSE TWICE DAILY     Endocrinology: Diabetes - Testing Supplies Passed - 04/22/2020 12:54 PM      Passed - Valid encounter within last 12 months    Recent Outpatient Visits          2 months ago Type 2 diabetes mellitus with polyneuropathy [facility])   [facility] Alba Cory, MD   3 months ago Well adult exam   [facility] Alba Cory, MD   5 months ago Type 2 diabetes mellitus with polyneuropathy [facility])   [facility] Alba Cory, MD   9 months ago Essential hypertension   [facility] Sharyon Cable E, NP   1 year ago Uncontrolled type 2 diabetes mellitus with hyperglycemia [facility])   [facility], Janit Bern, MD      Future Appointments            In 3 weeks Alba Cory, MD [facility]

## 2020-04-30 ENCOUNTER — Other Ambulatory Visit: Payer: Self-pay | Admitting: Family Medicine

## 2020-04-30 NOTE — Telephone Encounter (Signed)
Pt needs refill on one touch meter and strips to be sent [employer]

## 2020-05-04 ENCOUNTER — Other Ambulatory Visit: Payer: Self-pay | Admitting: Emergency Medicine

## 2020-05-04 DIAGNOSIS — E1142 Type 2 diabetes mellitus with diabetic polyneuropathy: Secondary | ICD-10-CM

## 2020-05-04 MED ORDER — GLUCOSE BLOOD VI STRP: ORAL_STRIP | 3 refills | Status: DC

## 2020-05-04 MED ORDER — BLOOD GLUCOSE MONITOR KIT: PACK | 0 refills | Status: DC

## 2020-05-04 MED ORDER — GLUCOSE BLOOD VI STRP: ORAL_STRIP | 12 refills | Status: DC

## 2020-05-04 MED ORDER — BLOOD GLUCOSE METER KIT: PACK | 0 refills | Status: DC

## 2020-05-04 NOTE — Telephone Encounter (Signed)
Order sent to [employer]. Patient notified

## 2020-05-04 NOTE — Telephone Encounter (Signed)
Patient is requesting new meter and test strips- One Touch

## 2020-05-04 NOTE — Telephone Encounter (Signed)
Patient called to check the status of test strips and touch meter.

## 2020-05-04 NOTE — Progress Notes (Unsigned)
One touch

## 2020-05-13 ENCOUNTER — Ambulatory Visit: Payer: BC Managed Care – PPO | Admitting: Family Medicine

## 2020-05-13 ENCOUNTER — Encounter: Payer: Self-pay | Admitting: Family Medicine

## 2020-05-13 ENCOUNTER — Other Ambulatory Visit: Payer: Self-pay

## 2020-05-13 VITALS — BP 124/64 | HR 85 | Temp 97.1°F | Resp 16 | Ht 64.0 in | Wt 137.8 lb

## 2020-05-13 DIAGNOSIS — E1142 Type 2 diabetes mellitus with diabetic polyneuropathy: Secondary | ICD-10-CM

## 2020-05-13 DIAGNOSIS — Z794 Long term (current) use of insulin: Secondary | ICD-10-CM | POA: Diagnosis not present

## 2020-05-13 DIAGNOSIS — E785 Hyperlipidemia, unspecified: Secondary | ICD-10-CM

## 2020-05-13 DIAGNOSIS — Z8781 Personal history of (healed) traumatic fracture: Secondary | ICD-10-CM

## 2020-05-13 DIAGNOSIS — E1159 Type 2 diabetes mellitus with other circulatory complications: Secondary | ICD-10-CM | POA: Diagnosis not present

## 2020-05-13 DIAGNOSIS — K219 Gastro-esophageal reflux disease without esophagitis: Secondary | ICD-10-CM | POA: Diagnosis not present

## 2020-05-13 DIAGNOSIS — I10 Essential (primary) hypertension: Secondary | ICD-10-CM

## 2020-05-13 DIAGNOSIS — I152 Hypertension secondary to endocrine disorders: Secondary | ICD-10-CM

## 2020-05-13 LAB — POCT GLYCOSYLATED HEMOGLOBIN (HGB A1C): Hemoglobin A1C: 6.7 % — AB (ref 4.0–5.6)

## 2020-05-13 MED ORDER — EMPAGLIFLOZIN-LINAGLIP-METFORM 25-5-1000 MG PO TB24: 1.0000 | ORAL_TABLET | Freq: Every day | ORAL | 1 refills | Status: DC

## 2020-05-13 MED ORDER — LISINOPRIL 10 MG PO TABS: 10.0000 mg | ORAL_TABLET | Freq: Every day | ORAL | 1 refills | Status: DC

## 2020-05-13 MED ORDER — LOVASTATIN 20 MG PO TABS: 20.0000 mg | ORAL_TABLET | Freq: Every day | ORAL | 1 refills | Status: DC

## 2020-05-13 NOTE — Progress Notes (Signed)
Name: John Howard   MRN: [id number]    DOB: August 01, 1956   Date:05/13/2020       Progress Note  Subjective  Chief Complaint  Chief Complaint  Patient presents with  . Diabetes  . Hypertension  . Hyperlipidemia    HPI  DMII: he is complaints with medication, he is down on 8 units of Levemir per night, Metfomin 1500 mg daily and Steglatro 5 mg and A1C today is at goal at 6.7%, discussed simplifying regiment , he agrees and we will switch all three medications to a combo medication called Empagliforzin-Linaglip-metformin 25-03-999 daily . He denies hypoglycemic episodes. No polyphagia, polydipsia or polyuria.. FSBS at home has been 86-to 136 . He has neuropathy on both legs , but pain is controlled with gabapentin 400 mg at night. He also has dyslipidemia and is taking statin therapy    Hyperlipidemia: last LDL was 59, HDL was 50, he is taking statin therapy daily and denies side effects of medication. Continue current regiment   History of PMR: He used to see Dr. Tacy Learn , last visit 08/2019, doing well at this time, off prednisone.   GERD: no indigestion or heartburn, taking pepcid as prescribed, states he avoids spicy food and seems to be helping   HTN at goal, taking medication, no chest pain or palpitation, no dizziness. On Ace denies cough   Vitamin D deficiency: taking otc supplementation , continue daily supplementation   Patient Active Problem List   Diagnosis Date Noted  . Thoracic compression fracture ([place]) 12/05/2018  . Long-term insulin use ([place]) 09/10/2018  . Polymyalgia rheumatica syndrome ([place]) 07/25/2018  . Elevated C-reactive protein (CRP) 07/20/2018  . Carpal tunnel syndrome of right wrist 06/26/2018  . Tobacco use 02/28/2018  . Osteoarthritis of right hand 02/28/2018  . Chronic right shoulder pain 11/23/2016  . Vitamin D insufficiency 05/27/2016  . Second degree hemorrhoids   . Hyperlipidemia 06/23/2015  . Essential hypertension 06/23/2015  . Blind  left eye 06/23/2015    Past Surgical History:  Procedure Laterality Date  . COLONOSCOPY    . COLONOSCOPY WITH PROPOFOL N/A 03/21/2016   Procedure: COLONOSCOPY WITH PROPOFOL;  Surgeon: Lucilla Lame, MD;  Location: [facility];  Service: Endoscopy;  Laterality: N/A;  Diabetic - insulin  . EYE SURGERY      Family History  Problem Relation Age of Onset  . Hypertension Mother   . Diabetes Mother   . Diabetes Maternal Grandmother   . Heart attack Maternal Grandfather     Social History   Tobacco Use  . Smoking status: Former Smoker    Packs/day: 0.25    Years: 44.00    Pack years: 11.00    Types: Cigarettes    Start date: 11/28/1974    Quit date: 02/07/2019    Years since quitting: 1.2  . Smokeless tobacco: Never Used  Substance Use Topics  . Alcohol use: Not Currently    Alcohol/week: 1.0 standard drink    Types: 1 Cans of beer per week    Comment: pt is trying to quit, has not had a drink in a month     Current Outpatient Medications:  .  aspirin 81 MG tablet, Take 81 mg by mouth daily., Disp: , Rfl:  .  BD PEN NEEDLE MICRO U/F 32G X 6 MM MISC, USE EVERYDAY WITH INSULIN, Disp: 100 each, Rfl: 5 .  blood glucose meter kit and supplies KIT, Dispense based on patient and insurance preference. Use up to four times  daily as directed. (FOR ICD-9 250.00, 250.01)., Disp: 1 each, Rfl: 0 .  blood glucose meter kit and supplies, Dispense based on patient and insurance preference. Use up to four times daily as directed. (FOR ICD-10 E10.9, E11.9)., Disp: 1 each, Rfl: 0 .  Cholecalciferol (VITAMIN D PO), Take 25 mg by mouth., Disp: , Rfl:  .  D 1000 25 MCG (1000 UT) capsule, , Disp: , Rfl:  .  Ertugliflozin L-PyroglutamicAc (STEGLATRO) 5 MG TABS, Take 5 mg by mouth daily., Disp: 90 tablet, Rfl: 1 .  famotidine (PEPCID) 20 MG tablet, TAKE 1 TABLET BY MOUTH DAILY AS NEEDED FOR HEARTBURN OR INDEGESTION, Disp: 90 tablet, Rfl: 3 .  gabapentin (NEURONTIN) 400 MG capsule, TAKE 1 CAPSULE(400  MG) BY MOUTH AT BEDTIME, Disp: 90 capsule, Rfl: 1 .  glucose blood test strip, Use as instructed, Disp: 100 each, Rfl: 3 .  Insulin Pen Needle 31G X 8 MM MISC, BD Ultra-Fine Micro Pen Needle 32 gauge x 1/4"  USE EVERYDAY WITH INSULIN, Disp: , Rfl:  .  LEVEMIR FLEXTOUCH 100 UNIT/ML FlexPen, Inject 8 Units into the skin daily at 10 pm., Disp: 15 mL, Rfl: 2 .  lisinopril (ZESTRIL) 10 MG tablet, Take 1 tablet (10 mg total) by mouth daily., Disp: 90 tablet, Rfl: 1 .  lovastatin (MEVACOR) 20 MG tablet, Take 1 tablet (20 mg total) by mouth at bedtime., Disp: 90 tablet, Rfl: 1 .  metFORMIN (GLUCOPHAGE) 500 MG tablet, Take 1-2 tablets (500-1,000 mg total) by mouth 2 (two) times daily with a meal., Disp: 270 tablet, Rfl: 1 .  Microlet Lancets MISC, TEST BLOOD SUGAR THREE TIMES DAILY, Disp: 100 each, Rfl: 5  No Known Allergies  I personally reviewed active problem list, medication list, allergies, family history, social history, health maintenance with the patient/caregiver today.   ROS  Constitutional: Negative for fever or weight change.  Respiratory: Negative for cough and shortness of breath.   Cardiovascular: Negative for chest pain or palpitations.  Gastrointestinal: Negative for abdominal pain, no bowel changes.  Musculoskeletal: Negative for gait problem or joint swelling.  Skin: Negative for rash.  Neurological: Negative for dizziness or headache.  No other specific complaints in a complete review of systems (except as listed in HPI above).  Objective  Vitals:   05/13/20 0828  BP: 124/64  Pulse: 85  Resp: 16  Temp: (!) 97.1 F (36.2 C)  TempSrc: Temporal  SpO2: 98%  Weight: 137 lb 12.8 oz (62.5 kg)  Height: _0  (1.626 m)    Body mass index is 23.65 kg/m.  Physical Exam  Constitutional: Patient appears well-developed and well-nourished.  No distress.  HEENT: head atraumatic, normocephalic, blind on left eye,  neck supple Cardiovascular: Normal rate, regular rhythm and  normal heart sounds.  No murmur heard. No BLE edema. Pulmonary/Chest: Effort normal and breath sounds normal. No respiratory distress. Abdominal: Soft.  There is no tenderness. Psychiatric: Patient has a normal mood and affect. behavior is normal. Judgment and thought content normal.  Recent Results (from the past 2160 hour(s))  POCT HgB A1C     Status: Abnormal   Collection Time: 05/13/20  8:36 AM  Result Value Ref Range   Hemoglobin A1C 6.7 (A) 4.0 - 5.6 %   HbA1c POC (<> result, manual entry)     HbA1c, POC (prediabetic range)     HbA1c, POC (controlled diabetic range)        PHQ2/9: Depression screen [facility] 2/9 05/13/2020 02/07/2020 01/01/2020 01/01/2020 11/08/2019  Decreased  Interest 0 0 0 0 0  Down, Depressed, Hopeless 0 0 0 0 0  PHQ - 2 Score 0 0 0 0 0  Altered sleeping 0 0 0 0 0  Tired, decreased energy 0 0 0 0 0  Change in appetite 0 0 0 0 0  Feeling bad or failure about yourself  0 0 0 0 0  Trouble concentrating 0 0 0 0 0  Moving slowly or fidgety/restless 0 0 0 0 0  Suicidal thoughts 0 0 0 0 0  PHQ-9 Score 0 0 0 0 0  Difficult doing work/chores - - - - -  Some recent data might be hidden    phq 9 is negative   Fall Risk: Fall Risk  05/13/2020 02/07/2020 01/01/2020 11/08/2019 07/12/2019  Falls in the past year? 0 0 0 0 0  Number falls in past yr: 0 0 0 0 0  Injury with Fall? 0 0 0 0 0      Functional Status Survey: Is the patient deaf or have difficulty hearing?: No Does the patient have difficulty seeing, even when wearing glasses/contacts?: No Does the patient have difficulty concentrating, remembering, or making decisions?: No Does the patient have difficulty walking or climbing stairs?: No Does the patient have difficulty dressing or bathing?: No Does the patient have difficulty doing errands alone such as visiting a doctor's office or shopping?: No   Assessment & Plan  1. Type 2 diabetes mellitus with polyneuropathy (HCC)  - POCT HgB A1C - Microalbumin /  creatinine urine ratio - lovastatin (MEVACOR) 20 MG tablet; Take 1 tablet (20 mg total) by mouth at bedtime.  Dispense: 90 tablet; Refill: 1 - Empagliflozin-Linaglip-Metform 25-03-999 MG TB24; Take 1 tablet by mouth daily.  Dispense: 90 tablet; Refill: 1  2. Dyslipidemia  - lovastatin (MEVACOR) 20 MG tablet; Take 1 tablet (20 mg total) by mouth at bedtime.  Dispense: 90 tablet; Refill: 1  3. Essential hypertension  - lisinopril (ZESTRIL) 10 MG tablet; Take 1 tablet (10 mg total) by mouth daily.  Dispense: 90 tablet; Refill: 1  4. Hypertension associated with diabetes ([place])  Doing well   5. GERD without esophagitis  controlled  6. History of vertebral compression fracture  Doing well   7. Long-term insulin use (HCC)  We will stop it now, still taking levemir

## 2020-05-13 NOTE — Patient Instructions (Signed)
We will replaced Metformin, Levemir and Steglatro with one pill that was sent to your pharmacy called Empaglifozin-linaglip-Metformin 25-03-999 daily

## 2020-05-14 LAB — MICROALBUMIN / CREATININE URINE RATIO
Creatinine, Urine: 89 mg/dL (ref 20–320)
Microalb, Ur: <0.2 mg/dL

## 2020-05-28 ENCOUNTER — Other Ambulatory Visit: Payer: Self-pay | Admitting: Family Medicine

## 2020-07-23 ENCOUNTER — Other Ambulatory Visit: Payer: Self-pay

## 2020-08-05 ENCOUNTER — Other Ambulatory Visit: Payer: Self-pay | Admitting: Family Medicine

## 2020-08-05 DIAGNOSIS — K219 Gastro-esophageal reflux disease without esophagitis: Secondary | ICD-10-CM

## 2020-08-05 MED ORDER — FAMOTIDINE 20 MG PO TABS: ORAL_TABLET | ORAL | 1 refills | Status: DC

## 2020-08-05 NOTE — Telephone Encounter (Signed)
Approved per protocol.  Requested Prescriptions  Pending Prescriptions Disp Refills  . famotidine (PEPCID) 20 MG tablet 90 tablet 1    Sig: TAKE 1 TABLET BY MOUTH DAILY AS NEEDED FOR HEARTBURN OR INDEGESTION     Gastroenterology:  H2 Antagonists Passed - 08/05/2020 10:26 AM      Passed - Valid encounter within last 12 months    Recent Outpatient Visits          2 months ago Type 2 diabetes mellitus with polyneuropathy [facility])   [facility], Danna Hefty, MD   6 months ago Type 2 diabetes mellitus with polyneuropathy [facility])   [facility] Alba Cory, MD   7 months ago Well adult exam   [facility] Alba Cory, MD   9 months ago Type 2 diabetes mellitus with polyneuropathy [facility])   [facility] Alba Cory, MD   1 year ago Essential hypertension   [facility] Cheryle Horsfall, NP      Future Appointments            In 1 week Alba Cory, MD [facility], [facility]

## 2020-08-05 NOTE — Telephone Encounter (Signed)
Medication Refill - Medication: famotidine (PEPCID) 20 MG tablet (Patient stated that pharmacy has been trying to get in contact with office multiple times to have medication refill sent over.)   Has the patient contacted their pharmacy? yes (Agent: If no, request that the patient contact the pharmacy for the refill.) (Agent: If yes, when and what did the pharmacy advise?)Contact PCP  Preferred Pharmacy (with phone number or street name):  [id number] [place], [place] - [address] AT [facility] Phone:  [phone]  Fax:  [phone]       Agent: Please be advised that RX refills may take up to 3 business days. We ask that you follow-up with your pharmacy.

## 2020-08-12 NOTE — Progress Notes (Signed)
Name: John Howard   MRN: [id number]    DOB: July 23, 1956   Date:08/14/2020       Progress Note  Subjective  Chief Complaint  Chief Complaint  Patient presents with  . Diabetes  . Hypertension  . Hyperlipidemia    HPI  DMII: he is complaints with medication, he is off Levemir, he is taking one pill:  Empagliforzin-Linaglip-metformin 25-03-999 daily . He denies hypoglycemic episodes. No polyphagia, polydipsia or polyuria.. FSBS at home has been 91-146, usually 120's, A1C hsa gone up a little, but still at goal at 7 %. He had more fruit during the Summer . He has neuropathy on both legs , but pain is controlled with gabapentin 400 mg at night. He also has dyslipidemia and is taking statin therapy  Urine micro negative    Hyperlipidemia: last LDL was 59, continue statin therapy, no myalgias   History of PMR: He used to see Dr. Tacy Learn , last visit 08/2019, doing well at this time, off prednisone. Unchanged   GERD: no indigestion or heartburn, taking pepcid at night. He is avoiding spicy food, only has fried food occasionally   HTN : bp towards low end of normal, no orthostatic changes, he is on low dose ACE and bp seems to go up and down, we will continue current regiment but he will monitor for dizziness. No chest pain or palpitation On Ace denies cough   Vitamin D deficiency: taking otc supplementation, unchanged    Patient Active Problem List   Diagnosis Date Noted  . Carpal tunnel syndrome of right wrist 06/26/2018  . Tobacco use 02/28/2018  . Osteoarthritis of right hand 02/28/2018  . Chronic right shoulder pain 11/23/2016  . Vitamin D insufficiency 05/27/2016  . Second degree hemorrhoids   . Hyperlipidemia 06/23/2015  . Essential hypertension 06/23/2015  . Blind left eye 06/23/2015    Past Surgical History:  Procedure Laterality Date  . COLONOSCOPY    . COLONOSCOPY WITH PROPOFOL N/A 03/21/2016   Procedure: COLONOSCOPY WITH PROPOFOL;  Surgeon: Lucilla Lame,  MD;  Location: [place];  Service: Endoscopy;  Laterality: N/A;  Diabetic - insulin  . EYE SURGERY      Family History  Problem Relation Age of Onset  . Hypertension Mother   . Diabetes Mother   . Diabetes Maternal Grandmother   . Heart attack Maternal Grandfather     Social History   Tobacco Use  . Smoking status: Former Smoker    Packs/day: 0.25    Years: 44.00    Pack years: 11.00    Types: Cigarettes    Start date: 11/28/1974    Quit date: 02/07/2019    Years since quitting: 1.5  . Smokeless tobacco: Never Used  Substance Use Topics  . Alcohol use: Not Currently    Alcohol/week: 1.0 standard drink    Types: 1 Cans of beer per week    Comment: pt is trying to quit, has not had a drink in a month     Current Outpatient Medications:  .  aspirin 81 MG tablet, Take 81 mg by mouth daily., Disp: , Rfl:  .  BD PEN NEEDLE MICRO U/F 32G X 6 MM MISC, USE EVERYDAY WITH INSULIN, Disp: 100 each, Rfl: 5 .  blood glucose meter kit and supplies KIT, Dispense based on patient and insurance preference. Use up to four times daily as directed. (FOR ICD-9 250.00, 250.01)., Disp: 1 each, Rfl: 0 .  blood glucose meter kit and supplies, Dispense based  on patient and insurance preference. Use up to four times daily as directed. (FOR ICD-10 E10.9, E11.9)., Disp: 1 each, Rfl: 0 .  Cholecalciferol (VITAMIN D PO), Take 25 mg by mouth., Disp: , Rfl:  .  D 1000 25 MCG (1000 UT) capsule, , Disp: , Rfl:  .  famotidine (PEPCID) 20 MG tablet, TAKE 1 TABLET BY MOUTH DAILY AS NEEDED FOR HEARTBURN OR INDEGESTION, Disp: 90 tablet, Rfl: 1 .  gabapentin (NEURONTIN) 400 MG capsule, TAKE 1 CAPSULE(400 MG) BY MOUTH AT BEDTIME, Disp: 90 capsule, Rfl: 1 .  glucose blood test strip, Use as instructed, Disp: 100 each, Rfl: 3 .  Insulin Pen Needle 31G X 8 MM MISC, BD Ultra-Fine Micro Pen Needle 32 gauge x 1/4"  USE EVERYDAY WITH INSULIN, Disp: , Rfl:  .  lisinopril (ZESTRIL) 10 MG tablet, Take 1 tablet (10 mg  total) by mouth daily., Disp: 90 tablet, Rfl: 1 .  lovastatin (MEVACOR) 20 MG tablet, Take 1 tablet (20 mg total) by mouth at bedtime., Disp: 90 tablet, Rfl: 1 .  Microlet Lancets MISC, TEST BLOOD SUGAR THREE TIMES DAILY, Disp: 100 each, Rfl: 5  No Known Allergies  I personally reviewed active problem list, medication list, allergies, family history, social history, health maintenance with the patient/caregiver today.   ROS  Constitutional: Negative for fever or weight change.  Respiratory: Negative for cough and shortness of breath.   Cardiovascular: Negative for chest pain or palpitations.  Gastrointestinal: Negative for abdominal pain, no bowel changes.  Musculoskeletal: Negative for gait problem or joint swelling.  Skin: Negative for rash.  Neurological: Negative for dizziness or headache.  No other specific complaints in a complete review of systems (except as listed in HPI above).  Objective  Vitals:   08/14/20 0933  BP: 104/70  Pulse: 73  Resp: 16  Temp: 98 F (36.7 C)  TempSrc: Oral  SpO2: 99%  Weight: 139 lb 12.8 oz (63.4 kg)  Height: 5' 4"  (1.626 m)    Body mass index is 24 kg/m.  Physical Exam  Constitutional: Patient appears well-developed and well-nourished.No distress.  HEENT: head atraumatic, normocephalic, blind of left eye with strabismus - secondary to eye injury , neck supple Cardiovascular: Normal rate, regular rhythm and normal heart sounds.  No murmur heard. No BLE edema. Pulmonary/Chest: Effort normal and breath sounds normal. No respiratory distress. Abdominal: Soft.  There is no tenderness. Psychiatric: Patient has a normal mood and affect. behavior is normal. Judgment and thought content normal.  Recent Results (from the past 2160 hour(s))  POCT HgB A1C     Status: Abnormal   Collection Time: 08/14/20  9:36 AM  Result Value Ref Range   Hemoglobin A1C 7.0 (A) 4.0 - 5.6 %   HbA1c POC (<> result, manual entry)     HbA1c, POC (prediabetic  range)     HbA1c, POC (controlled diabetic range)       PHQ2/9: Depression screen [facility] 2/9 08/14/2020 05/13/2020 02/07/2020 01/01/2020 01/01/2020  Decreased Interest 0 0 0 0 0  Down, Depressed, Hopeless 0 0 0 0 0  PHQ - 2 Score 0 0 0 0 0  Altered sleeping - 0 0 0 0  Tired, decreased energy - 0 0 0 0  Change in appetite - 0 0 0 0  Feeling bad or failure about yourself  - 0 0 0 0  Trouble concentrating - 0 0 0 0  Moving slowly or fidgety/restless - 0 0 0 0  Suicidal thoughts - 0  0 0 0  PHQ-9 Score - 0 0 0 0  Difficult doing work/chores - - - - -  Some recent data might be hidden    phq 9 is negative   Fall Risk: Fall Risk  08/14/2020 08/14/2020 05/13/2020 02/07/2020 01/01/2020  Falls in the past year? 0 0 0 0 0  Number falls in past yr: 0 0 0 0 0  Injury with Fall? 0 0 0 0 0    Functional Status Survey: Is the patient deaf or have difficulty hearing?: No Does the patient have difficulty seeing, even when wearing glasses/contacts?: No Does the patient have difficulty concentrating, remembering, or making decisions?: No Does the patient have difficulty walking or climbing stairs?: No Does the patient have difficulty dressing or bathing?: No Does the patient have difficulty doing errands alone such as visiting a doctor's office or shopping?: No    Assessment & Plan  1. Type 2 diabetes mellitus with polyneuropathy (HCC)  - POCT HgB A1C - Empagliflozin-Linaglip-Metform (TRIJARDY XR) 25-03-999 MG TB24; Take 1 tablet by mouth daily.  Dispense: 90 tablet; Refill: 1, he was on multiple medications, tolerating once daily medication better, we will try PA. Compliance is high  2. Need for immunization against influenza  - Flu Vaccine QUAD 36+ mos IM  3. Hypertension associated with diabetes ([place])  We will continue ace for now   4. Dyslipidemia  Continue statin therapy   5. GERD without esophagitis  Under goal   6. Essential hypertension

## 2020-08-14 ENCOUNTER — Encounter: Payer: Self-pay | Admitting: Family Medicine

## 2020-08-14 ENCOUNTER — Ambulatory Visit: Payer: 59 | Admitting: Family Medicine

## 2020-08-14 ENCOUNTER — Other Ambulatory Visit: Payer: Self-pay

## 2020-08-14 VITALS — BP 104/70 | HR 73 | Temp 98.0°F | Resp 16 | Ht 64.0 in | Wt 139.8 lb

## 2020-08-14 DIAGNOSIS — E1159 Type 2 diabetes mellitus with other circulatory complications: Secondary | ICD-10-CM

## 2020-08-14 DIAGNOSIS — I152 Hypertension secondary to endocrine disorders: Secondary | ICD-10-CM

## 2020-08-14 DIAGNOSIS — E785 Hyperlipidemia, unspecified: Secondary | ICD-10-CM

## 2020-08-14 DIAGNOSIS — Z23 Encounter for immunization: Secondary | ICD-10-CM

## 2020-08-14 DIAGNOSIS — E1142 Type 2 diabetes mellitus with diabetic polyneuropathy: Secondary | ICD-10-CM

## 2020-08-14 DIAGNOSIS — K219 Gastro-esophageal reflux disease without esophagitis: Secondary | ICD-10-CM

## 2020-08-14 DIAGNOSIS — I10 Essential (primary) hypertension: Secondary | ICD-10-CM

## 2020-08-14 LAB — POCT GLYCOSYLATED HEMOGLOBIN (HGB A1C): Hemoglobin A1C: 7 % — AB (ref 4.0–5.6)

## 2020-08-14 MED ORDER — TRIJARDY XR 25-5-1000 MG PO TB24: 1.0000 | ORAL_TABLET | Freq: Every day | ORAL | 1 refills | Status: DC

## 2020-08-14 MED ORDER — GLUCOSE BLOOD VI STRP: ORAL_STRIP | 3 refills | Status: DC

## 2020-10-24 DIAGNOSIS — Z20822 Contact with and (suspected) exposure to covid-19: Secondary | ICD-10-CM | POA: Diagnosis not present

## 2020-12-01 NOTE — Progress Notes (Signed)
Name: John Howard   MRN: [id number]    DOB: 1956/02/29   Date:12/02/2020       Progress Note  Subjective  Chief Complaint  Follow up   HPI    DMII: he is complaints with medication, only taking one pill, Empagliforzin-Linaglip-metformin 25-03-999 daily . He denies hypoglycemic episodes. No polyphagia, polydipsia or polyuria.. FSBS at home has been around 120 at home, lowest lately was 89  usually 120's. A1C went down from 7 % to 6.8%. He has neuropathy on both legs , but pain is controlled with gabapentin 400 mg prn at night only. He also has dyslipidemia and is taking statin therapy  He is due for urine micro today    Hyperlipidemia: last LDL was 59, continue statin therapy, no myalgias He is due for labs today   History of PMR: He used to see Dr. Louanna Raw , last visit 08/2019, doing well at this time, off prednisone. States only has occasional aches here and there, not like it used to be   GERD: no indigestion or heartburn, taking pepcid at night. He is avoiding spicy food, only has fried food occasionally . Doing well now   HTN : bp towards low end of normal, no orthostatic changes, he is on low dose ACE and bp seems to go up and down, we will continue current regiment but he will monitor for dizziness. He is feeling well, we will check urine micro today and continue current regiment   Vitamin D deficiency: taking otc supplementation. Unchanged    Patient Active Problem List   Diagnosis Date Noted  . Carpal tunnel syndrome of right wrist 06/26/2018  . Tobacco use 02/28/2018  . Osteoarthritis of right hand 02/28/2018  . Chronic right shoulder pain 11/23/2016  . Vitamin D insufficiency 05/27/2016  . Second degree hemorrhoids   . Hyperlipidemia 06/23/2015  . Essential hypertension 06/23/2015  . Blind left eye 06/23/2015    Past Surgical History:  Procedure Laterality Date  . COLONOSCOPY    . COLONOSCOPY WITH PROPOFOL N/A 03/21/2016   Procedure: COLONOSCOPY WITH  PROPOFOL;  Surgeon: Midge Minium, MD;  [facility];  Service: Endoscopy;  Laterality: N/A;  Diabetic - insulin  . EYE SURGERY      Family History  Problem Relation Age of Onset  . Hypertension Mother   . Diabetes Mother   . Diabetes Maternal Grandmother   . Heart attack Maternal Grandfather     Social History   Tobacco Use  . Smoking status: Former Smoker    Packs/day: 0.25    Years: 44.00    Pack years: 11.00    Types: Cigarettes    Start date: 11/28/1974    Quit date: 02/07/2019    Years since quitting: 1.8  . Smokeless tobacco: Never Used  Substance Use Topics  . Alcohol use: Not Currently    Alcohol/week: 1.0 standard drink    Types: 1 Cans of beer per week    Comment: pt is trying to quit, has not had a drink in a month     Current Outpatient Medications:  .  aspirin 81 MG tablet, Take 81 mg by mouth daily., Disp: , Rfl:  .  Cholecalciferol (VITAMIN D PO), Take 25 mg by mouth., Disp: , Rfl:  .  D 1000 25 MCG (1000 UT) capsule, , Disp: , Rfl:  .  Empagliflozin-Linaglip-Metform (TRIJARDY XR) 25-03-999 MG TB24, Take 1 tablet by mouth daily., Disp: 90 tablet, Rfl: 1 .  famotidine (PEPCID) 20  MG tablet, TAKE 1 TABLET BY MOUTH DAILY AS NEEDED FOR HEARTBURN OR INDEGESTION, Disp: 90 tablet, Rfl: 1 .  gabapentin (NEURONTIN) 400 MG capsule, TAKE 1 CAPSULE(400 MG) BY MOUTH AT BEDTIME, Disp: 90 capsule, Rfl: 1 .  glucose blood test strip, Check once daily, Disp: 100 each, Rfl: 3 .  lisinopril (ZESTRIL) 10 MG tablet, Take 1 tablet (10 mg total) by mouth daily., Disp: 90 tablet, Rfl: 1 .  lovastatin (MEVACOR) 20 MG tablet, Take 1 tablet (20 mg total) by mouth at bedtime., Disp: 90 tablet, Rfl: 1 .  Microlet Lancets MISC, TEST BLOOD SUGAR THREE TIMES DAILY, Disp: 100 each, Rfl: 5  No Known Allergies  I personally reviewed active problem list, medication list, allergies, family history, social history, health maintenance with the patient/caregiver  today.   ROS  Constitutional: Negative for fever or weight change.  Respiratory: Negative for cough and shortness of breath.   Cardiovascular: Negative for chest pain or palpitations.  Gastrointestinal: Negative for abdominal pain, no bowel changes.  Musculoskeletal: Negative for gait problem or joint swelling.  Skin: Negative for rash.  Neurological: Negative for dizziness or headache.  No other specific complaints in a complete review of systems (except as listed in HPI above).  Objective  Vitals:   12/02/20 1005  BP: 110/72  Pulse: 84  Resp: 18  Temp: 98 F (36.7 C)  TempSrc: Oral  SpO2: 95%  Weight: 136 lb (61.7 kg)  Height: 5\' 4"  (1.626 m)    Body mass index is 23.34 kg/m.  Physical Exam  Constitutional: Patient appears well-developed and thin  No distress.  HEENT: head atraumatic, normocephalic, strabismus left eye , neck supple, Cardiovascular: Normal rate, regular rhythm and normal heart sounds.  No murmur heard. No BLE edema. Pulmonary/Chest: Effort normal and breath sounds normal. No respiratory distress. Abdominal: Soft.  There is no tenderness. Psychiatric: Patient has a normal mood and affect. behavior is normal. Judgment and thought content normal.  Recent Results (from the past 2160 hour(s))  POCT HgB A1C     Status: Abnormal   Collection Time: 12/02/20 10:07 AM  Result Value Ref Range   Hemoglobin A1C 6.7 (A) 4.0 - 5.6 %   HbA1c POC (<> result, manual entry)     HbA1c, POC (prediabetic range)     HbA1c, POC (controlled diabetic range)        PHQ2/9: Depression screen [facility] 2/9 12/02/2020 08/14/2020 05/13/2020 02/07/2020 01/01/2020  Decreased Interest 0 0 0 0 0  Down, Depressed, Hopeless 0 0 0 0 0  PHQ - 2 Score 0 0 0 0 0  Altered sleeping - - 0 0 0  Tired, decreased energy - - 0 0 0  Change in appetite - - 0 0 0  Feeling bad or failure about yourself  - - 0 0 0  Trouble concentrating - - 0 0 0  Moving slowly or fidgety/restless - - 0 0 0   Suicidal thoughts - - 0 0 0  PHQ-9 Score - - 0 0 0  Difficult doing work/chores - - - - -  Some recent data might be hidden    phq 9 is negative   Fall Risk: Fall Risk  12/02/2020 08/14/2020 08/14/2020 05/13/2020 02/07/2020  Falls in the past year? 0 0 0 0 0  Number falls in past yr: 0 0 0 0 0  Injury with Fall? 0 0 0 0 0     Functional Status Survey: Is the patient deaf or have difficulty hearing?:  No Does the patient have difficulty seeing, even when wearing glasses/contacts?: No Does the patient have difficulty concentrating, remembering, or making decisions?: No Does the patient have difficulty walking or climbing stairs?: No Does the patient have difficulty dressing or bathing?: No Does the patient have difficulty doing errands alone such as visiting a doctor's office or shopping?: No   Assessment & Plan  1. Type 2 diabetes mellitus with polyneuropathy (HCC)  - POCT HgB A1C - Lipid panel - Empagliflozin-Linaglip-Metform (TRIJARDY XR) 25-03-999 MG TB24; Take 1 tablet by mouth daily.  Dispense: 90 tablet; Refill: 1 - lovastatin (MEVACOR) 20 MG tablet; Take 1 tablet (20 mg total) by mouth at bedtime.  Dispense: 90 tablet; Refill: 1  2. Dyslipidemia  - lovastatin (MEVACOR) 20 MG tablet; Take 1 tablet (20 mg total) by mouth at bedtime.  Dispense: 90 tablet; Refill: 1  3. Hypertension associated with diabetes (HCC)  - Microalbumin / creatinine urine ratio  4. Essential hypertension  Continue lisinopril   5. GERD without esophagitis   6. Tobacco use  Down to 2-3 most days of the week   7. Encounter for medication monitoring  - CBC with Differential/Platelet - COMPLETE METABOLIC PANEL WITH GFR

## 2020-12-02 ENCOUNTER — Other Ambulatory Visit: Payer: Self-pay

## 2020-12-02 ENCOUNTER — Encounter: Payer: Self-pay | Admitting: Family Medicine

## 2020-12-02 ENCOUNTER — Ambulatory Visit: Payer: 59 | Admitting: Family Medicine

## 2020-12-02 VITALS — BP 110/72 | HR 84 | Temp 98.0°F | Resp 18 | Ht 64.0 in | Wt 136.0 lb

## 2020-12-02 DIAGNOSIS — E1142 Type 2 diabetes mellitus with diabetic polyneuropathy: Secondary | ICD-10-CM

## 2020-12-02 DIAGNOSIS — K219 Gastro-esophageal reflux disease without esophagitis: Secondary | ICD-10-CM | POA: Diagnosis not present

## 2020-12-02 DIAGNOSIS — I10 Essential (primary) hypertension: Secondary | ICD-10-CM | POA: Diagnosis not present

## 2020-12-02 DIAGNOSIS — E1159 Type 2 diabetes mellitus with other circulatory complications: Secondary | ICD-10-CM | POA: Diagnosis not present

## 2020-12-02 DIAGNOSIS — E785 Hyperlipidemia, unspecified: Secondary | ICD-10-CM

## 2020-12-02 DIAGNOSIS — I152 Hypertension secondary to endocrine disorders: Secondary | ICD-10-CM | POA: Diagnosis not present

## 2020-12-02 DIAGNOSIS — Z5181 Encounter for therapeutic drug level monitoring: Secondary | ICD-10-CM

## 2020-12-02 DIAGNOSIS — Z720 Tobacco use: Secondary | ICD-10-CM | POA: Diagnosis not present

## 2020-12-02 LAB — POCT GLYCOSYLATED HEMOGLOBIN (HGB A1C): Hemoglobin A1C: 6.7 % — AB (ref 4.0–5.6)

## 2020-12-02 MED ORDER — LOVASTATIN 20 MG PO TABS: 20.0000 mg | ORAL_TABLET | Freq: Every day | ORAL | 1 refills | Status: DC

## 2020-12-02 MED ORDER — TRIJARDY XR 25-5-1000 MG PO TB24: 1.0000 | ORAL_TABLET | Freq: Every day | ORAL | 1 refills | Status: DC

## 2020-12-03 LAB — COMPLETE METABOLIC PANEL WITH GFR
AG Ratio: 1.6 (calc) (ref 1.0–2.5)
ALT: 12 U/L (ref 9–46)
AST: 9 U/L — ABNORMAL LOW (ref 10–35)
Albumin: 4.5 g/dL (ref 3.6–5.1)
Alkaline phosphatase (APISO): 57 U/L (ref 35–144)
BUN: 20 mg/dL (ref 7–25)
CO2: 27 mmol/L (ref 20–32)
Calcium: 10.2 mg/dL (ref 8.6–10.3)
Chloride: 103 mmol/L (ref 98–110)
Creat: 1.11 mg/dL (ref 0.70–1.25)
GFR, Est African American: 81 mL/min/{1.73_m2} (ref >=60)
GFR, Est Non African American: 70 mL/min/{1.73_m2} (ref >=60)
Globulin: 2.8 g/dL (calc) (ref 1.9–3.7)
Glucose, Bld: 95 mg/dL (ref 65–99)
Potassium: 4.8 mmol/L (ref 3.5–5.3)
Sodium: 138 mmol/L (ref 135–146)
Total Bilirubin: 0.3 mg/dL (ref 0.2–1.2)
Total Protein: 7.3 g/dL (ref 6.1–8.1)

## 2020-12-03 LAB — CBC WITH DIFFERENTIAL/PLATELET
Absolute Monocytes: 583 cells/uL (ref 200–950)
Basophils Absolute: 22 cells/uL (ref 0–200)
Basophils Relative: 0.4 %
Eosinophils Absolute: 259 cells/uL (ref 15–500)
Eosinophils Relative: 4.8 %
HCT: 43.9 % (ref 38.5–50.0)
Hemoglobin: 15.2 g/dL (ref 13.2–17.1)
Lymphs Abs: 1798 cells/uL (ref 850–3900)
MCH: 31.4 pg (ref 27.0–33.0)
MCHC: 34.6 g/dL (ref 32.0–36.0)
MCV: 90.7 fL (ref 80.0–100.0)
MPV: 11 fL (ref 7.5–12.5)
Monocytes Relative: 10.8 %
Neutro Abs: 2738 cells/uL (ref 1500–7800)
Neutrophils Relative %: 50.7 %
Platelets: 240 10*3/uL (ref 140–400)
RBC: 4.84 10*6/uL (ref 4.20–5.80)
RDW: 12.7 % (ref 11.0–15.0)
Total Lymphocyte: 33.3 %
WBC: 5.4 10*3/uL (ref 3.8–10.8)

## 2020-12-03 LAB — LIPID PANEL
Cholesterol: 133 mg/dL (ref <200)
HDL: 64 mg/dL (ref >=40)
LDL Cholesterol (Calc): 54 mg/dL (calc)
Non-HDL Cholesterol (Calc): 69 mg/dL (calc) (ref <130)
Total CHOL/HDL Ratio: 2.1 (calc) (ref <5.0)
Triglycerides: 71 mg/dL (ref <150)

## 2020-12-03 LAB — MICROALBUMIN / CREATININE URINE RATIO
Creatinine, Urine: 70 mg/dL (ref 20–320)
Microalb Creat Ratio: 3 mcg/mg creat (ref <30)
Microalb, Ur: 0.2 mg/dL

## 2020-12-17 ENCOUNTER — Other Ambulatory Visit: Payer: Self-pay | Admitting: Family Medicine

## 2020-12-17 DIAGNOSIS — I10 Essential (primary) hypertension: Secondary | ICD-10-CM

## 2020-12-17 DIAGNOSIS — E1142 Type 2 diabetes mellitus with diabetic polyneuropathy: Secondary | ICD-10-CM

## 2021-01-01 DIAGNOSIS — E119 Type 2 diabetes mellitus without complications: Secondary | ICD-10-CM | POA: Diagnosis not present

## 2021-01-01 LAB — HM DIABETES EYE EXAM

## 2021-02-09 ENCOUNTER — Other Ambulatory Visit: Payer: Self-pay | Admitting: Family Medicine

## 2021-02-09 DIAGNOSIS — K219 Gastro-esophageal reflux disease without esophagitis: Secondary | ICD-10-CM
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
## 2021-04-12 NOTE — Progress Notes (Signed)
Name: John Howard   MRN: [id number]    DOB: 11/25/1956   Date:04/14/2021       Progress Note  Subjective  Chief Complaint  Follow Up  HPI  DMII: he is complaints with medication, only taking one pill, Empagliforzin-Linaglip-metformin 25-03-999 daily . He denies hypoglycemic episodes. No polyphagia, polydipsia or polyuria.. FSBS at home lowest lately was 74-140, average in the 120's ragne. Marland Kitchen A1C went down from 7 % to 6.8% today is 6.9% . He has neuropathy on both legs , but pain is controlled with gabapentin 400 mg prn at night only. He also has dyslipidemia and is taking statin therapy  urine micro negative    Hyperlipidemia: last LDL was 54, continue statin therapy, no myalgias   History of PMR: He used to see Dr. Louanna Raw , last visit 08/2019, doing well at this time, off prednisone. States only has occasional aches here and there, but only when he is doing more physical activity, like lifting boxes  GERD: no indigestion or heartburn, taking pepcid at night. He is avoiding spicy food, he eats fried food very seldom   HTN : bp towards low end of normal, no orthostatic changes, he is on low dose ACE and today we will go down from 10 mg to 5 mg, advised him to check bp at home. No chest pain , he has palpitation very seldom and lasts seconds and not associated with sob or diaphoresis, he states episodes at most once month, advised to make a log   Vitamin D deficiency: taking otc supplementation. Unchanged    Patient Active Problem List   Diagnosis Date Noted  . Carpal tunnel syndrome of right wrist 06/26/2018  . Tobacco use 02/28/2018  . Osteoarthritis of right hand 02/28/2018  . Chronic right shoulder pain 11/23/2016  . Vitamin D insufficiency 05/27/2016  . Second degree hemorrhoids   . Type 2 diabetes mellitus with polyneuropathy (HCC) 11/25/2015  . Hyperlipidemia 06/23/2015  . Hypertension associated with diabetes (HCC) 06/23/2015  . Blind left eye 06/23/2015     Past Surgical History:  Procedure Laterality Date  . COLONOSCOPY    . COLONOSCOPY WITH PROPOFOL N/A 03/21/2016   Procedure: COLONOSCOPY WITH PROPOFOL;  Surgeon: Midge Minium, MD;  Location: [facility];  Service: Endoscopy;  Laterality: N/A;  Diabetic - insulin  . EYE SURGERY      Family History  Problem Relation Age of Onset  . Hypertension Mother   . Diabetes Mother   . Diabetes Maternal Grandmother   . Heart attack Maternal Grandfather     Social History   Tobacco Use  . Smoking status: Current Some Day Smoker    Packs/day: 0.25    Years: 44.00    Pack years: 11.00    Types: Cigarettes    Start date: 11/28/1974    Last attempt to quit: 02/07/2019    Years since quitting: 2.1  . Smokeless tobacco: Never Used  . Tobacco comment: he has been smoking a few times a week again  Substance Use Topics  . Alcohol use: Not Currently    Alcohol/week: 1.0 standard drink    Types: 1 Cans of beer per week    Comment: pt is trying to quit, has not had a drink in a month     Current Outpatient Medications:  .  aspirin 81 MG tablet, Take 81 mg by mouth daily., Disp: , Rfl:  .  Cholecalciferol (VITAMIN D PO), Take 25 mg by mouth., Disp: , Rfl:  .  D 1000 25 MCG (1000 UT) capsule, , Disp: , Rfl:  .  famotidine (PEPCID) 20 MG tablet, TAKE 1 TABLET BY MOUTH EVERY DAY AS NEEDED FOR HEARTBURN OR INDEGESTION, Disp: 90 tablet, Rfl: 1 .  lisinopril (ZESTRIL) 5 MG tablet, Take 1 tablet (5 mg total) by mouth daily., Disp: 90 tablet, Rfl: 1 .  Empagliflozin-Linaglip-Metform (TRIJARDY XR) 25-03-999 MG TB24, Take 1 tablet by mouth daily., Disp: 90 tablet, Rfl: 1 .  gabapentin (NEURONTIN) 400 MG capsule, Take 1 capsule (400 mg total) by mouth at bedtime., Disp: 90 capsule, Rfl: 1 .  glucose blood test strip, Check once daily, Disp: 50 each, Rfl: 3 .  lovastatin (MEVACOR) 20 MG tablet, Take 1 tablet (20 mg total) by mouth at bedtime., Disp: 90 tablet, Rfl: 1 .  Microlet Lancets MISC, TEST  BLOOD SUGAR THREE TIMES DAILY, Disp: 100 each, Rfl: 5  No Known Allergies  I personally reviewed active problem list, medication list, allergies, family history, social history, health maintenance with the patient/caregiver today.   ROS  Constitutional: Negative for fever or weight change.  Respiratory: Negative for cough and shortness of breath.   Cardiovascular: Negative for chest pain or palpitations.  Gastrointestinal: Negative for abdominal pain, no bowel changes.  Musculoskeletal: Negative for gait problem or joint swelling.  Skin: Negative for rash.  Neurological: Negative for dizziness or headache.  No other specific complaints in a complete review of systems (except as listed in HPI above).  Objective  Vitals:   04/14/21 0831  BP: 112/68  Pulse: 77  Resp: 16  Temp: 97.6 F (36.4 C)  TempSrc: Oral  SpO2: 98%  Weight: 135 lb (61.2 kg)  Height: 5\' 4"  (1.626 m)    Body mass index is 23.17 kg/m.  Physical Exam  Constitutional: Patient appears well-developed and well-nourished. No distress.  HEENT: head atraumatic, normocephalic, pupils equal and reactive to light neck supple Cardiovascular: Normal rate, regular rhythm and normal heart sounds.  No murmur heard. No BLE edema. Pulmonary/Chest: Effort normal and breath sounds normal. No respiratory distress. Abdominal: Soft.  There is no tenderness. Psychiatric: Patient has a normal mood and affect. behavior is normal. Judgment and thought content normal.  Recent Results (from the past 2160 hour(s))  POCT HgB A1C     Status: Abnormal   Collection Time: 04/14/21  8:35 AM  Result Value Ref Range   Hemoglobin A1C 6.9 (A) 4.0 - 5.6 %   HbA1c POC (<> result, manual entry)     HbA1c, POC (prediabetic range)     HbA1c, POC (controlled diabetic range)      Diabetic Foot Exam: Diabetic Foot Exam - Simple   Simple Foot Form Diabetic Foot exam was performed with the following findings: Yes 04/14/2021  9:02 AM  Visual  Inspection No deformities, no ulcerations, no other skin breakdown bilaterally: Yes Sensation Testing Intact to touch and monofilament testing bilaterally: Yes Pulse Check Posterior Tibialis and Dorsalis pulse intact bilaterally: Yes Comments      PHQ2/9: Depression screen [facility] 2/9 04/14/2021 12/02/2020 08/14/2020 05/13/2020 02/07/2020  Decreased Interest 0 0 0 0 0  Down, Depressed, Hopeless 0 0 0 0 0  PHQ - 2 Score 0 0 0 0 0  Altered sleeping - - - 0 0  Tired, decreased energy - - - 0 0  Change in appetite - - - 0 0  Feeling bad or failure about yourself  - - - 0 0  Trouble concentrating - - - 0 0  Moving  slowly or fidgety/restless - - - 0 0  Suicidal thoughts - - - 0 0  PHQ-9 Score - - - 0 0  Difficult doing work/chores - - - - -  Some recent data might be hidden    phq 9 is negative   Fall Risk: Fall Risk  04/14/2021 12/02/2020 08/14/2020 08/14/2020 05/13/2020  Falls in the past year? 0 0 0 0 0  Number falls in past yr: 0 0 0 0 0  Injury with Fall? 0 0 0 0 0     Functional Status Survey: Is the patient deaf or have difficulty hearing?: No Does the patient have difficulty seeing, even when wearing glasses/contacts?: No Does the patient have difficulty concentrating, remembering, or making decisions?: No Does the patient have difficulty walking or climbing stairs?: No Does the patient have difficulty dressing or bathing?: No Does the patient have difficulty doing errands alone such as visiting a doctor's office or shopping?: No   Assessment & Plan  1. Type 2 diabetes mellitus with polyneuropathy (HCC)  - POCT HgB A1C - HM Diabetes Foot Exam - Empagliflozin-Linaglip-Metform (TRIJARDY XR) 25-03-999 MG TB24; Take 1 tablet by mouth daily.  Dispense: 90 tablet; Refill: 1 - gabapentin (NEURONTIN) 400 MG capsule; Take 1 capsule (400 mg total) by mouth at bedtime.  Dispense: 90 capsule; Refill: 1 - lovastatin (MEVACOR) 20 MG tablet; Take 1 tablet (20 mg total) by mouth at bedtime.   Dispense: 90 tablet; Refill: 1 - Microlet Lancets MISC; TEST BLOOD SUGAR THREE TIMES DAILY  Dispense: 100 each; Refill: 5 - glucose blood test strip; Check once daily  Dispense: 50 each; Refill: 3  2. Essential hypertension  - lisinopril (ZESTRIL) 5 MG tablet; Take 1 tablet (5 mg total) by mouth daily.  Dispense: 90 tablet; Refill: 1  3. Dyslipidemia  - lovastatin (MEVACOR) 20 MG tablet; Take 1 tablet (20 mg total) by mouth at bedtime.  Dispense: 90 tablet; Refill: 1  4. GERD without esophagitis   5. Hypertension associated with diabetes (HCC)   6. Tobacco use   7. Mixed hyperlipidemia

## 2021-04-14 ENCOUNTER — Other Ambulatory Visit: Payer: Self-pay

## 2021-04-14 ENCOUNTER — Encounter: Payer: Self-pay | Admitting: Family Medicine

## 2021-04-14 ENCOUNTER — Ambulatory Visit: Payer: 59 | Admitting: Family Medicine

## 2021-04-14 VITALS — BP 112/68 | HR 77 | Temp 97.6°F | Resp 16 | Ht 64.0 in | Wt 135.0 lb

## 2021-04-14 DIAGNOSIS — E1142 Type 2 diabetes mellitus with diabetic polyneuropathy: Secondary | ICD-10-CM

## 2021-04-14 DIAGNOSIS — I152 Hypertension secondary to endocrine disorders: Secondary | ICD-10-CM

## 2021-04-14 DIAGNOSIS — E1159 Type 2 diabetes mellitus with other circulatory complications: Secondary | ICD-10-CM | POA: Diagnosis not present

## 2021-04-14 DIAGNOSIS — I10 Essential (primary) hypertension: Secondary | ICD-10-CM

## 2021-04-14 DIAGNOSIS — E785 Hyperlipidemia, unspecified: Secondary | ICD-10-CM | POA: Diagnosis not present

## 2021-04-14 DIAGNOSIS — E782 Mixed hyperlipidemia: Secondary | ICD-10-CM

## 2021-04-14 DIAGNOSIS — Z720 Tobacco use: Secondary | ICD-10-CM

## 2021-04-14 DIAGNOSIS — K219 Gastro-esophageal reflux disease without esophagitis: Secondary | ICD-10-CM

## 2021-04-14 LAB — POCT GLYCOSYLATED HEMOGLOBIN (HGB A1C): Hemoglobin A1C: 6.9 % — AB (ref 4.0–5.6)

## 2021-04-14 MED ORDER — GLUCOSE BLOOD VI STRP: ORAL_STRIP | 3 refills | Status: DC

## 2021-04-14 MED ORDER — GABAPENTIN 400 MG PO CAPS: 400.0000 mg | ORAL_CAPSULE | Freq: Every day | ORAL | 1 refills | Status: DC

## 2021-04-14 MED ORDER — LISINOPRIL 5 MG PO TABS: 5.0000 mg | ORAL_TABLET | Freq: Every day | ORAL | 1 refills | Status: DC

## 2021-04-14 MED ORDER — TRIJARDY XR 25-5-1000 MG PO TB24
1.0000 | ORAL_TABLET | Freq: Every day | ORAL | 1 refills | Status: DC
Start: 2021-04-14 — End: 2021-08-24

## 2021-04-14 MED ORDER — MICROLET LANCETS MISC: 5 refills | Status: DC

## 2021-04-14 MED ORDER — LOVASTATIN 20 MG PO TABS: 20.0000 mg | ORAL_TABLET | Freq: Every day | ORAL | 1 refills | Status: DC

## 2021-06-02 ENCOUNTER — Other Ambulatory Visit: Payer: Self-pay | Admitting: Family Medicine

## 2021-06-02 DIAGNOSIS — E1142 Type 2 diabetes mellitus with diabetic polyneuropathy: Secondary | ICD-10-CM

## 2021-06-02 DIAGNOSIS — E785 Hyperlipidemia, unspecified: Secondary | ICD-10-CM

## 2021-06-02 NOTE — Telephone Encounter (Signed)
Last ordered 04/14/21 #90 1 refill . Medication requested too soon per interface surescripts.

## 2021-08-17 NOTE — Progress Notes (Deleted)
Name: John Howard   MRN: [id number]    DOB: Apr 03, 1956   Date:08/17/2021       Progress Note  Subjective  Chief Complaint  Follow up  I connected with  John Howard  on 08/17/21 at  8:00 AM EDT by a video enabled telemedicine application and verified that I am speaking with the correct person using two identifiers.  I discussed the limitations of evaluation and management by telemedicine and the availability of in person appointments. The patient expressed understanding and agreed to proceed with the virtual visit  Staff also discussed with the patient that there may be a patient responsible charge related to this service. Patient Location: *** Provider Location: *** Additional Individuals present: ***  HPI  DMII: he is complaints with medication, only taking one pill, Empagliforzin-Linaglip-metformin 25-03-999 daily . He denies hypoglycemic episodes. No polyphagia, polydipsia or polyuria.. FSBS at home lowest lately was 74-140, average in the 120's ragne. Marland Kitchen A1C went down from 7 % to 6.8% today is 6.9% . He has neuropathy on both legs , but pain is controlled with gabapentin 400 mg prn at night only. He also has dyslipidemia and is taking statin therapy  urine micro negative     Hyperlipidemia: last LDL was 54, continue statin therapy, no myalgias    History of PMR: He used to see Dr. Louanna Raw , last visit 08/2019, doing well at this time, off prednisone. States only has occasional aches here and there, but only when he is doing more physical activity, like lifting boxes  GERD: no indigestion or heartburn, taking pepcid at night. He is avoiding spicy food, he eats fried food very seldom    HTN : bp towards low end of normal, no orthostatic changes, he is on low dose ACE and today we will go down from 10 mg to 5 mg, advised him to check bp at home. No chest pain , he has palpitation very seldom and lasts seconds and not associated with sob or diaphoresis, he states episodes at  most once month, advised to make a log    Vitamin D deficiency: taking otc supplementation. Unchanged     Patient Active Problem List   Diagnosis Date Noted   Carpal tunnel syndrome of right wrist 06/26/2018   Tobacco use 02/28/2018   Osteoarthritis of right hand 02/28/2018   Chronic right shoulder pain 11/23/2016   Vitamin D insufficiency 05/27/2016   Second degree hemorrhoids    Type 2 diabetes mellitus with polyneuropathy (HCC) 11/25/2015   Hyperlipidemia 06/23/2015   Hypertension associated with diabetes (HCC) 06/23/2015   Blind left eye 06/23/2015    Past Surgical History:  Procedure Laterality Date   COLONOSCOPY     COLONOSCOPY WITH PROPOFOL N/A 03/21/2016   Procedure: COLONOSCOPY WITH PROPOFOL;  Surgeon: Midge Minium, MD;  Location: [facility];  Service: Endoscopy;  Laterality: N/A;  Diabetic - insulin   EYE SURGERY      Family History  Problem Relation Age of Onset   Hypertension Mother    Diabetes Mother    Diabetes Maternal Grandmother    Heart attack Maternal Grandfather     Social History   Socioeconomic History   Marital status: Married    Spouse name: Marverene   Number of children: 0   Years of education: Not on file   Highest education level: 12th grade  Occupational History   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   Tobacco Use   Smoking status: Some Days  Packs/day: 0.25    Years: 44.00    Pack years: 11.00    Types: Cigarettes    Start date: 11/28/1974    Last attempt to quit: 02/07/2019    Years since quitting: 2.5   Smokeless tobacco: Never   Tobacco comments:    he has been smoking a few times a week again  Vaping Use   Vaping Use: Never used  Substance and Sexual Activity   Alcohol use: Not Currently    Alcohol/week: 1.0 standard drink    Types: 1 Cans of beer per week    Comment: pt is trying to quit, has not had a drink in a month   Drug use: No   Sexual activity: Yes    Partners: Female    Birth control/protection: None   Other Topics Concern   Not on file  Social History Narrative   Married past 10 years, wife has grown children    Social Determinants of Health   Financial Resource Strain: Not on file  Food Insecurity: Not on file  Transportation Needs: Not on file  Physical Activity: Not on file  Stress: Not on file  Social Connections: Not on file  Intimate Partner Violence: Not on file     Current Outpatient Medications:    aspirin 81 MG tablet, Take 81 mg by mouth daily., Disp: , Rfl:    Cholecalciferol (VITAMIN D PO), Take 25 mg by mouth., Disp: , Rfl:    D 1000 25 MCG (1000 UT) capsule, , Disp: , Rfl:    Empagliflozin-Linaglip-Metform (TRIJARDY XR) 25-03-999 MG TB24, Take 1 tablet by mouth daily., Disp: 90 tablet, Rfl: 1   famotidine (PEPCID) 20 MG tablet, TAKE 1 TABLET BY MOUTH EVERY DAY AS NEEDED FOR HEARTBURN OR INDEGESTION, Disp: 90 tablet, Rfl: 1   gabapentin (NEURONTIN) 400 MG capsule, Take 1 capsule (400 mg total) by mouth at bedtime., Disp: 90 capsule, Rfl: 1   glucose blood test strip, Check once daily, Disp: 50 each, Rfl: 3   lisinopril (ZESTRIL) 5 MG tablet, Take 1 tablet (5 mg total) by mouth daily., Disp: 90 tablet, Rfl: 1   lovastatin (MEVACOR) 20 MG tablet, Take 1 tablet (20 mg total) by mouth at bedtime., Disp: 90 tablet, Rfl: 1   Microlet Lancets MISC, TEST BLOOD SUGAR THREE TIMES DAILY, Disp: 100 each, Rfl: 5  No Known Allergies  I personally reviewed {Reviewed:14835} with the patient/caregiver today.   ROS ***  Objective  Virtual encounter, vitals not obtained.  There is no height or weight on file to calculate BMI.  Physical Exam ***  No results found for this or any previous visit (from the past 72 hour(s)).  PHQ2/9: Depression screen [facility] 2/9 04/14/2021 12/02/2020 08/14/2020 05/13/2020 02/07/2020  Decreased Interest 0 0 0 0 0  Down, Depressed, Hopeless 0 0 0 0 0  PHQ - 2 Score 0 0 0 0 0  Altered sleeping - - - 0 0  Tired, decreased energy - - - 0 0  Change  in appetite - - - 0 0  Feeling bad or failure about yourself  - - - 0 0  Trouble concentrating - - - 0 0  Moving slowly or fidgety/restless - - - 0 0  Suicidal thoughts - - - 0 0  PHQ-9 Score - - - 0 0  Difficult doing work/chores - - - - -  Some recent data might be hidden   PHQ-2/9 Result is {gen negative/positive:315881}.    Fall Risk: Fall  Risk  04/14/2021 12/02/2020 08/14/2020 08/14/2020 05/13/2020  Falls in the past year? 0 0 0 0 0  Number falls in past yr: 0 0 0 0 0  Injury with Fall? 0 0 0 0 0   ***  Assessment & Plan  There are no diagnoses linked to this encounter.  I discussed the assessment and treatment plan with the patient. The patient was provided an opportunity to ask questions and all were answered. The patient agreed with the plan and demonstrated an understanding of the instructions.  The patient was advised to call back or seek an in-person evaluation if the symptoms worsen or if the condition fails to improve as anticipated.  I provided *** minutes of non-face-to-face time during this encounter.

## 2021-08-18 ENCOUNTER — Telehealth: Payer: 59 | Admitting: Family Medicine

## 2021-08-18 ENCOUNTER — Other Ambulatory Visit: Payer: Self-pay | Admitting: Family Medicine

## 2021-08-18 DIAGNOSIS — K219 Gastro-esophageal reflux disease without esophagitis: Secondary | ICD-10-CM

## 2021-08-18 NOTE — Telephone Encounter (Signed)
Last seen in may and has an appt for 9/27

## 2021-08-24 ENCOUNTER — Encounter: Payer: Self-pay | Admitting: Family Medicine

## 2021-08-24 ENCOUNTER — Other Ambulatory Visit: Payer: Self-pay

## 2021-08-24 ENCOUNTER — Ambulatory Visit: Payer: 59 | Admitting: Family Medicine

## 2021-08-24 VITALS — BP 126/64 | HR 76 | Temp 98.0°F | Resp 16 | Ht 64.0 in | Wt 137.0 lb

## 2021-08-24 DIAGNOSIS — Z23 Encounter for immunization: Secondary | ICD-10-CM

## 2021-08-24 DIAGNOSIS — E1142 Type 2 diabetes mellitus with diabetic polyneuropathy: Secondary | ICD-10-CM | POA: Diagnosis not present

## 2021-08-24 DIAGNOSIS — I10 Essential (primary) hypertension: Secondary | ICD-10-CM

## 2021-08-24 DIAGNOSIS — K219 Gastro-esophageal reflux disease without esophagitis: Secondary | ICD-10-CM

## 2021-08-24 DIAGNOSIS — E785 Hyperlipidemia, unspecified: Secondary | ICD-10-CM | POA: Diagnosis not present

## 2021-08-24 MED ORDER — LISINOPRIL 5 MG PO TABS: 5.0000 mg | ORAL_TABLET | Freq: Every day | ORAL | 1 refills | Status: DC

## 2021-08-24 MED ORDER — FAMOTIDINE 20 MG PO TABS: 20.0000 mg | ORAL_TABLET | Freq: Every day | ORAL | 1 refills | Status: DC

## 2021-08-24 MED ORDER — GLUCOSE BLOOD VI STRP: ORAL_STRIP | 3 refills | Status: DC

## 2021-08-24 MED ORDER — LOVASTATIN 20 MG PO TABS: 20.0000 mg | ORAL_TABLET | Freq: Every day | ORAL | 1 refills | Status: DC

## 2021-08-24 MED ORDER — SHINGRIX 50 MCG/0.5ML IM SUSR: 0.5000 mL | Freq: Once | INTRAMUSCULAR | 1 refills | Status: AC

## 2021-08-24 MED ORDER — TRIJARDY XR 25-5-1000 MG PO TB24: 1.0000 | ORAL_TABLET | Freq: Every day | ORAL | 1 refills | Status: DC

## 2021-08-24 NOTE — Addendum Note (Signed)
Addended by: Alba Cory F on: 08/24/2021 03:23 PM   Modules accepted: Orders

## 2021-08-24 NOTE — Progress Notes (Signed)
Name: John Howard   MRN: [id number]    DOB: September 22, 1956   Date:08/24/2021       Progress Note  Subjective  Chief Complaint  Follow Up  HPI  DMII: he is complaints with medication, only taking one pill, Empagliforzin-Linaglip-metformin 25-03-999 daily . He denies hypoglycemic episodes. No polyphagia, polydipsia or polyuria.. FSBS at home lowest lately was 85-126 . A1C went down from 7 % to 6.8% last visit 6.9% . He has neuropathy on both legs , but pain is controlled with gabapentin 400 mg prn at night only. He also has dyslipidemia and is taking statin therapy  urine micro negative back in January 2022     Hyperlipidemia: last LDL was 54, continue statin therapy, no myalgias . Continue medication    History of PMR: He used to see Rheumatologist , last visit 08/2019, doing well at this time, off prednisone. Doing well   GERD: no indigestion or heartburn, taking pepcid at night., he has been out of medications and is doing well as long as he follows a GERD diet. He will try to wean self off    HTN : bp is at goal, taking lower dose of ACE - lisinopril 5 mg, No chest pain , he has palpitation very seldom and lasts seconds and not associated with sob or diaphoresis. Denies dizziness    Vitamin D deficiency: taking otc supplementation. Unchanged    Patient Active Problem List   Diagnosis Date Noted   Carpal tunnel syndrome of right wrist 06/26/2018   Tobacco use 02/28/2018   Osteoarthritis of right hand 02/28/2018   Chronic right shoulder pain 11/23/2016   Vitamin D insufficiency 05/27/2016   Second degree hemorrhoids    Type 2 diabetes mellitus with polyneuropathy (HCC) 11/25/2015   Hyperlipidemia 06/23/2015   Hypertension associated with diabetes (HCC) 06/23/2015   Blind left eye 06/23/2015    Past Surgical History:  Procedure Laterality Date   COLONOSCOPY     COLONOSCOPY WITH PROPOFOL N/A 03/21/2016   Procedure: COLONOSCOPY WITH PROPOFOL;  Surgeon: Midge Minium, MD;  Location:  [facility];  Service: Endoscopy;  Laterality: N/A;  Diabetic - insulin   EYE SURGERY      Family History  Problem Relation Age of Onset   Hypertension Mother    Diabetes Mother    Diabetes Maternal Grandmother    Heart attack Maternal Grandfather     Social History   Tobacco Use   Smoking status: Some Days    Packs/day: 0.25    Years: 44.00    Pack years: 11.00    Types: Cigarettes    Start date: 11/28/1974    Last attempt to quit: 02/07/2019    Years since quitting: 2.5   Smokeless tobacco: Never   Tobacco comments:    he has been smoking a few times a week again  Substance Use Topics   Alcohol use: Not Currently    Alcohol/week: 1.0 standard drink    Types: 1 Cans of beer per week    Comment: pt is trying to quit, has not had a drink in a month     Current Outpatient Medications:    aspirin 81 MG tablet, Take 81 mg by mouth daily., Disp: , Rfl:    Cholecalciferol (VITAMIN D PO), Take 25 mg by mouth., Disp: , Rfl:    D 1000 25 MCG (1000 UT) capsule, , Disp: , Rfl:    Empagliflozin-Linaglip-Metform (TRIJARDY XR) 25-03-999 MG TB24, Take 1 tablet by mouth daily., Disp:  90 tablet, Rfl: 1   famotidine (PEPCID) 20 MG tablet, TAKE 1 TABLET BY MOUTH EVERY DAY AS NEEDED FOR HEARTBURN OR INDEGESTION, Disp: 30 tablet, Rfl: 0   gabapentin (NEURONTIN) 400 MG capsule, Take 1 capsule (400 mg total) by mouth at bedtime., Disp: 90 capsule, Rfl: 1   glucose blood test strip, Check once daily, Disp: 50 each, Rfl: 3   lisinopril (ZESTRIL) 5 MG tablet, Take 1 tablet (5 mg total) by mouth daily., Disp: 90 tablet, Rfl: 1   lovastatin (MEVACOR) 20 MG tablet, Take 1 tablet (20 mg total) by mouth at bedtime., Disp: 90 tablet, Rfl: 1   Microlet Lancets MISC, TEST BLOOD SUGAR THREE TIMES DAILY, Disp: 100 each, Rfl: 5  No Known Allergies  I personally reviewed active problem list, medication list, allergies, family history, social history, health maintenance with the patient/caregiver  today.   ROS  Constitutional: Negative for fever or weight change.  Respiratory: Negative for cough and shortness of breath.   Cardiovascular: Negative for chest pain or palpitations.  Gastrointestinal: Negative for abdominal pain, no bowel changes.  Musculoskeletal: Negative for gait problem or joint swelling.  Skin: Negative for rash.  Neurological: Negative for dizziness or headache.  No other specific complaints in a complete review of systems (except as listed in HPI above).   Objective  Vitals:   08/24/21 1450  BP: 126/64  Pulse: 76  Resp: 16  Temp: 98 F (36.7 C)  SpO2: 98%  Weight: 137 lb (62.1 kg)  Height: 5\' 4"  (1.626 m)    Body mass index is 23.52 kg/m.  Physical Exam  Constitutional: Patient appears well-developed and well-nourished.  No distress.  HEENT: head atraumatic, normocephalic, pupils equal and reactive to light, neck supple, Cardiovascular: Normal rate, regular rhythm and normal heart sounds.  No murmur heard. No BLE edema. Pulmonary/Chest: Effort normal and breath sounds normal. No respiratory distress. Abdominal: Soft.  There is no tenderness. Psychiatric: Patient has a normal mood and affect. behavior is normal. Judgment and thought content normal.    PHQ2/9: Depression screen [facility] 2/9 08/24/2021 04/14/2021 12/02/2020 08/14/2020 05/13/2020  Decreased Interest 0 0 0 0 0  Down, Depressed, Hopeless 0 0 0 0 0  PHQ - 2 Score 0 0 0 0 0  Altered sleeping - - - - 0  Tired, decreased energy - - - - 0  Change in appetite - - - - 0  Feeling bad or failure about yourself  - - - - 0  Trouble concentrating - - - - 0  Moving slowly or fidgety/restless - - - - 0  Suicidal thoughts - - - - 0  PHQ-9 Score - - - - 0  Difficult doing work/chores - - - - -  Some recent data might be hidden    phq 9 is negative   Fall Risk: Fall Risk  08/24/2021 04/14/2021 12/02/2020 08/14/2020 08/14/2020  Falls in the past year? 0 0 0 0 0  Number falls in past yr: 0 0 0 0 0   Injury with Fall? 0 0 0 0 0  Risk for fall due to : No Fall Risks - - - -  Follow up Falls prevention discussed - - - -      Functional Status Survey: Is the patient deaf or have difficulty hearing?: No Does the patient have difficulty seeing, even when wearing glasses/contacts?: No Does the patient have difficulty concentrating, remembering, or making decisions?: No Does the patient have difficulty walking or climbing stairs?:  No Does the patient have difficulty dressing or bathing?: No Does the patient have difficulty doing errands alone such as visiting a doctor's office or shopping?: No    Assessment & Plan  1. Type 2 diabetes mellitus with polyneuropathy (HCC)  - HgB A1c - glucose blood test strip; Check once daily  Dispense: 100 each; Refill: 3 - Empagliflozin-Linaglip-Metform (TRIJARDY XR) 25-03-999 MG TB24; Take 1 tablet by mouth daily.  Dispense: 90 tablet; Refill: 1 - lovastatin (MEVACOR) 20 MG tablet; Take 1 tablet (20 mg total) by mouth at bedtime.  Dispense: 90 tablet; Refill: 1  2. Need for immunization against influenza  - Flu Vaccine QUAD 61mo+IM (Fluarix, Fluzone & Alfiuria Quad PF)  3. Essential hypertension  - lisinopril (ZESTRIL) 5 MG tablet; Take 1 tablet (5 mg total) by mouth daily.  Dispense: 90 tablet; Refill: 1  4. Dyslipidemia  - lovastatin (MEVACOR) 20 MG tablet; Take 1 tablet (20 mg total) by mouth at bedtime.  Dispense: 90 tablet; Refill: 1  5. GERD without esophagitis  - famotidine (PEPCID) 20 MG tablet; Take 1 tablet (20 mg total) by mouth at bedtime.  Dispense: 90 tablet; Refill: 1

## 2021-08-25 LAB — HEMOGLOBIN A1C
Hgb A1c MFr Bld: 6.9 % of total Hgb — ABNORMAL HIGH (ref <5.7)
Mean Plasma Glucose: 151 mg/dL
eAG (mmol/L): 8.4 mmol/L

## 2021-08-31 ENCOUNTER — Telehealth: Payer: Self-pay

## 2021-08-31 NOTE — Telephone Encounter (Signed)
Pt aware A1c was 6.9%

## 2021-08-31 NOTE — Telephone Encounter (Signed)
Pt is checking on A1C lab results. Please give him a call [phone]

## 2021-09-07 ENCOUNTER — Other Ambulatory Visit: Payer: Self-pay | Admitting: Family Medicine

## 2021-09-07 DIAGNOSIS — E1142 Type 2 diabetes mellitus with diabetic polyneuropathy: Secondary | ICD-10-CM

## 2021-09-13 ENCOUNTER — Other Ambulatory Visit: Payer: Self-pay | Admitting: Family Medicine

## 2021-09-13 ENCOUNTER — Telehealth: Payer: Self-pay

## 2021-09-13 NOTE — Telephone Encounter (Signed)
PA Submitted today. I don't see any samples to give.  Copied from CRM [id number]. Topic: General - Other >> Sep 13, 2021  2:20 PM Jaquita Rector A wrote: Reason for CRM: Patient called in to inform Dr Carlynn Purl that he is still waiting to get his medication TRIJARDY XR 25-03-999 MG TB24  say that he was told it need a PA and he is completely out of medications and need to know if there are any samples of TRIJARDY XR 25-03-999 MG TB24 please call patient at  Ph# [phone]

## 2021-09-14 NOTE — Telephone Encounter (Signed)
PA approved.  Pt notified.

## 2021-11-24 ENCOUNTER — Other Ambulatory Visit: Payer: Self-pay | Admitting: Family Medicine

## 2021-11-24 DIAGNOSIS — E1142 Type 2 diabetes mellitus with diabetic polyneuropathy: Secondary | ICD-10-CM

## 2021-11-24 NOTE — Telephone Encounter (Signed)
Duplicate request

## 2021-12-23 NOTE — Progress Notes (Signed)
Name: John Howard   MRN: [id number]    DOB: 09/07/1956   Date:12/24/2021       Progress Note  Subjective  Chief Complaint  Follow Up  HPI  DMII: he is complaints with medication, only taking one pill, Empagliforzin-Linaglip-metformin 25-03-999 daily . He denies hypoglycemic episodes. No polyphagia, polydipsia or polyuria.. FSBS at home has been between 108-133, usually in the 1 teens.  A1C went down from 7 % to 6.8% ,6.9% today is 6.9 % . He has neuropathy on both legs , but he has been doing well and stopped taking Gabapentin and symptoms are controlled  He also has dyslipidemia and is taking statin therapy  urine micro negative back in January 2022 and we will recheck it today. Advised to keep yearly eye exam, he states has an appointment Feb 2023 . He has noticed a corn on right 5 th toe for the past couple of months it hurts depending on the shoes he wears.     Hyperlipidemia: last LDL was 54, continue statin therapy, no myalgias . Continue medication . We will recheck labs today    History of PMR: He used to see Rheumatologist , last visit 08/2019, doing well at this time, off prednisone. Still no problems   GERD: he stopped taking Pepcid and is doing well, discussed may use prn only.   HTN : bp is  towards low end of normal, but usually in the 120's/80's range and asymptomatic. Advised to monitor bp at home and let me know if remains low. He is  on lisinopril 5 mg, No chest pain , he has palpitation very seldom and lasts seconds and not associated with sob or diaphoresis.    Vitamin D deficiency: taking otc supplementation, we will recheck labs   Carpal Tunnels syndrome right: he has noticed increase in symptoms when he stopped gabapentin, advised to resume medication. He denies weakness   Patient Active Problem List   Diagnosis Date Noted   Carpal tunnel syndrome of right wrist 06/26/2018   Tobacco use 02/28/2018   Osteoarthritis of right hand 02/28/2018   Chronic right  shoulder pain 11/23/2016   Vitamin D insufficiency 05/27/2016   Second degree hemorrhoids    Type 2 diabetes mellitus with polyneuropathy (HCC) 11/25/2015   Hyperlipidemia 06/23/2015   Hypertension associated with diabetes (HCC) 06/23/2015   Blind left eye 06/23/2015    Past Surgical History:  Procedure Laterality Date   COLONOSCOPY     COLONOSCOPY WITH PROPOFOL N/A 03/21/2016   Procedure: COLONOSCOPY WITH PROPOFOL;  Surgeon: Midge Minium, MD;  Location: [facility];  Service: Endoscopy;  Laterality: N/A;  Diabetic - insulin   EYE SURGERY      Family History  Problem Relation Age of Onset   Hypertension Mother    Diabetes Mother    Diabetes Maternal Grandmother    Heart attack Maternal Grandfather     Social History   Tobacco Use   Smoking status: Some Days    Packs/day: 0.25    Years: 44.00    Pack years: 11.00    Types: Cigarettes    Start date: 11/28/1974    Last attempt to quit: 02/07/2019    Years since quitting: 2.8   Smokeless tobacco: Never   Tobacco comments:    he has been smoking a few times a week again  Substance Use Topics   Alcohol use: Not Currently    Alcohol/week: 1.0 standard drink    Types: 1 Cans of beer per  week    Comment: pt is trying to quit, has not had a drink in a month     Current Outpatient Medications:    aspirin 81 MG tablet, Take 81 mg by mouth daily., Disp: , Rfl:    Cholecalciferol (VITAMIN D PO), Take 25 mg by mouth., Disp: , Rfl:    D 1000 25 MCG (1000 UT) capsule, , Disp: , Rfl:    glucose blood test strip, Check once daily, Disp: 100 each, Rfl: 3   lisinopril (ZESTRIL) 5 MG tablet, Take 1 tablet (5 mg total) by mouth daily., Disp: 90 tablet, Rfl: 1   lovastatin (MEVACOR) 20 MG tablet, Take 1 tablet (20 mg total) by mouth at bedtime., Disp: 90 tablet, Rfl: 1   Microlet Lancets MISC, TEST BLOOD SUGAR THREE TIMES DAILY, Disp: 100 each, Rfl: 5   TRIJARDY XR 25-03-999 MG TB24, TAKE 1 TABLET BY MOUTH DAILY, Disp: 30 tablet,  Rfl: 0   famotidine (PEPCID) 20 MG tablet, Take 1 tablet (20 mg total) by mouth at bedtime. (Patient not taking: Reported on 12/24/2021), Disp: 90 tablet, Rfl: 1   gabapentin (NEURONTIN) 400 MG capsule, Take 1 capsule (400 mg total) by mouth at bedtime. (Patient not taking: Reported on 12/24/2021), Disp: 90 capsule, Rfl: 1  No Known Allergies  I personally reviewed active problem list, medication list, allergies, family history, social history, health maintenance with the patient/caregiver today.   ROS  Constitutional: Negative for fever or weight change.  Respiratory: Negative for cough and shortness of breath.   Cardiovascular: Negative for chest pain or palpitations.  Gastrointestinal: Negative for abdominal pain, no bowel changes.  Musculoskeletal: Negative for gait problem or joint swelling.  Skin: Negative for rash.  Neurological: Negative for dizziness or headache.  No other specific complaints in a complete review of systems (except as listed in HPI above).   Objective  Vitals:   12/24/21 0819  BP: 118/60  Pulse: 91  Resp: 16  Temp: 97.9 F (36.6 C)  SpO2: 98%  Weight: 137 lb (62.1 kg)  Height: 5\' 4"  (1.626 m)    Body mass index is 23.52 kg/m.  Physical Exam  Constitutional: Patient appears well-developed and well-nourished.No distress.  HEENT: head atraumatic, normocephalic,  neck supple Cardiovascular: Normal rate, regular rhythm and normal heart sounds.  No murmur heard. No BLE edema. Pulmonary/Chest: Effort normal and breath sounds normal. No respiratory distress. Abdominal: Soft.  There is no tenderness. Psychiatric: Patient has a normal mood and affect. behavior is normal. Judgment and thought content normal.  Foot: small corn formation, some callus formation on foot, he may use topical pads since not wanting to go to podiatrist, advised not to trim or cut it   Recent Results (from the past 2160 hour(s))  POCT HgB A1C     Status: Abnormal   Collection  Time: 12/24/21  8:23 AM  Result Value Ref Range   Hemoglobin A1C 6.9 (A) 4.0 - 5.6 %   HbA1c POC (<> result, manual entry)     HbA1c, POC (prediabetic range)     HbA1c, POC (controlled diabetic range)       PHQ2/9: Depression screen [facility] 2/9 12/24/2021 08/24/2021 04/14/2021 12/02/2020 08/14/2020  Decreased Interest 0 0 0 0 0  Down, Depressed, Hopeless 0 0 0 0 0  PHQ - 2 Score 0 0 0 0 0  Altered sleeping 0 - - - -  Tired, decreased energy 0 - - - -  Change in appetite 0 - - - -  Feeling bad or failure about yourself  0 - - - -  Trouble concentrating 0 - - - -  Moving slowly or fidgety/restless 0 - - - -  Suicidal thoughts 0 - - - -  PHQ-9 Score 0 - - - -  Difficult doing work/chores - - - - -  Some recent data might be hidden    phq 9 is negative   Fall Risk: Fall Risk  12/24/2021 08/24/2021 04/14/2021 12/02/2020 08/14/2020  Falls in the past year? 0 0 0 0 0  Number falls in past yr: 0 0 0 0 0  Injury with Fall? 0 0 0 0 0  Risk for fall due to : No Fall Risks No Fall Risks - - -  Follow up Falls prevention discussed Falls prevention discussed - - -      Functional Status Survey: Is the patient deaf or have difficulty hearing?: No Does the patient have difficulty seeing, even when wearing glasses/contacts?: No Does the patient have difficulty concentrating, remembering, or making decisions?: No Does the patient have difficulty walking or climbing stairs?: No Does the patient have difficulty dressing or bathing?: No Does the patient have difficulty doing errands alone such as visiting a doctor's office or shopping?: No    Assessment & Plan   1. Type 2 diabetes mellitus with polyneuropathy (HCC)  - POCT HgB A1C - lovastatin (MEVACOR) 20 MG tablet; Take 1 tablet (20 mg total) by mouth at bedtime.  Dispense: 90 tablet; Refill: 1 - Empagliflozin-Linaglip-Metform (TRIJARDY XR) 25-03-999 MG TB24; Take 1 tablet by mouth daily.  Dispense: 90 tablet; Refill: 1  2. Hypertension  associated with diabetes (HCC)  - Microalbumin / creatinine urine ratio - CBC with Differential/Platelet - COMPLETE METABOLIC PANEL WITH GFR  3. GERD without esophagitis   4. Essential hypertension  - lisinopril (ZESTRIL) 5 MG tablet; Take 1 tablet (5 mg total) by mouth daily.  Dispense: 90 tablet; Refill: 1  5. Dyslipidemia  - Lipid panel - lovastatin (MEVACOR) 20 MG tablet; Take 1 tablet (20 mg total) by mouth at bedtime.  Dispense: 90 tablet; Refill: 1  6. Vitamin D deficiency  - Vitamin D (25 hydroxy)  7. Need for shingles vaccine  - Zoster Vaccine Adjuvanted [employer](SHINGRIX) injection; Inject 0.5 mLs into the muscle once for 1 dose.  Dispense: 0.5 mL; Refill: 0  8. Carpal tunnel syndrome of right wrist

## 2021-12-24 ENCOUNTER — Ambulatory Visit: Payer: 59 | Admitting: Family Medicine

## 2021-12-24 ENCOUNTER — Encounter: Payer: Self-pay | Admitting: Family Medicine

## 2021-12-24 VITALS — BP 118/60 | HR 91 | Temp 97.9°F | Resp 16 | Ht 64.0 in | Wt 137.0 lb

## 2021-12-24 DIAGNOSIS — G5601 Carpal tunnel syndrome, right upper limb: Secondary | ICD-10-CM

## 2021-12-24 DIAGNOSIS — E1159 Type 2 diabetes mellitus with other circulatory complications: Secondary | ICD-10-CM | POA: Diagnosis not present

## 2021-12-24 DIAGNOSIS — I152 Hypertension secondary to endocrine disorders: Secondary | ICD-10-CM

## 2021-12-24 DIAGNOSIS — Z23 Encounter for immunization: Secondary | ICD-10-CM

## 2021-12-24 DIAGNOSIS — E559 Vitamin D deficiency, unspecified: Secondary | ICD-10-CM | POA: Diagnosis not present

## 2021-12-24 DIAGNOSIS — E1142 Type 2 diabetes mellitus with diabetic polyneuropathy: Secondary | ICD-10-CM

## 2021-12-24 DIAGNOSIS — I10 Essential (primary) hypertension: Secondary | ICD-10-CM

## 2021-12-24 DIAGNOSIS — E785 Hyperlipidemia, unspecified: Secondary | ICD-10-CM | POA: Diagnosis not present

## 2021-12-24 DIAGNOSIS — K219 Gastro-esophageal reflux disease without esophagitis: Secondary | ICD-10-CM | POA: Diagnosis not present

## 2021-12-24 LAB — POCT GLYCOSYLATED HEMOGLOBIN (HGB A1C): Hemoglobin A1C: 6.9 % — AB (ref 4.0–5.6)

## 2021-12-24 MED ORDER — LISINOPRIL 5 MG PO TABS: 5.0000 mg | ORAL_TABLET | Freq: Every day | ORAL | 1 refills | Status: DC

## 2021-12-24 MED ORDER — TRIJARDY XR 25-5-1000 MG PO TB24: 1.0000 | ORAL_TABLET | Freq: Every day | ORAL | 1 refills | Status: DC

## 2021-12-24 MED ORDER — LOVASTATIN 20 MG PO TABS: 20.0000 mg | ORAL_TABLET | Freq: Every day | ORAL | 1 refills | Status: DC

## 2021-12-24 MED ORDER — GABAPENTIN 400 MG PO CAPS: 400.0000 mg | ORAL_CAPSULE | Freq: Every day | ORAL | 1 refills | Status: DC

## 2021-12-24 MED ORDER — SHINGRIX 50 MCG/0.5ML IM SUSR: 0.5000 mL | Freq: Once | INTRAMUSCULAR | 0 refills | Status: AC

## 2021-12-25 LAB — COMPLETE METABOLIC PANEL WITH GFR
AG Ratio: 1.6 (calc) (ref 1.0–2.5)
ALT: 14 U/L (ref 9–46)
AST: 14 U/L (ref 10–35)
Albumin: 4.5 g/dL (ref 3.6–5.1)
Alkaline phosphatase (APISO): 58 U/L (ref 35–144)
BUN: 22 mg/dL (ref 7–25)
CO2: 29 mmol/L (ref 20–32)
Calcium: 10.1 mg/dL (ref 8.6–10.3)
Chloride: 102 mmol/L (ref 98–110)
Creat: 1.08 mg/dL (ref 0.70–1.35)
Globulin: 2.8 g/dL (calc) (ref 1.9–3.7)
Glucose, Bld: 113 mg/dL — ABNORMAL HIGH (ref 65–99)
Potassium: 4.7 mmol/L (ref 3.5–5.3)
Sodium: 136 mmol/L (ref 135–146)
Total Bilirubin: 0.3 mg/dL (ref 0.2–1.2)
Total Protein: 7.3 g/dL (ref 6.1–8.1)
eGFR: 76 mL/min/{1.73_m2} (ref >=60)

## 2021-12-25 LAB — VITAMIN D 25 HYDROXY (VIT D DEFICIENCY, FRACTURES): Vit D, 25-Hydroxy: 59 ng/mL (ref 30–100)

## 2021-12-25 LAB — CBC WITH DIFFERENTIAL/PLATELET
Absolute Monocytes: 672 cells/uL (ref 200–950)
Basophils Absolute: 18 cells/uL (ref 0–200)
Basophils Relative: 0.4 %
Eosinophils Absolute: 110 cells/uL (ref 15–500)
Eosinophils Relative: 2.4 %
HCT: 47.9 % (ref 38.5–50.0)
Hemoglobin: 16.1 g/dL (ref 13.2–17.1)
Lymphs Abs: 1444 cells/uL (ref 850–3900)
MCH: 30.6 pg (ref 27.0–33.0)
MCHC: 33.6 g/dL (ref 32.0–36.0)
MCV: 90.9 fL (ref 80.0–100.0)
MPV: 10.7 fL (ref 7.5–12.5)
Monocytes Relative: 14.6 %
Neutro Abs: 2355 cells/uL (ref 1500–7800)
Neutrophils Relative %: 51.2 %
Platelets: 240 10*3/uL (ref 140–400)
RBC: 5.27 10*6/uL (ref 4.20–5.80)
RDW: 12.7 % (ref 11.0–15.0)
Total Lymphocyte: 31.4 %
WBC: 4.6 10*3/uL (ref 3.8–10.8)

## 2021-12-25 LAB — LIPID PANEL
Cholesterol: 136 mg/dL (ref <200)
HDL: 60 mg/dL (ref >=40)
LDL Cholesterol (Calc): 62 mg/dL (calc)
Non-HDL Cholesterol (Calc): 76 mg/dL (calc) (ref <130)
Total CHOL/HDL Ratio: 2.3 (calc) (ref <5.0)
Triglycerides: 62 mg/dL (ref <150)

## 2021-12-25 LAB — MICROALBUMIN / CREATININE URINE RATIO
Creatinine, Urine: 34 mg/dL (ref 20–320)
Microalb, Ur: <0.2 mg/dL

## 2022-01-03 DIAGNOSIS — H40053 Ocular hypertension, bilateral: Secondary | ICD-10-CM | POA: Diagnosis not present

## 2022-01-03 LAB — HM DIABETES EYE EXAM

## 2022-04-04 ENCOUNTER — Other Ambulatory Visit: Payer: Self-pay | Admitting: Family Medicine

## 2022-04-04 DIAGNOSIS — K219 Gastro-esophageal reflux disease without esophagitis: Secondary | ICD-10-CM

## 2022-05-11 NOTE — Progress Notes (Signed)
Name: John Howard   MRN: [id number]    DOB: 05/19/1956   Date:05/12/2022       Progress Note  Subjective  Chief Complaint  Follow Up  HPI  DMII: he is complaints with medication, he takes  Empagliforzin-Linaglip-metformin 25-03-999 daily . He denies hypoglycemic episodes. No polyphagia, polydipsia or polyuria.. FSBS at home has been between 98-110  A1C went down from 7 % to 6.8% ,6.9% ,6.9 % but today up to 7.5 %, he states he is eating more cookies lately and will resume a diabetic diet  . He has neuropathy on both legs  he is back on gabapentin at night He also has dyslipidemia and is taking statin therapy  urine micro negative .     Hyperlipidemia: last LDL was 62, continue statin therapy, no myalgias . Continue medication .    History of PMR: He used to see Rheumatologist , last visit 08/2019, doing well at this time, off prednisone.Unchanged   GERD: he stopped taking Pepcid and is doing well, he only has symptoms when he east spaghetti .   HTN : bp is  towards low end of normal, and lower than last time, we will decrease dose of lisinopril to 2.5 mg and monitor. No chest pain or palpitation. Denies orthostatic changes    Vitamin D deficiency: taking otc supplementation, last level was at goal   Carpal Tunnels syndrome right: he is taking gabapentin again at night and seems to help a little.    Patient Active Problem List   Diagnosis Date Noted   Intermittent low back pain 05/12/2022   GERD without esophagitis 05/12/2022   Carpal tunnel syndrome of right wrist 06/26/2018   Tobacco use 02/28/2018   Osteoarthritis of right hand 02/28/2018   Chronic right shoulder pain 11/23/2016   Vitamin D insufficiency 05/27/2016   Second degree hemorrhoids    Type 2 diabetes mellitus with polyneuropathy (HCC) 11/25/2015   Dyslipidemia 06/23/2015   Essential hypertension 06/23/2015   Blind left eye 06/23/2015    Past Surgical History:  Procedure Laterality Date   COLONOSCOPY      COLONOSCOPY WITH PROPOFOL N/A 03/21/2016   Procedure: COLONOSCOPY WITH PROPOFOL;  Surgeon: Midge Miniumarren Wohl, MD;  [facility];  Service: Endoscopy;  Laterality: N/A;  Diabetic - insulin   EYE SURGERY      Family History  Problem Relation Age of Onset   Hypertension Mother    Diabetes Mother    Diabetes Maternal Grandmother    Heart attack Maternal Grandfather     Social History   Tobacco Use   Smoking status: Some Days    Packs/day: 0.25    Years: 44.00    Total pack years: 11.00    Types: Cigarettes    Start date: 11/28/1974    Last attempt to quit: 02/07/2019    Years since quitting: 3.2   Smokeless tobacco: Never   Tobacco comments:    he has been smoking a few times a week again  Substance Use Topics   Alcohol use: Not Currently    Alcohol/week: 1.0 standard drink of alcohol    Types: 1 Cans of beer per week    Comment: pt is trying to quit, has not had a drink in a month     Current Outpatient Medications:    aspirin 81 MG tablet, Take 81 mg by mouth daily., Disp: , Rfl:    Cholecalciferol (VITAMIN D PO), Take 25 mg by mouth., Disp: , Rfl:  D 1000 25 MCG (1000 UT) capsule, , Disp: , Rfl:    glucose blood test strip, Check once daily, Disp: 100 each, Rfl: 3   Microlet Lancets MISC, TEST BLOOD SUGAR THREE TIMES DAILY, Disp: 100 each, Rfl: 5   Empagliflozin-Linaglip-Metform (TRIJARDY XR) 25-03-999 MG TB24, Take 1 tablet by mouth daily., Disp: 90 tablet, Rfl: 1   gabapentin (NEURONTIN) 400 MG capsule, Take 1 capsule (400 mg total) by mouth at bedtime., Disp: 90 capsule, Rfl: 1   lisinopril (ZESTRIL) 2.5 MG tablet, Take 1 tablet (2.5 mg total) by mouth daily., Disp: 90 tablet, Rfl: 1   lovastatin (MEVACOR) 20 MG tablet, Take 1 tablet (20 mg total) by mouth at bedtime., Disp: 90 tablet, Rfl: 1  No Known Allergies  I personally reviewed active problem list, medication list, allergies, family history, social history, health maintenance with the  patient/caregiver today.   ROS  Constitutional: Negative for fever or weight change.  Respiratory: Negative for cough and shortness of breath.   Cardiovascular: Negative for chest pain or palpitations.  Gastrointestinal: Negative for abdominal pain, no bowel changes.  Musculoskeletal: Negative for gait problem or joint swelling.  Skin: Negative for rash.  Neurological: Negative for dizziness or headache.  No other specific complaints in a complete review of systems (except as listed in HPI above).   Objective  Vitals:   05/12/22 0759  BP: 104/62  Pulse: 81  Resp: 16  SpO2: 98%  Weight: 135 lb (61.2 kg)  Height: 5\' 5"  (1.651 m)    Body mass index is 22.47 kg/m.  Physical Exam  Constitutional: Patient appears well-developed and well-nourished.  No distress.  HEENT: head atraumatic, normocephalic, strabismus and blind from left eye neck supple Cardiovascular: Normal rate, regular rhythm and normal heart sounds.  No murmur heard. No BLE edema. Pulmonary/Chest: Effort normal and breath sounds normal. No respiratory distress. Abdominal: Soft.  There is no tenderness. Psychiatric: Patient has a normal mood and affect. behavior is normal. Judgment and thought content normal.   Diabetic Foot Exam: Diabetic Foot Exam - Simple   Simple Foot Form Visual Inspection See comments: Yes Sensation Testing Intact to touch and monofilament testing bilaterally: Yes Pulse Check Posterior Tibialis and Dorsalis pulse intact bilaterally: Yes Comments Corn on right 5 th toe      PHQ2/9:    05/12/2022    7:59 AM 12/24/2021    8:20 AM 08/24/2021    2:49 PM 04/14/2021    8:23 AM 12/02/2020    9:55 AM  Depression screen PHQ 2/9  Decreased Interest 0 0 0 0 0  Down, Depressed, Hopeless 0 0 0 0 0  PHQ - 2 Score 0 0 0 0 0  Altered sleeping 0 0     Tired, decreased energy 0 0     Change in appetite 0 0     Feeling bad or failure about yourself  0 0     Trouble concentrating 0 0      Moving slowly or fidgety/restless 0 0     Suicidal thoughts 0 0     PHQ-9 Score 0 0       phq 9 is negative   Fall Risk:    05/12/2022    7:59 AM 12/24/2021    8:20 AM 08/24/2021    2:49 PM 04/14/2021    8:23 AM 12/02/2020    9:55 AM  Fall Risk   Falls in the past year? 0 0 0 0 0  Number falls in past  yr: 0 0 0 0 0  Injury with Fall? 0 0 0 0 0  Risk for fall due to : No Fall Risks No Fall Risks No Fall Risks    Follow up Falls prevention discussed Falls prevention discussed Falls prevention discussed        Functional Status Survey: Is the patient deaf or have difficulty hearing?: No Does the patient have difficulty seeing, even when wearing glasses/contacts?: No Does the patient have difficulty concentrating, remembering, or making decisions?: No Does the patient have difficulty walking or climbing stairs?: No Does the patient have difficulty dressing or bathing?: No Does the patient have difficulty doing errands alone such as visiting a doctor's office or shopping?: No    Assessment & Plan  1. Type 2 diabetes mellitus with polyneuropathy (HCC)  - POCT HgB A1C - HM Diabetes Foot Exam - lovastatin (MEVACOR) 20 MG tablet; Take 1 tablet (20 mg total) by mouth at bedtime.  Dispense: 90 tablet; Refill: 1 - gabapentin (NEURONTIN) 400 MG capsule; Take 1 capsule (400 mg total) by mouth at bedtime.  Dispense: 90 capsule; Refill: 1 - Empagliflozin-Linaglip-Metform (TRIJARDY XR) 25-03-999 MG TB24; Take 1 tablet by mouth daily.  Dispense: 90 tablet; Refill: 1  2. Essential hypertension  - lisinopril (ZESTRIL) 2.5 MG tablet; Take 1 tablet (2.5 mg total) by mouth daily.  Dispense: 90 tablet; Refill: 1  3. Dyslipidemia  - lovastatin (MEVACOR) 20 MG tablet; Take 1 tablet (20 mg total) by mouth at bedtime.  Dispense: 90 tablet; Refill: 1  4. Carpal tunnel syndrome of right wrist  - gabapentin (NEURONTIN) 400 MG capsule; Take 1 capsule (400 mg total) by mouth at bedtime.  Dispense:  90 capsule; Refill: 1  5. Vitamin D insufficiency  Continue supplementation   6. Intermittent low back pain  States he lift boxes at work, mild and intermittent, feels stiff at times  7. GERD without esophagitis   Doing well at this time

## 2022-05-12 ENCOUNTER — Encounter: Payer: Self-pay | Admitting: Family Medicine

## 2022-05-12 ENCOUNTER — Ambulatory Visit: Payer: 59 | Admitting: Family Medicine

## 2022-05-12 VITALS — BP 104/62 | HR 81 | Resp 16 | Ht 65.0 in | Wt 135.0 lb

## 2022-05-12 DIAGNOSIS — G5601 Carpal tunnel syndrome, right upper limb: Secondary | ICD-10-CM

## 2022-05-12 DIAGNOSIS — K219 Gastro-esophageal reflux disease without esophagitis: Secondary | ICD-10-CM

## 2022-05-12 DIAGNOSIS — E559 Vitamin D deficiency, unspecified: Secondary | ICD-10-CM

## 2022-05-12 DIAGNOSIS — M545 Low back pain, unspecified: Secondary | ICD-10-CM | POA: Diagnosis not present

## 2022-05-12 DIAGNOSIS — I10 Essential (primary) hypertension: Secondary | ICD-10-CM

## 2022-05-12 DIAGNOSIS — E1142 Type 2 diabetes mellitus with diabetic polyneuropathy: Secondary | ICD-10-CM

## 2022-05-12 DIAGNOSIS — E785 Hyperlipidemia, unspecified: Secondary | ICD-10-CM

## 2022-05-12 LAB — POCT GLYCOSYLATED HEMOGLOBIN (HGB A1C): Hemoglobin A1C: 7.5 % — AB (ref 4.0–5.6)

## 2022-05-12 MED ORDER — TRIJARDY XR 25-5-1000 MG PO TB24: 1.0000 | ORAL_TABLET | Freq: Every day | ORAL | 1 refills | Status: DC

## 2022-05-12 MED ORDER — LOVASTATIN 20 MG PO TABS: 20.0000 mg | ORAL_TABLET | Freq: Every day | ORAL | 1 refills | Status: DC

## 2022-05-12 MED ORDER — LISINOPRIL 2.5 MG PO TABS: 2.5000 mg | ORAL_TABLET | Freq: Every day | ORAL | 1 refills | Status: DC

## 2022-05-12 MED ORDER — GABAPENTIN 400 MG PO CAPS: 400.0000 mg | ORAL_CAPSULE | Freq: Every day | ORAL | 1 refills | Status: DC

## 2022-06-27 ENCOUNTER — Ambulatory Visit: Payer: Self-pay

## 2022-06-27 NOTE — Telephone Encounter (Signed)
Summary: Right ear advice   Pt is calling to report that he feels that something is in his right ear. No available appts at [facility]. Please advise      Chief Complaint: feels a tickle inside right ear Symptoms: tickling feeling in ear Frequency: 2 weeks ago Pertinent Negatives: Patient denies pain, drainage, swelling,pain Disposition: [] ED /[x] Urgent Care (no appt availability in office) / [] Appointment(In office/virtual)/ []   Virtual Care/ [] Home Care/ [] Refused Recommended Disposition /[]  Mobile Bus/ []  Follow-up with PCP Additional Notes: Pt to go to UC tomorrow. Looks at all providers and none with appt with recommended time frame.  Reason for Disposition  [1] Foreign body AND [2] can't remove at home  Answer Assessment - Initial Assessment Questions 1. LOCATION: "Which ear is involved?"       right 2. SENSATION: "Describe how the ear feels." (e.g. stuffy, full, plugged)."      Tickling feeling 3. ONSET:  "When did the ear symptoms start?"       2 weeks ago 4. PAIN: "Do you also have an earache?" If Yes, ask: "How bad is it?" (Scale 1-10; or mild, moderate, severe)     no 5. CAUSE: "What do you think is causing the ear congestion?"   Taking off mask 6. URI: "Do you have a runny nose or cough?"      no 7. NASAL ALLERGIES: "Are there symptoms of hay fever, such as sneezing or a clear nasal discharge?"     no  Answer Assessment - Initial Assessment Questions 1. OBJECT: "What do you think it is?"      bug 2. PAIN: "Is it causing any pain?" If Yes, ask: "How bad is the pain?"  (e.g., Scale 1-10; or mild, moderate, severe)   - MILD (1-3): doesn't interfere with normal activities    - MODERATE (4-7): interferes with normal activities or awakens from sleep    - SEVERE (8-10): excruciating pain, unable to do any normal activities      no 3. ONSET: "How long do you think it's been in there?"     2 weeks 4. DISCHARGE: "Has there been any discharge or  bleeding from the ear?" If Yes, ask: "Please describe it". (e.g., clear, cloudy, blood)     no 5. OTHER SYMPTOMS: "Do you have any other symptoms?" (e.g., decreased hearing, dizziness)     no  Protocols used: Ear - Congestion-A-AH, Ear - Foreign Body-A-AH

## 2022-06-28 DIAGNOSIS — H6591 Unspecified nonsuppurative otitis media, right ear: Secondary | ICD-10-CM | POA: Diagnosis not present

## 2022-07-14 ENCOUNTER — Other Ambulatory Visit: Payer: Self-pay | Admitting: Family Medicine

## 2022-07-14 DIAGNOSIS — G5601 Carpal tunnel syndrome, right upper limb: Secondary | ICD-10-CM

## 2022-07-14 DIAGNOSIS — E1142 Type 2 diabetes mellitus with diabetic polyneuropathy: Secondary | ICD-10-CM

## 2022-08-30 ENCOUNTER — Other Ambulatory Visit: Payer: Self-pay | Admitting: Family Medicine

## 2022-08-30 DIAGNOSIS — E1142 Type 2 diabetes mellitus with diabetic polyneuropathy: Secondary | ICD-10-CM

## 2022-09-09 NOTE — Progress Notes (Unsigned)
Name: John Howard   MRN: [id number]    DOB: [birth year]   Date:09/12/2022       Progress Note  Subjective  Chief Complaint  Follow Up  HPI  DMII: he is complaints with medication, he takes  Empagliforzin-Linaglip-metformin 25-03-999 daily . He denies hypoglycemic episodes. No polyphagia, polydipsia or polyuria.Marland Kitchen He states glucose this am was 130, most of the time glucose is 99-120's most of the time. His A1C went down from 7% to 6.8% ,6.9% ,6.9 % up to 7.5 % and today is 7.2 %  . He has neuropathy on both legs  he is back on gabapentin at night He also has dyslipidemia and is taking statin therapy  urine micro negative . He has a corn on right 5 th toe that is painful at times, however he got diabetic shoes and is feeling better.  He will cut down on french fries     Hyperlipidemia: last LDL was 62, continue statin therapy, no myalgias . Continue medication, recheck labs next visit    History of PMR: He used to see Rheumatologist , last visit 08/2019, doing well at this time, off prednisone.He has been doing well   GERD: he stopped taking Pepcid and is doing well, he denies any recent episodes of heartburn or indigestion   HTN : bp is  towards low end of normal, and lower than last time, we will stop lisinopril since bp continues to stay low.    Vitamin D deficiency: taking otc supplementation, last level was at goal Continue supplementation   Carpal Tunnels syndrome right: he is taking gabapentin again at night and seems to help with symptoms.   Eustachian tube dysfunction: he developed right ear fullness for about one month, he went to urgent care and was given flonase , he also took benadryl but did not resolve. We will change to loratadine , continue flonase and send him to ENT    Patient Active Problem List   Diagnosis Date Noted   Intermittent low back pain 05/12/2022   GERD without esophagitis 05/12/2022   Carpal tunnel syndrome of right wrist 06/26/2018   Tobacco use  02/28/2018   Osteoarthritis of right hand 02/28/2018   Chronic right shoulder pain 11/23/2016   Vitamin D insufficiency 05/27/2016   Second degree hemorrhoids    Type 2 diabetes mellitus with polyneuropathy ([place]) 11/25/2015   Dyslipidemia 06/23/2015   Essential hypertension 06/23/2015   Blind left eye 06/23/2015    Past Surgical History:  Procedure Laterality Date   COLONOSCOPY     COLONOSCOPY WITH PROPOFOL N/A 03/21/2016   Procedure: COLONOSCOPY WITH PROPOFOL;  Surgeon: Lucilla Lame, MD;  Location: [place];  Service: Endoscopy;  Laterality: N/A;  Diabetic - insulin   EYE SURGERY      Family History  Problem Relation Age of Onset   Hypertension Mother    Diabetes Mother    Diabetes Maternal Grandmother    Heart attack Maternal Grandfather     Social History   Tobacco Use   Smoking status: Some Days    Packs/day: 0.25    Years: 44.00    Total pack years: 11.00    Types: Cigarettes    Start date: 11/28/1974    Last attempt to quit: 02/07/2019    Years since quitting: 3.5   Smokeless tobacco: Never   Tobacco comments:    he has been smoking a few times a week again  Substance Use Topics   Alcohol use: Not Currently  Alcohol/week: 1.0 standard drink of alcohol    Types: 1 Cans of beer per week    Comment: pt is trying to quit, has not had a drink in a month     Current Outpatient Medications:    aspirin 81 MG tablet, Take 81 mg by mouth daily., Disp: , Rfl:    Cholecalciferol (VITAMIN D PO), Take 25 mg by mouth., Disp: , Rfl:    D 1000 25 MCG (1000 UT) capsule, , Disp: , Rfl:    Empagliflozin-Linaglip-Metform (TRIJARDY XR) 25-03-999 MG TB24, Take 1 tablet by mouth daily., Disp: 90 tablet, Rfl: 1   gabapentin (NEURONTIN) 400 MG capsule, TAKE 1 CAPSULE(400 MG) BY MOUTH AT BEDTIME, Disp: 90 capsule, Rfl: 0   Lancets (ONETOUCH DELICA PLUS LANCET33G) MISC, USE TO TEST BLOOD SUGAR THREE TIMES DAILY, Disp: 100 each, Rfl: 5   lisinopril (ZESTRIL) 5 MG tablet, Take  5 mg by mouth daily., Disp: , Rfl:    lovastatin (MEVACOR) 20 MG tablet, Take 1 tablet (20 mg total) by mouth at bedtime., Disp: 90 tablet, Rfl: 1   ONETOUCH ULTRA test strip, CHECK BLOOD SUGAR EVERY DAY, Disp: 100 strip, Rfl: 2  No Known Allergies  I personally reviewed active problem list, medication list, allergies, family history, social history with the patient/caregiver today.   ROS  Constitutional: Negative for fever or weight change.  Respiratory: Negative for cough and shortness of breath.   Cardiovascular: Negative for chest pain or palpitations.  Gastrointestinal: Negative for abdominal pain, no bowel changes.  Musculoskeletal: Negative for gait problem or joint swelling.  Skin: Negative for rash.  Neurological: Negative for dizziness or headache.  No other specific complaints in a complete review of systems (except as listed in HPI above).   Objective  Vitals:   09/12/22 0752  BP: 108/68  Pulse: 91  Resp: 16  SpO2: 96%  Weight: 135 lb (61.2 kg)  Height: 5\' 5"  (1.651 m)    Body mass index is 22.47 kg/m.  Physical Exam  Constitutional: Patient appears well-developed and well-nourished.  No distress.  HEENT: head atraumatic, normocephalic,legally blind left eye , ears normal exam, , neck supple, throat within normal limits Cardiovascular: Normal rate, regular rhythm and normal heart sounds.  No murmur heard. No BLE edema. Pulmonary/Chest: Effort normal and breath sounds normal. No respiratory distress. Abdominal: Soft.  There is no tenderness. Psychiatric: Patient has a normal mood and affect. behavior is normal. Judgment and thought content normal.   PHQ2/9:    09/12/2022    7:53 AM 05/12/2022    7:59 AM 12/24/2021    8:20 AM 08/24/2021    2:49 PM 04/14/2021    8:23 AM  Depression screen PHQ 2/9  Decreased Interest 0 0 0 0 0  Down, Depressed, Hopeless 0 0 0 0 0  PHQ - 2 Score 0 0 0 0 0  Altered sleeping 0 0 0    Tired, decreased energy 0 0 0    Change  in appetite 0 0 0    Feeling bad or failure about yourself  0 0 0    Trouble concentrating 0 0 0    Moving slowly or fidgety/restless 0 0 0    Suicidal thoughts 0 0 0    PHQ-9 Score 0 0 0      phq 9 is negative   Fall Risk:    09/12/2022    7:53 AM 05/12/2022    7:59 AM 12/24/2021    8:20 AM 08/24/2021  2:49 PM 04/14/2021    8:23 AM  Fall Risk   Falls in the past year? 0 0 0 0 0  Number falls in past yr: 0 0 0 0 0  Injury with Fall? 0 0 0 0 0  Risk for fall due to : No Fall Risks No Fall Risks No Fall Risks No Fall Risks   Follow up Falls prevention discussed Falls prevention discussed Falls prevention discussed Falls prevention discussed       Functional Status Survey: Is the patient deaf or have difficulty hearing?: No Does the patient have difficulty seeing, even when wearing glasses/contacts?: No Does the patient have difficulty concentrating, remembering, or making decisions?: No Does the patient have difficulty walking or climbing stairs?: No Does the patient have difficulty dressing or bathing?: No Does the patient have difficulty doing errands alone such as visiting a doctor's office or shopping?: No    Assessment & Plan  1. Type 2 diabetes mellitus with polyneuropathy (HCC)  - POCT HgB A1C - gabapentin (NEURONTIN) 400 MG capsule; TAKE 1 CAPSULE(400 MG) BY MOUTH AT BEDTIME  Dispense: 90 capsule; Refill: 1 - Empagliflozin-Linaglip-Metform (TRIJARDY XR) 25-03-999 MG TB24; Take 1 tablet by mouth daily.  Dispense: 90 tablet; Refill: 1 - lovastatin (MEVACOR) 20 MG tablet; Take 1 tablet (20 mg total) by mouth at bedtime.  Dispense: 90 tablet; Refill: 1 - Microalbumin / creatinine urine ratio  2. Need for immunization against influenza  - Flu Vaccine QUAD 6+ mos PF IM (Fluarix Quad PF)  3. Need for pneumococcal vaccine  - Pneumococcal conjugate vaccine 20-valent (Prevnar 20)  4. Carpal tunnel syndrome of right wrist  - gabapentin (NEURONTIN) 400 MG capsule;  TAKE 1 CAPSULE(400 MG) BY MOUTH AT BEDTIME  Dispense: 90 capsule; Refill: 1  5. Dyslipidemia  - lovastatin (MEVACOR) 20 MG tablet; Take 1 tablet (20 mg total) by mouth at bedtime.  Dispense: 90 tablet; Refill: 1 - Lipid panel  6. Vitamin D insufficiency   7. Long-term use of high-risk medication  - CBC with Differential/Platelet - COMPLETE METABOLIC PANEL WITH GFR  8. Eustachian tube disorder, right  - Ambulatory referral to ENT - loratadine (CLARITIN) 10 MG tablet; Take 1 tablet (10 mg total) by mouth daily.  Dispense: 30 tablet; Refill: 0 - fluticasone (FLONASE) 50 MCG/ACT nasal spray; Place 2 sprays into both nostrils daily.  Dispense: 16 g; Refill: 6

## 2022-09-12 ENCOUNTER — Ambulatory Visit: Payer: 59 | Admitting: Family Medicine

## 2022-09-12 ENCOUNTER — Encounter: Payer: Self-pay | Admitting: Family Medicine

## 2022-09-12 VITALS — BP 108/68 | HR 91 | Resp 16 | Ht 65.0 in | Wt 135.0 lb

## 2022-09-12 DIAGNOSIS — E559 Vitamin D deficiency, unspecified: Secondary | ICD-10-CM | POA: Diagnosis not present

## 2022-09-12 DIAGNOSIS — E1142 Type 2 diabetes mellitus with diabetic polyneuropathy: Secondary | ICD-10-CM

## 2022-09-12 DIAGNOSIS — H6991 Unspecified Eustachian tube disorder, right ear: Secondary | ICD-10-CM

## 2022-09-12 DIAGNOSIS — G5601 Carpal tunnel syndrome, right upper limb: Secondary | ICD-10-CM | POA: Diagnosis not present

## 2022-09-12 DIAGNOSIS — Z23 Encounter for immunization: Secondary | ICD-10-CM | POA: Diagnosis not present

## 2022-09-12 DIAGNOSIS — Z79899 Other long term (current) drug therapy: Secondary | ICD-10-CM | POA: Diagnosis not present

## 2022-09-12 DIAGNOSIS — E785 Hyperlipidemia, unspecified: Secondary | ICD-10-CM

## 2022-09-12 LAB — POCT GLYCOSYLATED HEMOGLOBIN (HGB A1C): Hemoglobin A1C: 7.2 % — AB (ref 4.0–5.6)

## 2022-09-12 MED ORDER — GABAPENTIN 400 MG PO CAPS: ORAL_CAPSULE | ORAL | 1 refills | Status: DC

## 2022-09-12 MED ORDER — TRIJARDY XR 25-5-1000 MG PO TB24: 1.0000 | ORAL_TABLET | Freq: Every day | ORAL | 1 refills | Status: DC

## 2022-09-12 MED ORDER — LORATADINE 10 MG PO TABS: 10.0000 mg | ORAL_TABLET | Freq: Every day | ORAL | 0 refills | Status: DC

## 2022-09-12 MED ORDER — LOVASTATIN 20 MG PO TABS: 20.0000 mg | ORAL_TABLET | Freq: Every day | ORAL | 1 refills | Status: DC

## 2022-09-12 MED ORDER — FLUTICASONE PROPIONATE 50 MCG/ACT NA SUSP: 2.0000 | Freq: Every day | NASAL | 6 refills | Status: DC

## 2022-09-13 LAB — CBC WITH DIFFERENTIAL/PLATELET
Absolute Monocytes: 481 cells/uL (ref 200–950)
Basophils Absolute: 22 cells/uL (ref 0–200)
Basophils Relative: 0.4 %
Eosinophils Absolute: 108 cells/uL (ref 15–500)
Eosinophils Relative: 2 %
HCT: 45.7 % (ref 38.5–50.0)
Hemoglobin: 15.5 g/dL (ref 13.2–17.1)
Lymphs Abs: 1355 cells/uL (ref 850–3900)
MCH: 30.7 pg (ref 27.0–33.0)
MCHC: 33.9 g/dL (ref 32.0–36.0)
MCV: 90.5 fL (ref 80.0–100.0)
MPV: 11.3 fL (ref 7.5–12.5)
Monocytes Relative: 8.9 %
Neutro Abs: 3434 cells/uL (ref 1500–7800)
Neutrophils Relative %: 63.6 %
Platelets: 277 10*3/uL (ref 140–400)
RBC: 5.05 10*6/uL (ref 4.20–5.80)
RDW: 12.4 % (ref 11.0–15.0)
Total Lymphocyte: 25.1 %
WBC: 5.4 10*3/uL (ref 3.8–10.8)

## 2022-09-13 LAB — COMPLETE METABOLIC PANEL WITH GFR
AG Ratio: 1.6 (calc) (ref 1.0–2.5)
ALT: 21 U/L (ref 9–46)
AST: 18 U/L (ref 10–35)
Albumin: 4.4 g/dL (ref 3.6–5.1)
Alkaline phosphatase (APISO): 67 U/L (ref 35–144)
BUN: 19 mg/dL (ref 7–25)
CO2: 25 mmol/L (ref 20–32)
Calcium: 9.8 mg/dL (ref 8.6–10.3)
Chloride: 104 mmol/L (ref 98–110)
Creat: 1.03 mg/dL (ref 0.70–1.35)
Globulin: 2.8 g/dL (calc) (ref 1.9–3.7)
Glucose, Bld: 253 mg/dL — ABNORMAL HIGH (ref 65–99)
Potassium: 4.3 mmol/L (ref 3.5–5.3)
Sodium: 139 mmol/L (ref 135–146)
Total Bilirubin: 0.3 mg/dL (ref 0.2–1.2)
Total Protein: 7.2 g/dL (ref 6.1–8.1)
eGFR: 80 mL/min/{1.73_m2} (ref >=60)

## 2022-09-13 LAB — MICROALBUMIN / CREATININE URINE RATIO
Creatinine, Urine: 32 mg/dL (ref 20–320)
Microalb, Ur: <0.2 mg/dL

## 2022-09-13 LAB — LIPID PANEL
Cholesterol: 132 mg/dL (ref <200)
HDL: 50 mg/dL (ref >=40)
LDL Cholesterol (Calc): 59 mg/dL (calc)
Non-HDL Cholesterol (Calc): 82 mg/dL (calc) (ref <130)
Total CHOL/HDL Ratio: 2.6 (calc) (ref <5.0)
Triglycerides: 146 mg/dL (ref <150)

## 2022-10-03 ENCOUNTER — Telehealth: Payer: Self-pay | Admitting: Family Medicine

## 2022-10-03 NOTE — Telephone Encounter (Signed)
Pt stated the pharmacy advised him his insurance is not wanting to pay for his diabetic medication. Pt is unsure of the medication name. Pt stated pharmacy advised him they would fax this over to PCP to make her aware so office can contact insurance.  Please advise.

## 2022-10-03 NOTE — Telephone Encounter (Signed)
PA initiated

## 2022-10-06 DIAGNOSIS — H6981 Other specified disorders of Eustachian tube, right ear: Secondary | ICD-10-CM | POA: Diagnosis not present

## 2022-11-03 ENCOUNTER — Other Ambulatory Visit: Payer: Self-pay | Admitting: Family Medicine

## 2022-11-03 DIAGNOSIS — G5601 Carpal tunnel syndrome, right upper limb: Secondary | ICD-10-CM

## 2022-11-03 DIAGNOSIS — E1142 Type 2 diabetes mellitus with diabetic polyneuropathy: Secondary | ICD-10-CM

## 2022-12-26 ENCOUNTER — Other Ambulatory Visit: Payer: Self-pay | Admitting: Family Medicine

## 2022-12-26 DIAGNOSIS — E1142 Type 2 diabetes mellitus with diabetic polyneuropathy: Secondary | ICD-10-CM

## 2023-01-05 DIAGNOSIS — H2511 Age-related nuclear cataract, right eye: Secondary | ICD-10-CM | POA: Diagnosis not present

## 2023-01-05 DIAGNOSIS — H26132 Total traumatic cataract, left eye: Secondary | ICD-10-CM | POA: Diagnosis not present

## 2023-01-05 DIAGNOSIS — E119 Type 2 diabetes mellitus without complications: Secondary | ICD-10-CM | POA: Diagnosis not present

## 2023-01-05 DIAGNOSIS — H40053 Ocular hypertension, bilateral: Secondary | ICD-10-CM | POA: Diagnosis not present

## 2023-01-05 LAB — HM DIABETES EYE EXAM

## 2023-01-11 NOTE — Progress Notes (Unsigned)
Name: John Howard   MRN: [id number]    DOB: October 03, 1956   Date:01/12/2023       Progress Note  Subjective  Chief Complaint  Follow Up  HPI  DMII: he is complaints with medication, he takes  Empagliforzin-Linaglip-metformin 25-03-999 daily . He denies hypoglycemic episodes. No polyphagia, polydipsia or polyuria.Marland Kitchen He states his glucose has been around 120's fasting.  His A1C went down from 7% to 6.8% ,6.9% ,6.9 % up to 7.5 % down to 7.2 % and today is 8%   . He has neuropathy on both legs  he is back on gabapentin at night He also has dyslipidemia and is taking statin therapy  urine micro negative We will add Actos 15 mg  Hyperlipidemia: last LDL was 59, continue statin therapy, no myalgias .    History of PMR: He used to see Rheumatologist , last visit 08/2019, doing well at this time, off prednisone.He has been doing well   History of HTN: bp has was low and we stopped lisinopril, bp is still towards low end of normal today    Vitamin D deficiency: taking otc supplementation, last level was at goal, continue supplements   Carpal Tunnels syndrome right: he is taking gabapentin again at night and seems to help with symptoms of burning   OA hands: has daily pain on top of right hand , feels tight and stiff .     Patient Active Problem List   Diagnosis Date Noted   Intermittent low back pain 05/12/2022   GERD without esophagitis 05/12/2022   Carpal tunnel syndrome of right wrist 06/26/2018   Tobacco use 02/28/2018   Osteoarthritis of right hand 02/28/2018   Chronic right shoulder pain 11/23/2016   Vitamin D insufficiency 05/27/2016   Second degree hemorrhoids    Type 2 diabetes mellitus with polyneuropathy ([place]) 11/25/2015   Dyslipidemia 06/23/2015   Essential hypertension 06/23/2015   Blind left eye 06/23/2015    Past Surgical History:  Procedure Laterality Date   COLONOSCOPY     COLONOSCOPY WITH PROPOFOL N/A 03/21/2016   Procedure: COLONOSCOPY WITH PROPOFOL;  Surgeon:  Lucilla Lame, MD;  Location: [place];  Service: Endoscopy;  Laterality: N/A;  Diabetic - insulin   EYE SURGERY      Family History  Problem Relation Age of Onset   Hypertension Mother    Diabetes Mother    Diabetes Maternal Grandmother    Heart attack Maternal Grandfather     Social History   Tobacco Use   Smoking status: Some Days    Packs/day: 0.25    Years: 44.00    Total pack years: 11.00    Types: Cigarettes    Start date: 11/28/1974    Last attempt to quit: 02/07/2019    Years since quitting: 3.9   Smokeless tobacco: Never   Tobacco comments:    he has been smoking a few times a week again  Substance Use Topics   Alcohol use: Not Currently    Alcohol/week: 1.0 standard drink of alcohol    Types: 1 Cans of beer per week    Comment: pt is trying to quit, has not had a drink in a month     Current Outpatient Medications:    aspirin 81 MG tablet, Take 81 mg by mouth daily., Disp: , Rfl:    Cholecalciferol (VITAMIN D PO), Take 25 mg by mouth., Disp: , Rfl:    Empagliflozin-Linaglip-Metform (TRIJARDY XR) 25-03-999 MG TB24, TAKE 1 TABLET BY MOUTH DAILY,  Disp: 90 tablet, Rfl: 0   gabapentin (NEURONTIN) 400 MG capsule, TAKE 1 CAPSULE(400 MG) BY MOUTH AT BEDTIME, Disp: 90 capsule, Rfl: 1   Lancets (ONETOUCH DELICA PLUS 123XX123) MISC, USE TO TEST BLOOD SUGAR THREE TIMES DAILY, Disp: 100 each, Rfl: 5   lovastatin (MEVACOR) 20 MG tablet, Take 1 tablet (20 mg total) by mouth at bedtime., Disp: 90 tablet, Rfl: 1   ONETOUCH ULTRA test strip, CHECK BLOOD SUGAR EVERY DAY, Disp: 100 strip, Rfl: 2   fluticasone (FLONASE) 50 MCG/ACT nasal spray, Place 2 sprays into both nostrils daily. (Patient not taking: Reported on 01/12/2023), Disp: 16 g, Rfl: 6   loratadine (CLARITIN) 10 MG tablet, Take 1 tablet (10 mg total) by mouth daily. (Patient not taking: Reported on 01/12/2023), Disp: 30 tablet, Rfl: 0  No Known Allergies  I personally reviewed active problem list, medication  list, allergies, family history, social history, health maintenance with the patient/caregiver today.   ROS  Constitutional: Negative for fever or weight change.  Respiratory: Negative for cough and shortness of breath.   Cardiovascular: Negative for chest pain or palpitations.  Gastrointestinal: Negative for abdominal pain, no bowel changes.  Musculoskeletal: Negative for gait problem or joint swelling.  Skin: Negative for rash.  Neurological: Negative for dizziness or headache.  No other specific complaints in a complete review of systems (except as listed in HPI above).   Objective  Vitals:   01/12/23 0745  BP: 110/66  Pulse: 86  Resp: 16  SpO2: 97%  Weight: 134 lb (60.8 kg)  Height: 5' 5"$  (1.651 m)    Body mass index is 22.3 kg/m.  Physical Exam  Constitutional: Patient appears well-developed and well-nourished.  No distress.  HEENT: head atraumatic, normocephalic, pupils equal and reactive to light, neck supple Cardiovascular: Normal rate, regular rhythm and normal heart sounds.  No murmur heard. No BLE edema. Pulmonary/Chest: Effort normal and breath sounds normal. No respiratory distress. Abdominal: Soft.  There is no tenderness. Psychiatric: Patient has a normal mood and affect. behavior is normal. Judgment and thought content normal.   PHQ2/9:    01/12/2023    7:47 AM 09/12/2022    7:53 AM 05/12/2022    7:59 AM 12/24/2021    8:20 AM 08/24/2021    2:49 PM  Depression screen PHQ 2/9  Decreased Interest 0 0 0 0 0  Down, Depressed, Hopeless 0 0 0 0 0  PHQ - 2 Score 0 0 0 0 0  Altered sleeping 0 0 0 0   Tired, decreased energy 0 0 0 0   Change in appetite 0 0 0 0   Feeling bad or failure about yourself  0 0 0 0   Trouble concentrating 0 0 0 0   Moving slowly or fidgety/restless 0 0 0 0   Suicidal thoughts 0 0 0 0   PHQ-9 Score 0 0 0 0     phq 9 is negative   Fall Risk:    01/12/2023    7:47 AM 09/12/2022    7:53 AM 05/12/2022    7:59 AM 12/24/2021     8:20 AM 08/24/2021    2:49 PM  Fall Risk   Falls in the past year? 0 0 0 0 0  Number falls in past yr: 0 0 0 0 0  Injury with Fall? 0 0 0 0 0  Risk for fall due to : No Fall Risks No Fall Risks No Fall Risks No Fall Risks No Fall Risks  Follow  up Falls prevention discussed Falls prevention discussed Falls prevention discussed Falls prevention discussed Falls prevention discussed      Functional Status Survey: Is the patient deaf or have difficulty hearing?: No Does the patient have difficulty seeing, even when wearing glasses/contacts?: No Does the patient have difficulty concentrating, remembering, or making decisions?: No Does the patient have difficulty walking or climbing stairs?: No Does the patient have difficulty dressing or bathing?: No Does the patient have difficulty doing errands alone such as visiting a doctor's office or shopping?: No    Assessment & Plan  1. Type 2 diabetes mellitus with polyneuropathy (HCC)  - POCT HgB A1C - Empagliflozin-Linaglip-Metform (TRIJARDY XR) 25-03-999 MG TB24; Take 1 tablet by mouth daily.  Dispense: 90 tablet; Refill: 0 - gabapentin (NEURONTIN) 400 MG capsule; TAKE 1 CAPSULE(400 MG) BY MOUTH AT BEDTIME  Dispense: 90 capsule; Refill: 1 - pioglitazone (ACTOS) 15 MG tablet; Take 1 tablet (15 mg total) by mouth daily.  Dispense: 90 tablet; Refill: 1  2. Carpal tunnel syndrome of right wrist  - gabapentin (NEURONTIN) 400 MG capsule; TAKE 1 CAPSULE(400 MG) BY MOUTH AT BEDTIME  Dispense: 90 capsule; Refill: 1

## 2023-01-12 ENCOUNTER — Ambulatory Visit: Payer: 59 | Admitting: Family Medicine

## 2023-01-12 ENCOUNTER — Encounter: Payer: Self-pay | Admitting: Family Medicine

## 2023-01-12 VITALS — BP 110/66 | HR 86 | Resp 16 | Ht 65.0 in | Wt 134.0 lb

## 2023-01-12 DIAGNOSIS — G5601 Carpal tunnel syndrome, right upper limb: Secondary | ICD-10-CM | POA: Diagnosis not present

## 2023-01-12 DIAGNOSIS — E559 Vitamin D deficiency, unspecified: Secondary | ICD-10-CM | POA: Diagnosis not present

## 2023-01-12 DIAGNOSIS — E1142 Type 2 diabetes mellitus with diabetic polyneuropathy: Secondary | ICD-10-CM | POA: Diagnosis not present

## 2023-01-12 DIAGNOSIS — E785 Hyperlipidemia, unspecified: Secondary | ICD-10-CM

## 2023-01-12 LAB — POCT GLYCOSYLATED HEMOGLOBIN (HGB A1C): Hemoglobin A1C: 8 % — AB (ref 4.0–5.6)

## 2023-01-12 MED ORDER — PIOGLITAZONE HCL 15 MG PO TABS: 15.0000 mg | ORAL_TABLET | Freq: Every day | ORAL | 1 refills | Status: DC

## 2023-01-12 MED ORDER — GABAPENTIN 400 MG PO CAPS: ORAL_CAPSULE | ORAL | 1 refills | Status: DC

## 2023-01-12 MED ORDER — TRIJARDY XR 25-5-1000 MG PO TB24: 1.0000 | ORAL_TABLET | Freq: Every day | ORAL | 0 refills | Status: DC

## 2023-02-23 ENCOUNTER — Other Ambulatory Visit: Payer: Self-pay | Admitting: Family Medicine

## 2023-02-23 DIAGNOSIS — E1142 Type 2 diabetes mellitus with diabetic polyneuropathy: Secondary | ICD-10-CM

## 2023-02-23 DIAGNOSIS — E785 Hyperlipidemia, unspecified: Secondary | ICD-10-CM

## 2023-02-24 NOTE — Telephone Encounter (Signed)
Requested Prescriptions  Pending Prescriptions Disp Refills   lovastatin (MEVACOR) 20 MG tablet [Pharmacy Med Name: LOVASTATIN 20MG  TABLETS] 90 tablet 0    Sig: TAKE 1 TABLET(20 MG) BY MOUTH AT BEDTIME     Cardiovascular:  Antilipid - Statins 2 Failed - 02/23/2023  5:16 PM      Failed - Lipid Panel in normal range within the last 12 months    Cholesterol, Total  Date Value Ref Range Status  02/22/2016 132 100 - 199 mg/dL Final   Cholesterol  Date Value Ref Range Status  09/12/2022 132 <200 mg/dL Final   LDL Cholesterol (Calc)  Date Value Ref Range Status  09/12/2022 59 mg/dL (calc) Final    Comment:    Reference range: <100 . Desirable range <100 mg/dL for primary prevention;   <70 mg/dL for patients with CHD or diabetic patients  with > or = 2 CHD risk factors. Marland Kitchen LDL-C is now calculated using the Martin-Hopkins  calculation, which is a validated novel method providing  better accuracy than the Friedewald equation in the  estimation of LDL-C.  Cresenciano Genre et al. Annamaria Helling. [id number]): 2061-2068  (http://education.[employer].com/faq/FAQ164)    HDL  Date Value Ref Range Status  09/12/2022 50 > OR = 40 mg/dL Final  02/22/2016 59 >39 mg/dL Final   Triglycerides  Date Value Ref Range Status  09/12/2022 146 <150 mg/dL Final         Passed - Cr in normal range and within 360 days    Creat  Date Value Ref Range Status  09/12/2022 1.03 0.70 - 1.35 mg/dL Final   Creatinine, POC  Date Value Ref Range Status  08/24/2016 NEG mg/dL Final   Creatinine, Urine  Date Value Ref Range Status  09/12/2022 32 20 - 320 mg/dL Final         Passed - Patient is not pregnant      Passed - Valid encounter within last 12 months    Recent Outpatient Visits           1 month ago Type 2 diabetes mellitus with polyneuropathy [facility])   [facility] Steele Sizer, MD   5 months ago Type 2 diabetes mellitus with polyneuropathy [facility])   [facility] Steele Sizer, MD   9 months ago Type 2 diabetes mellitus with polyneuropathy [facility])   [facility] Steele Sizer, MD   1 year ago Type 2 diabetes mellitus with polyneuropathy [facility])   [facility] Steele Sizer, MD   1 year ago Type 2 diabetes mellitus with polyneuropathy [facility])   [facility] Steele Sizer, MD       Future Appointments             In 1 month Ancil Boozer, Drue Stager, MD [facility], [facility]

## 2023-04-13 NOTE — Progress Notes (Signed)
Name: John Howard   MRN: [id number]    DOB: 01-Jun-1956   Date:04/14/2023       Progress Note  Subjective  Chief Complaint  Follow Up  HPI  DMII: he is complaints with medication, he takes  Empagliforzin-Linaglip-metformin 25-03-999 daily . He denies hypoglycemic episodes. No polyphagia, polydipsia or polyuria.Marland Kitchen He states his glucose has been around 120's fasting.  His A1C went down from 7% to 6.8% ,6.9% ,6.9 % up to 7.5 % , 7.2 % it went up to 8%   we added Actos and A1C today is down to 7 %  . He has neuropathy on both legs  he is back on gabapentin at night He also has dyslipidemia and is taking statin therapy  urine micro negative   Hyperlipidemia: last LDL was 59, continue statin therapy, no myalgias . Continue medication   Vitamin D deficiency: taking otc supplementation  C[place]els syndrome right: he is taking gabapentin again at night and seems to help with symptoms of burning , he is doing better since he retired   GERD: he states that he has a mild cough after he eats, no heartburn or indigestion. Discussed starting him on PPI but since symptoms are mind and intermittent we will hold off for now  Patient Active Problem List   Diagnosis Date Noted   Intermittent low back pain 05/12/2022   GERD without esophagitis 05/12/2022   Carpal tunnel syndrome of right wrist 06/26/2018   Tobacco use 02/28/2018   Osteoarthritis of right hand 02/28/2018   Chronic right shoulder pain 11/23/2016   Vitamin D deficiency 05/27/2016   Second degree hemorrhoids    Type 2 diabetes mellitus with polyneuropathy (HCC) 11/25/2015   Dyslipidemia 06/23/2015   Essential hypertension 06/23/2015   Blind left eye 06/23/2015    Past Surgical History:  Procedure Laterality Date   COLONOSCOPY     COLONOSCOPY WITH PROPOFOL N/A 03/21/2016   Procedure: COLONOSCOPY WITH PROPOFOL;  Surgeon: Midge Minium, MD;  [facility];  Service: Endoscopy;  Laterality: N/A;  Diabetic - insulin    EYE SURGERY      Family History  Problem Relation Age of Onset   Hypertension Mother    Diabetes Mother    Diabetes Maternal Grandmother    Heart attack Maternal Grandfather     Social History   Tobacco Use   Smoking status: Some Days    Packs/day: 0.25    Years: 44.00    Additional pack years: 0.00    Total pack years: 11.00    Types: Cigarettes    Start date: 11/28/1974    Last attempt to quit: 02/07/2019    Years since quitting: 4.1   Smokeless tobacco: Never   Tobacco comments:    he has been smoking a few times a week again  Substance Use Topics   Alcohol use: Not Currently    Alcohol/week: 1.0 standard drink of alcohol    Types: 1 Cans of beer per week    Comment: pt is trying to quit, has not had a drink in a month     Current Outpatient Medications:    aspirin 81 MG tablet, Take 81 mg by mouth daily., Disp: , Rfl:    Cholecalciferol (VITAMIN D PO), Take 25 mg by mouth., Disp: , Rfl:    Empagliflozin-Linaglip-Metform (TRIJARDY XR) 25-03-999 MG TB24, Take 1 tablet by mouth daily., Disp: 90 tablet, Rfl: 0   gabapentin (NEURONTIN) 400 MG capsule, TAKE 1 CAPSULE(400 MG) BY MOUTH  AT BEDTIME, Disp: 90 capsule, Rfl: 1   Lancets (ONETOUCH DELICA PLUS LANCET33G) MISC, USE TO TEST BLOOD SUGAR THREE TIMES DAILY, Disp: 100 each, Rfl: 5   lovastatin (MEVACOR) 20 MG tablet, TAKE 1 TABLET(20 MG) BY MOUTH AT BEDTIME, Disp: 90 tablet, Rfl: 0   ONETOUCH ULTRA test strip, CHECK BLOOD SUGAR EVERY DAY, Disp: 100 strip, Rfl: 2   pioglitazone (ACTOS) 15 MG tablet, Take 1 tablet (15 mg total) by mouth daily., Disp: 90 tablet, Rfl: 1  No Known Allergies  I personally reviewed active problem list, medication list, allergies, family history, social history, health maintenance with the patient/caregiver today.   ROS  Constitutional: Negative for fever or weight change.  Respiratory: Negative for cough and shortness of breath.   Cardiovascular: Negative for chest pain or palpitations.   Gastrointestinal: Negative for abdominal pain, no bowel changes.  Musculoskeletal: Negative for gait problem or joint swelling.  Skin: Negative for rash.  Neurological: Negative for dizziness or headache.  No other specific complaints in a complete review of systems (except as listed in HPI above).   Objective  Vitals:   04/14/23 0854  BP: 112/66  Pulse: 93  Resp: 16  SpO2: 96%  Weight: 135 lb (61.2 kg)  Height: 5\' 5"  (1.651 m)    Body mass index is 22.47 kg/m.  Physical Exam  Constitutional: Patient appears well-developed and well-nourished.  No distress.  HEENT: head atraumatic, normocephalic, blind of left eye - strabismus -neck supple Cardiovascular: Normal rate, regular rhythm and normal heart sounds.  No murmur heard. No BLE edema. Pulmonary/Chest: Effort normal and breath sounds normal. No respiratory distress. Abdominal: Soft.  There is no tenderness. Psychiatric: Patient has a normal mood and affect. behavior is normal. Judgment and thought content normal.   Recent Results (from the past 2160 hour(s))  POCT HgB A1C     Status: Abnormal   Collection Time: 04/14/23  8:55 AM  Result Value Ref Range   Hemoglobin A1C 7.0 (A) 4.0 - 5.6 %   HbA1c POC (<> result, manual entry)     HbA1c, POC (prediabetic range)     HbA1c, POC (controlled diabetic range)       PHQ2/9:    04/14/2023    8:55 AM 01/12/2023    7:47 AM 09/12/2022    7:53 AM 05/12/2022    7:59 AM 12/24/2021    8:20 AM  Depression screen PHQ 2/9  Decreased Interest 0 0 0 0 0  Down, Depressed, Hopeless 0 0 0 0 0  PHQ - 2 Score 0 0 0 0 0  Altered sleeping 0 0 0 0 0  Tired, decreased energy 0 0 0 0 0  Change in appetite 0 0 0 0 0  Feeling bad or failure about yourself  0 0 0 0 0  Trouble concentrating 0 0 0 0 0  Moving slowly or fidgety/restless 0 0 0 0 0  Suicidal thoughts 0 0 0 0 0  PHQ-9 Score 0 0 0 0 0    phq 9 is negative   Fall Risk:    04/14/2023    8:54 AM 01/12/2023    7:47 AM  09/12/2022    7:53 AM 05/12/2022    7:59 AM 12/24/2021    8:20 AM  Fall Risk   Falls in the past year? 0 0 0 0 0  Number falls in past yr: 0 0 0 0 0  Injury with Fall? 0 0 0 0 0  Risk for fall due  to : No Fall Risks No Fall Risks No Fall Risks No Fall Risks No Fall Risks  Follow up Falls prevention discussed Falls prevention discussed Falls prevention discussed Falls prevention discussed Falls prevention discussed      Functional Status Survey: Is the patient deaf or have difficulty hearing?: No Does the patient have difficulty seeing, even when wearing glasses/contacts?: Yes Does the patient have difficulty concentrating, remembering, or making decisions?: No Does the patient have difficulty walking or climbing stairs?: No Does the patient have difficulty dressing or bathing?: No Does the patient have difficulty doing errands alone such as visiting a doctor's office or shopping?: No    Assessment & Plan  1. Type 2 diabetes mellitus with polyneuropathy (HCC)  - POCT HgB A1C - Empagliflozin-Linaglip-Metform (TRIJARDY XR) 25-03-999 MG TB24; Take 1 tablet by mouth daily.  Dispense: 90 tablet; Refill: 1 - lovastatin (MEVACOR) 20 MG tablet; TAKE 1 TABLET(20 MG) BY MOUTH AT BEDTIME  Dispense: 90 tablet; Refill: 1 - pioglitazone (ACTOS) 15 MG tablet; Take 1 tablet (15 mg total) by mouth daily.  Dispense: 90 tablet; Refill: 1 - gabapentin (NEURONTIN) 400 MG capsule; TAKE 1 CAPSULE(400 MG) BY MOUTH AT BEDTIME  Dispense: 90 capsule; Refill: 1  2. Vitamin D insufficiency  Continue supplements   3. GERD without esophagitis  He does not want medications at this time   4. Dyslipidemia  - lovastatin (MEVACOR) 20 MG tablet; TAKE 1 TABLET(20 MG) BY MOUTH AT BEDTIME  Dispense: 90 tablet; Refill: 1  5. Carpal tunnel syndrome of right wrist  - gabapentin (NEURONTIN) 400 MG capsule; TAKE 1 CAPSULE(400 MG) BY MOUTH AT BEDTIME  Dispense: 90 capsule; Refill: 1

## 2023-04-14 ENCOUNTER — Encounter: Payer: Self-pay | Admitting: Family Medicine

## 2023-04-14 ENCOUNTER — Ambulatory Visit (INDEPENDENT_AMBULATORY_CARE_PROVIDER_SITE_OTHER): Payer: Medicare HMO | Admitting: Family Medicine

## 2023-04-14 VITALS — BP 112/66 | HR 93 | Resp 16 | Ht 65.0 in | Wt 135.0 lb

## 2023-04-14 DIAGNOSIS — G5601 Carpal tunnel syndrome, right upper limb: Secondary | ICD-10-CM

## 2023-04-14 DIAGNOSIS — K219 Gastro-esophageal reflux disease without esophagitis: Secondary | ICD-10-CM | POA: Diagnosis not present

## 2023-04-14 DIAGNOSIS — E1142 Type 2 diabetes mellitus with diabetic polyneuropathy: Secondary | ICD-10-CM

## 2023-04-14 DIAGNOSIS — E559 Vitamin D deficiency, unspecified: Secondary | ICD-10-CM | POA: Diagnosis not present

## 2023-04-14 DIAGNOSIS — Z7984 Long term (current) use of oral hypoglycemic drugs: Secondary | ICD-10-CM

## 2023-04-14 DIAGNOSIS — E785 Hyperlipidemia, unspecified: Secondary | ICD-10-CM | POA: Diagnosis not present

## 2023-04-14 LAB — POCT GLYCOSYLATED HEMOGLOBIN (HGB A1C): Hemoglobin A1C: 7 % — AB (ref 4.0–5.6)

## 2023-04-14 MED ORDER — TRIJARDY XR 25-5-1000 MG PO TB24: 1.0000 | ORAL_TABLET | Freq: Every day | ORAL | 1 refills | Status: DC

## 2023-04-14 MED ORDER — PIOGLITAZONE HCL 15 MG PO TABS: 15.0000 mg | ORAL_TABLET | Freq: Every day | ORAL | 1 refills | Status: DC

## 2023-04-14 MED ORDER — GABAPENTIN 400 MG PO CAPS: ORAL_CAPSULE | ORAL | 1 refills | Status: DC

## 2023-04-14 MED ORDER — LOVASTATIN 20 MG PO TABS: ORAL_TABLET | ORAL | 1 refills | Status: DC

## 2023-05-31 ENCOUNTER — Other Ambulatory Visit: Payer: Self-pay | Admitting: Family Medicine

## 2023-05-31 DIAGNOSIS — E1142 Type 2 diabetes mellitus with diabetic polyneuropathy: Secondary | ICD-10-CM

## 2023-05-31 MED ORDER — ONETOUCH ULTRA VI STRP: ORAL_STRIP | 2 refills | Status: DC

## 2023-05-31 NOTE — Telephone Encounter (Signed)
Requested Prescriptions  Pending Prescriptions Disp Refills   glucose blood (ONETOUCH ULTRA) test strip 100 strip 2    Sig: Use as instructed     Endocrinology: Diabetes - Testing Supplies Passed - 05/31/2023  4:22 PM      Passed - Valid encounter within last 12 months    Recent Outpatient Visits           1 month ago Type 2 diabetes mellitus with polyneuropathy [place] [facility])   [place] Alba Cory, MD   4 months ago Type 2 diabetes mellitus with polyneuropathy [facility])   [place] [facility] Alba Cory, MD   8 months ago Type 2 diabetes mellitus with polyneuropathy [facility])   [place] [facility] Alba Cory, MD   1 year ago Type 2 diabetes mellitus with polyneuropathy [facility])   [facility] Alba Cory, MD   1 year ago Type 2 diabetes mellitus with polyneuropathy [facility])   [facility] Alba Cory, MD       Future Appointments             In 2 weeks Alba Cory, MD [facility]   In 2 months Alba Cory, MD [facility] [place]

## 2023-05-31 NOTE — Telephone Encounter (Signed)
Medication Refill - Medication: ONETOUCH ULTRA test strip   Has the patient contacted their pharmacy? Yes.    Preferred Pharmacy (with phone number or street name):  [id number] [place], [place] - [address] Phone: [phone]  Fax: [phone]     Has the patient been seen for an appointment in the last year OR does the patient have an upcoming appointment? Yes.    Agent: Please be advised that RX refills may take up to 3 business days. We ask that you follow-up with your pharmacy.

## 2023-06-05 NOTE — Telephone Encounter (Unsigned)
Copied from CRM [id number]. Topic: General - Other >> Jun 05, 2023  2:19 PM Everette C wrote: Reason for CRM: The patient's wife has called to check on the status of their previously requested refill of diabetic testing strips  Please contact further when possible

## 2023-06-12 ENCOUNTER — Other Ambulatory Visit: Payer: Self-pay

## 2023-06-12 DIAGNOSIS — E1142 Type 2 diabetes mellitus with diabetic polyneuropathy: Secondary | ICD-10-CM

## 2023-06-12 MED ORDER — LANCETS MISC. MISC: 1.0000 | Freq: Two times a day (BID) | 0 refills | Status: AC

## 2023-06-12 MED ORDER — BLOOD GLUCOSE TEST VI STRP: 1.0000 | ORAL_STRIP | Freq: Two times a day (BID) | 1 refills | Status: DC

## 2023-06-12 MED ORDER — BLOOD GLUCOSE MONITORING SUPPL DEVI: 1.0000 | Freq: Three times a day (TID) | 0 refills | Status: DC

## 2023-06-12 MED ORDER — LANCET DEVICE MISC: 1.0000 | Freq: Two times a day (BID) | 0 refills | Status: AC

## 2023-06-19 NOTE — Progress Notes (Unsigned)
Patient: John Howard, Male    DOB: Dec 25, 1955, 67 y.o.   MRN: [id number]  Visit Date: 06/20/2023  Today's Provider: Ruel Favors, MD   Welcome to Medicare   Subjective:   John Howard is a 67 y.o. male who presents today for his Subsequent Annual Wellness Visit.  Patient/Caregiver input:  no concerns   HPI  IPSS Questionnaire (AUA-7): Over the past month.   1)  How often have you had a sensation of not emptying your bladder completely after you finish urinating?  0 - Not at all  2)  How often have you had to urinate again less than two hours after you finished urinating? 2 - Less than half the time  3)  How often have you found you stopped and started again several times when you urinated?  0 - Not at all  4) How difficult have you found it to postpone urination?  1 - Less than 1 time in 5  5) How often have you had a weak urinary stream?  0 - Not at all  6) How often have you had to push or strain to begin urination?  0 - Not at all  7) How many times did you most typically get up to urinate from the time you went to bed until the time you got up in the morning?  0 - None  Total score:  0-7 mildly symptomatic   8-19 moderately symptomatic   20-35 severely symptomatic     Review of Systems  Constitutional: Negative for fever or weight change.  Respiratory: Negative for cough and shortness of breath.   Cardiovascular: Negative for chest pain or palpitations.  Gastrointestinal: Negative for abdominal pain, no bowel changes.  Musculoskeletal: Negative for gait problem or joint swelling.  Skin: Negative for rash.  Neurological: Negative for dizziness or headache.  No other specific complaints in a complete review of systems (except as listed in HPI above).  Past Medical History:  Diagnosis Date   Arthritis    fingers   Diabetes mellitus without complication (HCC)    Hyperlipidemia    Hypertension    Thoracic compression fracture (HCC) 12/05/2018   Xray Jan 2020    Wears dentures    full upper and lower    Past Surgical History:  Procedure Laterality Date   COLONOSCOPY     COLONOSCOPY WITH PROPOFOL N/A 03/21/2016   Procedure: COLONOSCOPY WITH PROPOFOL;  Surgeon: Midge Minium, MD;  Location: [facility];  Service: Endoscopy;  Laterality: N/A;  Diabetic - insulin   EYE SURGERY      Family History  Problem Relation Age of Onset   Hypertension Mother    Diabetes Mother    Diabetes Maternal Grandmother    Heart attack Maternal Grandfather     Social History   Socioeconomic History   Marital status: Married    Spouse name: Midwife   Number of children: 0   Years of education: Not on file   Highest education level: 11th grade  Occupational History   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   Tobacco Use   Smoking status: Some Days    Current packs/day: 0.00    Average packs/day: 0.2 packs/day for 44.2 years (11.0 ttl pk-yrs)    Types: Cigarettes    Start date: 11/28/1974    Last attempt to quit: 02/07/2019    Years since quitting: 4.3   Smokeless tobacco: Never   Tobacco comments:    he has been smoking a few times  a week again  Vaping Use   Vaping status: Never Used  Substance and Sexual Activity   Alcohol use: Not Currently    Comment: pt is trying to quit, has not had a drink in a month   Drug use: No   Sexual activity: Yes    Partners: Female    Birth control/protection: None  Other Topics Concern   Not on file  Social History Narrative   Married past 10 years, wife has grown children    Social Determinants of Health   Financial Resource Strain: Low Risk  (06/20/2023)   Overall Financial Resource Strain (CARDIA)    Difficulty of Paying Living Expenses: Not hard at all  Food Insecurity: No Food Insecurity (06/20/2023)   Hunger Vital Sign    Worried About Running Out of Food in the Last Year: Never true    Ran Out of Food in the Last Year: Never true  Transportation Needs: No Transportation Needs (06/20/2023)   PRAPARE -  Administrator, Civil Service (Medical): No    Lack of Transportation (Non-Medical): No  Physical Activity: Unknown (06/20/2023)   Exercise Vital Sign    Days of Exercise per Week: 0 days    Minutes of Exercise per Session: Not on file  Stress: No Stress Concern Present (06/20/2023)   [employer] of Occupational Health - Occupational Stress Questionnaire    Feeling of Stress : Not at all  Social Connections: Moderately Isolated (06/20/2023)   Social Connection and Isolation Panel [NHANES]    Frequency of Communication with Friends and Family: Never    Frequency of Social Gatherings with Friends and Family: Never    Attends Religious Services: More than 4 times per year    Active Member of [employer] or Organizations: No    Attends Banker Meetings: Never    Marital Status: Married  Catering manager Violence: Not At Risk (06/20/2023)   Humiliation, Afraid, Rape, and Kick questionnaire    Fear of Current or Ex-Partner: No    Emotionally Abused: No    Physically Abused: No    Sexually Abused: No    Outpatient Encounter Medications as of 06/20/2023  Medication Sig   Accu-Chek Softclix Lancets lancets 2 (two) times daily. as directed   aspirin 81 MG tablet Take 81 mg by mouth daily.   Blood Glucose Monitoring Suppl (ACCU-CHEK GUIDE ME) w/Device KIT USE IN THE MORNING, AT NOON, AND AT BEDTIME.   Blood Glucose Monitoring Suppl DEVI 1 each by Does not apply route in the morning, at noon, and at bedtime. May substitute to any manufacturer covered by patient's insurance.   Cholecalciferol (VITAMIN D PO) Take 25 mg by mouth.   Empagliflozin-Linaglip-Metform (TRIJARDY XR) 25-03-999 MG TB24 Take 1 tablet by mouth daily.   gabapentin (NEURONTIN) 400 MG capsule TAKE 1 CAPSULE(400 MG) BY MOUTH AT BEDTIME   Glucose Blood (BLOOD GLUCOSE TEST STRIPS) STRP 1 each by In Vitro route 2 (two) times daily. May substitute to any manufacturer covered by patient's insurance.   Lancet  Device MISC 1 each by Does not apply route 2 (two) times daily. May substitute to any manufacturer covered by patient's insurance.   Lancets Misc. MISC 1 each by Does not apply route 2 (two) times daily. May substitute to any manufacturer covered by patient's insurance.   lovastatin (MEVACOR) 20 MG tablet TAKE 1 TABLET(20 MG) BY MOUTH AT BEDTIME   pioglitazone (ACTOS) 15 MG tablet Take 1 tablet (15 mg total) by mouth  daily.   No facility-administered encounter medications on file as of 06/20/2023.    No Known Allergies  Care Team Updated in EHR: Yes  Last Vision Exam: 12/2022  Wears corrective lenses: Yes Last Dental Exam: dentures - he checks his gum  Last Hearing Exam: today   Wears Hearing Aids: No  Functional Ability / Safety Screening 1.  Was the timed Get Up and Go test longer than 30 seconds?  yes 2.  Does the patient need help with the phone, transportation, shopping,      preparing meals, housework, laundry, medications, or managing money?  no 3.  Does the patient's home have:  loose throw rugs in the hallway?   no      Grab bars in the bathroom? yes      Handrails on the stairs?    N/A      Poor lighting?   yes 4.  Has the patient noticed any hearing difficulties?   no  Diet Recall and Exercise Regimen: he follows a diabetic diet , discussed silver sneakers   Fall Risk:  See screening under Objective Information  Depression Screen:  See screening under Objective Information  Advanced Directives: A voluntary discussion about advance care planning including the explanation and discussion of advance directives was discussed with the patient. Explanation about the health care proxy and living will was reviewed.  During this discussion, the patient was able to identify a health care proxy as wife  and plans/does not plan to fill out the paperwork required and will bring this to our office to keep on file. Does patient have a HCPOA?    no If yes, name and contact information:  N/A Does patient have a living will or MOST form?  no  Cancer Screenings: Skin: discussed atypical lesions  Lung: Low Dose CT Chest recommended if Age 11-80 years, 30 pack-year currently smoking OR have quit w/in 15years. Patient does not qualify.  Lifestyle risk factor issued reviewed: Diet, exercise, weight management, advised patient smoking is not healthy, nutrition/diet.   Prostate: no family history of prostate cancer  Colon: Colonoscopy 03/21/16  Additional Screenings: Hepatitis B/HIV/Syphillis: N/A Hepatitis C Screening: 12/05/18 Intimate Partner Violence: Negative AAA Screen: Men age 30 to 75 years if ever smoked recommended to get a one time AAA ultrasound screening exam. Patient qualify for screening .  Objective:   Vitals: BP 114/70 (BP Location: Right Arm, Patient Position: Sitting, Cuff Size: Small)   Pulse 92   Temp 97.7 F (36.5 C) (Oral)   Resp 14   Ht 5\' 5"  (1.651 m) Comment: per patient  Wt 140 lb 4.8 oz (63.6 kg)   SpO2 98%   BMI 23.35 kg/m  Body mass index is 23.35 kg/m.  Lab Results  Component Value Date   CHOL 132 09/12/2022   CHOL 136 12/24/2021   CHOL 133 12/02/2020   Lab Results  Component Value Date   HDL 50 09/12/2022   HDL 60 12/24/2021   HDL 64 12/02/2020   Lab Results  Component Value Date   LDLCALC 59 09/12/2022   LDLCALC 62 12/24/2021   LDLCALC 54 12/02/2020   Lab Results  Component Value Date   TRIG 146 09/12/2022   TRIG 62 12/24/2021   TRIG 71 12/02/2020   Lab Results  Component Value Date   CHOLHDL 2.6 09/12/2022   CHOLHDL 2.3 12/24/2021   CHOLHDL 2.1 12/02/2020   No results found for: "LDLDIRECT"  No results found.  Physical Exam Constitutional: Patient appears  well-developed and well-nourished. No distress.  HEENT: head atraumatic, normocephalic, strabismus  ears normal TM, neck supple Cardiovascular: Normal rate, regular rhythm and normal heart sounds.  No murmur heard. No BLE edema. Pulmonary/Chest: Effort normal  and breath sounds normal. No respiratory distress. Abdominal: Soft.  There is no tenderness. Psychiatric: Patient has a normal mood and affect. behavior is normal. Judgment and thought content normal.   Cognitive Testing - 6-CIT  Correct? Score   What year is it? yes 0 Yes = 0    No = 4  What month is it? yes 0 Yes = 0    No = 3  Remember:     Floyde Parkins, [address], [place]     What time is it? yes 0 Yes = 0    No = 3  Count backwards from 20 to 1 yes 0 Correct = 0    1 error = 2   More than 1 error = 4  Say the months of the year in reverse. yes 0 Correct = 0    1 error = 2   More than 1 error = 4  What address did I ask you to remember? yes 0 Correct = 0  1 error = 2    2 error = 4    3 error = 6    4 error = 8    All wrong = 10       TOTAL SCORE  0/28   Interpretation:  Normal  Normal (0-7) Abnormal (8-28)   Fall Risk:    06/20/2023   10:13 AM 04/14/2023    8:54 AM 01/12/2023    7:47 AM 09/12/2022    7:53 AM 05/12/2022    7:59 AM  Fall Risk   Falls in the past year? 0 0 0 0 0  Number falls in past yr:  0 0 0 0  Injury with Fall?  0 0 0 0  Risk for fall due to : No Fall Risks No Fall Risks No Fall Risks No Fall Risks No Fall Risks  Follow up Falls prevention discussed;Education provided;Falls evaluation completed Falls prevention discussed Falls prevention discussed Falls prevention discussed Falls prevention discussed    Depression Screen    06/20/2023   10:13 AM 06/20/2023   10:09 AM 04/14/2023    8:55 AM 01/12/2023    7:47 AM 09/12/2022    7:53 AM  Depression screen PHQ 2/9  Decreased Interest 0 0 0 0 0  Down, Depressed, Hopeless 0 0 0 0 0  PHQ - 2 Score 0 0 0 0 0  Altered sleeping 0  0 0 0  Tired, decreased energy 0  0 0 0  Change in appetite 0  0 0 0  Feeling bad or failure about yourself  0  0 0 0  Trouble concentrating 0  0 0 0  Moving slowly or fidgety/restless 0  0 0 0  Suicidal thoughts 0  0 0 0  PHQ-9 Score 0  0 0 0    Recent Results (from the past  2160 hour(s))  POCT HgB A1C     Status: Abnormal   Collection Time: 04/14/23  8:55 AM  Result Value Ref Range   Hemoglobin A1C 7.0 (A) 4.0 - 5.6 %   HbA1c POC (<> result, manual entry)     HbA1c, POC (prediabetic range)     HbA1c, POC (controlled diabetic range)     Hearing Screening   500Hz  1000Hz  2000Hz  4000Hz   5000Hz   Right ear Fail Fail Pass Pass Pass  Left ear Pass Pass Pass Pass Pass      Assessment & Plan:     1. Welcome to Medicare preventive visit    Exercise Activities and Dietary recommendations  Discussed 150 minutes of physical activity per week Continue diabetic diet Continue drinking 4-5 bottles of water per day   Discussed health benefits of physical activity, and encouraged him to engage in regular exercise appropriate for his age and condition.   Immunization History  Administered Date(s) Administered   Influenza,inj,Quad PF,6+ Mos 09/23/2015, 08/24/2016, 08/30/2017, 11/23/2017, 07/20/2018, 09/20/2019, 08/14/2020, 08/24/2021, 09/12/2022   Moderna Sars-Covid-2 Vaccination 01/31/2020, 02/28/2020, 11/12/2020   PNEUMOCOCCAL CONJUGATE-20 09/12/2022   Pneumococcal Conjugate-13 08/12/2014   Pneumococcal Polysaccharide-23 09/22/2004, 11/30/2017   Tdap 12/05/2018   Zoster Recombinant(Shingrix) 11/23/2017, 08/31/2021   Zoster, Live 02/14/2017    Health Maintenance  Topic Date Due   FOOT EXAM  05/13/2023   COVID-19 Vaccine (4 - 2023-24 season) 07/06/2023 (Originally 07/29/2022)   INFLUENZA VACCINE  06/29/2023   Diabetic kidney evaluation - eGFR measurement  09/13/2023   Diabetic kidney evaluation - Urine ACR  09/13/2023   HEMOGLOBIN A1C  10/15/2023   OPHTHALMOLOGY EXAM  01/06/2024   Medicare Annual Wellness (AWV)  06/19/2024   Colonoscopy  03/21/2026   DTaP/Tdap/Td (2 - Td or Tdap) 12/05/2028   Pneumonia Vaccine 72+ Years old  Completed   Hepatitis C Screening  Completed   Zoster Vaccines- Shingrix  Completed   HPV VACCINES  Aged Out     No orders of  the defined types were placed in this encounter.   Current Outpatient Medications:    Accu-Chek Softclix Lancets lancets, 2 (two) times daily. as directed, Disp: , Rfl:    aspirin 81 MG tablet, Take 81 mg by mouth daily., Disp: , Rfl:    Blood Glucose Monitoring Suppl (ACCU-CHEK GUIDE ME) w/Device KIT, USE IN THE MORNING, AT NOON, AND AT BEDTIME., Disp: , Rfl:    Blood Glucose Monitoring Suppl DEVI, 1 each by Does not apply route in the morning, at noon, and at bedtime. May substitute to any manufacturer covered by patient's insurance., Disp: 1 each, Rfl: 0   Cholecalciferol (VITAMIN D PO), Take 25 mg by mouth., Disp: , Rfl:    Empagliflozin-Linaglip-Metform (TRIJARDY XR) 25-03-999 MG TB24, Take 1 tablet by mouth daily., Disp: 90 tablet, Rfl: 1   gabapentin (NEURONTIN) 400 MG capsule, TAKE 1 CAPSULE(400 MG) BY MOUTH AT BEDTIME, Disp: 90 capsule, Rfl: 1   Glucose Blood (BLOOD GLUCOSE TEST STRIPS) STRP, 1 each by In Vitro route 2 (two) times daily. May substitute to any manufacturer covered by patient's insurance., Disp: 100 each, Rfl: 1   Lancet Device MISC, 1 each by Does not apply route 2 (two) times daily. May substitute to any manufacturer covered by patient's insurance., Disp: 1 each, Rfl: 0   Lancets Misc. MISC, 1 each by Does not apply route 2 (two) times daily. May substitute to any manufacturer covered by patient's insurance., Disp: 100 each, Rfl: 0   lovastatin (MEVACOR) 20 MG tablet, TAKE 1 TABLET(20 MG) BY MOUTH AT BEDTIME, Disp: 90 tablet, Rfl: 1   pioglitazone (ACTOS) 15 MG tablet, Take 1 tablet (15 mg total) by mouth daily., Disp: 90 tablet, Rfl: 1 There are no discontinued medications.  I have personally reviewed and addressed the Medicare Annual Wellness health risk assessment questionnaire and have noted the following in the patient's chart:  A.  Medical and social history & family history B.         Use of alcohol, tobacco or illicit drugs  C.         Current medications  and supplements D.         Functional and Cognitive ability and status E.         Nutritional status F.         Physical activity G.        Advance directives H.         List of other physicians I.          Hospitalizations, surgeries, and ER visits in previous 12 months J.         Vitals K.         Screenings such as hearing and vision if needed, cognitive and depression L.         Referrals and appointments -   In addition, I have reviewed and discussed with patient certain preventive protocols, quality metrics, and best practice recommendations. A written personalized care plan for preventive services as well as general preventive health recommendations were provided to patient.   See attached scanned questionnaire for additional information.   Return in about 1 year (around 06/19/2024).

## 2023-06-20 ENCOUNTER — Ambulatory Visit (INDEPENDENT_AMBULATORY_CARE_PROVIDER_SITE_OTHER): Payer: Medicare HMO | Admitting: Family Medicine

## 2023-06-20 ENCOUNTER — Encounter: Payer: Self-pay | Admitting: Family Medicine

## 2023-06-20 VITALS — BP 114/70 | HR 92 | Temp 97.7°F | Resp 14 | Ht 65.0 in | Wt 140.3 lb

## 2023-06-20 DIAGNOSIS — Z720 Tobacco use: Secondary | ICD-10-CM

## 2023-06-20 DIAGNOSIS — I10 Essential (primary) hypertension: Secondary | ICD-10-CM | POA: Diagnosis not present

## 2023-06-20 DIAGNOSIS — Z0000 Encounter for general adult medical examination without abnormal findings: Secondary | ICD-10-CM

## 2023-06-20 DIAGNOSIS — Z1211 Encounter for screening for malignant neoplasm of colon: Secondary | ICD-10-CM | POA: Diagnosis not present

## 2023-06-22 ENCOUNTER — Telehealth: Payer: Self-pay

## 2023-06-22 NOTE — Telephone Encounter (Signed)
Colonoscopy referral received from patients PCP.  Chart reviewed.  Last colonoscopy was with Dr. Servando Snare 03/21/16.  No polyps were noted.  Dr. Servando Snare recommended repeat in 10 years.  Thanks, [place], [place]

## 2023-08-12 ENCOUNTER — Other Ambulatory Visit: Payer: Self-pay | Admitting: Family Medicine

## 2023-08-12 DIAGNOSIS — E1142 Type 2 diabetes mellitus with diabetic polyneuropathy: Secondary | ICD-10-CM

## 2023-08-14 NOTE — Progress Notes (Unsigned)
Name: John Howard   MRN: [id number]    DOB: [birth year]   Date:08/15/2023       Progress Note  Subjective  Chief Complaint  Follow Up  HPI  DMII: he is complaints with medication, he has been taking Empagliforzin-Linaglip-metformin 25-03-999 daily . He denies hypoglycemic episodes. No polyphagia, polydipsia or polyuria.Marland Kitchen He states his glucose has been around 100 fasting but this morning was 140. He has been eating more peanut butter crackers lately .  His A1C went down from 7% to 6.8% ,6.9% ,6.9 % up to 7.5 % , 7.2 % it went up to 8%   we added Actos and it went down to  7 % today is up again at 7.3%  discussed going up on Metformin but he just filled his rx . He has neuropathy on both legs  he is back on gabapentin at night He also has dyslipidemia and is taking statin therapy , we will recheck labs   Hyperlipidemia: last LDL was 59, continue statin therapy, no myalgias . He takes Mevacor and we will recheck labs    Vitamin D deficiency: taking otc supplementation, we will recheck labs   Carpal Tunnels syndrome right: he is taking gabapentin again at night and seems to help with symptoms of burning , he is doing better since he retired . Unchanged   GERD: he states that he has a mild cough after he eats, no heartburn or indigestion. Discussed starting him on PPI but he did not want medication, symptoms are slightly better   Intermittent low back pain: mild symptoms no radiculitis   Patient Active Problem List   Diagnosis Date Noted   Intermittent low back pain 05/12/2022   GERD without esophagitis 05/12/2022   Carpal tunnel syndrome of right wrist 06/26/2018   Tobacco use 02/28/2018   Osteoarthritis of right hand 02/28/2018   Chronic right shoulder pain 11/23/2016   Vitamin D deficiency 05/27/2016   Second degree hemorrhoids    Type 2 diabetes mellitus with polyneuropathy (HCC) 11/25/2015   Dyslipidemia 06/23/2015   Essential hypertension 06/23/2015   Blind left eye 06/23/2015     Past Surgical History:  Procedure Laterality Date   COLONOSCOPY     COLONOSCOPY WITH PROPOFOL N/A 03/21/2016   Procedure: COLONOSCOPY WITH PROPOFOL;  Surgeon: Midge Minium, MD;  Location: [facility];  Service: Endoscopy;  Laterality: N/A;  Diabetic - insulin   EYE SURGERY      Family History  Problem Relation Age of Onset   Hypertension Mother    Diabetes Mother    Diabetes Maternal Grandmother    Heart attack Maternal Grandfather     Social History   Tobacco Use   Smoking status: Some Days    Current packs/day: 0.00    Average packs/day: 0.2 packs/day for 44.2 years (11.0 ttl pk-yrs)    Types: Cigarettes    Start date: 11/28/1974    Last attempt to quit: 02/07/2019    Years since quitting: 4.5   Smokeless tobacco: Never   Tobacco comments:    he has been smoking a few times a week again  Substance Use Topics   Alcohol use: Not Currently    Comment: pt is trying to quit, has not had a drink in a month     Current Outpatient Medications:    ACCU-CHEK GUIDE test strip, 1 EACH BY IN VITRO ROUTE 2 (TWO) TIMES DAILY., Disp: 100 strip, Rfl: 1   Accu-Chek Softclix Lancets lancets, 2 (two) times daily.  as directed, Disp: , Rfl:    aspirin 81 MG tablet, Take 81 mg by mouth daily., Disp: , Rfl:    Blood Glucose Monitoring Suppl (ACCU-CHEK GUIDE ME) w/Device KIT, USE IN THE MORNING, AT NOON, AND AT BEDTIME., Disp: , Rfl:    Blood Glucose Monitoring Suppl (ACCU-CHEK GUIDE ME) w/Device KIT, USE IN THE MORNING, AT NOON, AND AT BEDTIME., Disp: 1 kit, Rfl: 0   Cholecalciferol (VITAMIN D PO), Take 25 mg by mouth., Disp: , Rfl:    Empagliflozin-Linaglip-Metform (TRIJARDY XR) 25-03-999 MG TB24, Take 1 tablet by mouth daily., Disp: 90 tablet, Rfl: 1   gabapentin (NEURONTIN) 400 MG capsule, TAKE 1 CAPSULE(400 MG) BY MOUTH AT BEDTIME, Disp: 90 capsule, Rfl: 1   lovastatin (MEVACOR) 20 MG tablet, TAKE 1 TABLET(20 MG) BY MOUTH AT BEDTIME, Disp: 90 tablet, Rfl: 1   pioglitazone (ACTOS)  15 MG tablet, Take 1 tablet (15 mg total) by mouth daily., Disp: 90 tablet, Rfl: 1  No Known Allergies  I personally reviewed active problem list, medication list, allergies, family history, social history, health maintenance with the patient/caregiver today.   ROS  Ten systems reviewed and is negative except as mentioned in HPI    Objective  Vitals:   08/15/23 0911  BP: (!) 116/58  Pulse: 86  Resp: 16  Temp: 97.7 F (36.5 C)  TempSrc: Oral  SpO2: 97%  Weight: 142 lb 14.4 oz (64.8 kg)  Height: 5\' 5"  (1.651 m)    Body mass index is 23.78 kg/m.  Physical Exam  Constitutional: Patient appears well-developed and well-nourished.  No distress.  HEENT: head atraumatic, normocephalic, left eye strabismus legally blind on left eye,  neck supple Cardiovascular: Normal rate, regular rhythm and normal heart sounds.  No murmur heard. No BLE edema. Pulmonary/Chest: Effort normal and breath sounds normal. No respiratory distress. Abdominal: Soft.  There is no tenderness. Psychiatric: Patient has a normal mood and affect. behavior is normal. Judgment and thought content normal.   Recent Results (from the past 2160 hour(s))  POCT HgB A1C     Status: Abnormal   Collection Time: 08/15/23  9:14 AM  Result Value Ref Range   Hemoglobin A1C 7.3 (A) 4.0 - 5.6 %   HbA1c POC (<> result, manual entry)     HbA1c, POC (prediabetic range)     HbA1c, POC (controlled diabetic range)      Diabetic Foot Exam: Diabetic Foot Exam - Simple   Simple Foot Form Visual Inspection No deformities, no ulcerations, no other skin breakdown bilaterally: Yes Sensation Testing Intact to touch and monofilament testing bilaterally: Yes Pulse Check Posterior Tibialis and Dorsalis pulse intact bilaterally: Yes Comments      PHQ2/9:    08/15/2023    9:13 AM 06/20/2023   10:13 AM 06/20/2023   10:09 AM 04/14/2023    8:55 AM 01/12/2023    7:47 AM  Depression screen PHQ 2/9  Decreased Interest 0 0 0 0 0   Down, Depressed, Hopeless 0 0 0 0 0  PHQ - 2 Score 0 0 0 0 0  Altered sleeping 0 0  0 0  Tired, decreased energy 0 0  0 0  Change in appetite 0 0  0 0  Feeling bad or failure about yourself  0 0  0 0  Trouble concentrating 0 0  0 0  Moving slowly or fidgety/restless 0 0  0 0  Suicidal thoughts 0 0  0 0  PHQ-9 Score 0 0  0 0  phq 9 is negative   Fall Risk:    08/15/2023    9:12 AM 06/20/2023   10:13 AM 04/14/2023    8:54 AM 01/12/2023    7:47 AM 09/12/2022    7:53 AM  Fall Risk   Falls in the past year? 0 0 0 0 0  Number falls in past yr:   0 0 0  Injury with Fall?   0 0 0  Risk for fall due to : No Fall Risks No Fall Risks No Fall Risks No Fall Risks No Fall Risks  Follow up Falls prevention discussed Falls prevention discussed;Education provided;Falls evaluation completed Falls prevention discussed Falls prevention discussed Falls prevention discussed      Functional Status Survey: Is the patient deaf or have difficulty hearing?: No Does the patient have difficulty seeing, even when wearing glasses/contacts?: No Does the patient have difficulty concentrating, remembering, or making decisions?: No Does the patient have difficulty walking or climbing stairs?: No Does the patient have difficulty dressing or bathing?: No Does the patient have difficulty doing errands alone such as visiting a doctor's office or shopping?: No    Assessment & Plan   1. Type 2 diabetes mellitus with polyneuropathy (HCC)  - POCT HgB A1C - COMPLETE METABOLIC PANEL WITH GFR - Urine Microalbumin w/creat. ratio - HM Diabetes Foot Exam  2. GERD without esophagitis  Stable   3. Vitamin D insufficiency  - VITAMIN D 25 Hydroxy (Vit-D Deficiency, Fractures)  4. Dyslipidemia  - Lipid panel  5. Need for immunization against influenza  - Flu Vaccine Trivalent High Dose (Fluad)  6. Long-term use of high-risk medication  - B12 and Folate Panel - CBC with Differential/Platelet  7.  Carpal tunnel syndrome of right wrist  Stable  8. Intermittent low back pain

## 2023-08-15 ENCOUNTER — Encounter: Payer: Self-pay | Admitting: Family Medicine

## 2023-08-15 ENCOUNTER — Ambulatory Visit (INDEPENDENT_AMBULATORY_CARE_PROVIDER_SITE_OTHER): Payer: Medicare HMO | Admitting: Family Medicine

## 2023-08-15 VITALS — BP 116/58 | HR 86 | Temp 97.7°F | Resp 16 | Ht 65.0 in | Wt 142.9 lb

## 2023-08-15 DIAGNOSIS — Z79899 Other long term (current) drug therapy: Secondary | ICD-10-CM

## 2023-08-15 DIAGNOSIS — K219 Gastro-esophageal reflux disease without esophagitis: Secondary | ICD-10-CM | POA: Diagnosis not present

## 2023-08-15 DIAGNOSIS — G5601 Carpal tunnel syndrome, right upper limb: Secondary | ICD-10-CM

## 2023-08-15 DIAGNOSIS — E559 Vitamin D deficiency, unspecified: Secondary | ICD-10-CM | POA: Diagnosis not present

## 2023-08-15 DIAGNOSIS — Z7984 Long term (current) use of oral hypoglycemic drugs: Secondary | ICD-10-CM | POA: Diagnosis not present

## 2023-08-15 DIAGNOSIS — E1142 Type 2 diabetes mellitus with diabetic polyneuropathy: Secondary | ICD-10-CM | POA: Diagnosis not present

## 2023-08-15 DIAGNOSIS — M545 Low back pain, unspecified: Secondary | ICD-10-CM

## 2023-08-15 DIAGNOSIS — E785 Hyperlipidemia, unspecified: Secondary | ICD-10-CM | POA: Diagnosis not present

## 2023-08-15 DIAGNOSIS — Z23 Encounter for immunization: Secondary | ICD-10-CM | POA: Diagnosis not present

## 2023-08-15 LAB — POCT GLYCOSYLATED HEMOGLOBIN (HGB A1C): Hemoglobin A1C: 7.3 % — AB (ref 4.0–5.6)

## 2023-08-16 ENCOUNTER — Other Ambulatory Visit: Payer: Self-pay | Admitting: Family Medicine

## 2023-08-16 LAB — CBC WITH DIFFERENTIAL/PLATELET
Absolute Monocytes: 495 {cells}/uL (ref 200–950)
Basophils Absolute: 10 {cells}/uL (ref 0–200)
Basophils Relative: 0.2 %
Eosinophils Absolute: 113 {cells}/uL (ref 15–500)
Eosinophils Relative: 2.3 %
HCT: 45.3 % (ref 38.5–50.0)
Hemoglobin: 15.1 g/dL (ref 13.2–17.1)
Lymphs Abs: 1421 {cells}/uL (ref 850–3900)
MCH: 30.6 pg (ref 27.0–33.0)
MCHC: 33.3 g/dL (ref 32.0–36.0)
MCV: 91.7 fL (ref 80.0–100.0)
MPV: 11.2 fL (ref 7.5–12.5)
Monocytes Relative: 10.1 %
Neutro Abs: 2862 {cells}/uL (ref 1500–7800)
Neutrophils Relative %: 58.4 %
Platelets: 221 10*3/uL (ref 140–400)
RBC: 4.94 10*6/uL (ref 4.20–5.80)
RDW: 12.2 % (ref 11.0–15.0)
Total Lymphocyte: 29 %
WBC: 4.9 10*3/uL (ref 3.8–10.8)

## 2023-11-01 NOTE — Progress Notes (Signed)
Name: John Howard   MRN: [id number]    DOB: 1956/09/21   Date:11/09/2023       Progress Note  Subjective  Chief Complaint  Chief Complaint  Patient presents with   Medical Management of Chronic Issues    HPI  Discussed the use of AI scribe software for clinical note transcription with the patient, who gave verbal consent to proceed.  History of Present Illness   The patient, with a history of diabetes, hyperlipidemia, and neuropathy, presents for a follow-up visit. He reports feeling generally well and has been maintaining an active lifestyle, including walking on a treadmill. He has noticed occasional dizziness after treadmill use, but denies any lightheadedness or dizziness upon standing.  The patient's blood pressure has been trending towards the lower end of normal, with a recent reading of 106/64, down from a previous 116/58. He is not currently on any antihypertensive medications, having previously discontinued them.  In terms of his diabetes management, the patient's last A1c was slightly elevated at 7.3%, with a goal of being below 7%. He is currently on a regimen of pioglitazone and Trijardy XR, and has been making dietary modifications to reduce carbohydrate intake. Home glucose monitoring has shown morning readings between 116-118.  The patient also reports neuropathy in both legs, which is managed with gabapentin. He notes that the medication seems to be effective for his leg symptoms, but less so for his carpal tunnel syndrome. He denies any numbness, tingling, or dropping of objects, and attributes hand discomfort to arthritis.  The patient's hyperlipidemia is managed with lovastatin 20mg , and his most recent cholesterol level was satisfactory at 58. He also takes a daily baby aspirin and vitamin D supplement.  The patient denies any recent changes in appetite, excessive thirst, or increased urination. He has maintained a stable weight, with a slight recent loss  attributed to increased physical activity. He denies any current issues with back pain.  The patient is scheduled for an upcoming AAA screening due to a past history of smoking. He has not smoked for a significant period of time.         Patient Active Problem List   Diagnosis Date Noted   Intermittent low back pain 05/12/2022   GERD without esophagitis 05/12/2022   Carpal tunnel syndrome of right wrist 06/26/2018   Tobacco use 02/28/2018   Osteoarthritis of right hand 02/28/2018   Chronic right shoulder pain 11/23/2016   Vitamin D deficiency 05/27/2016   Second degree hemorrhoids    Type 2 diabetes mellitus with polyneuropathy (HCC) 11/25/2015   Dyslipidemia 06/23/2015   Blind left eye 06/23/2015    Past Surgical History:  Procedure Laterality Date   COLONOSCOPY     COLONOSCOPY WITH PROPOFOL N/A 03/21/2016   Procedure: COLONOSCOPY WITH PROPOFOL;  Surgeon: Midge Minium, MD;  Location: [facility];  Service: Endoscopy;  Laterality: N/A;  Diabetic - insulin   EYE SURGERY      Family History  Problem Relation Age of Onset   Hypertension Mother    Diabetes Mother    Diabetes Maternal Grandmother    Heart attack Maternal Grandfather     Social History   Tobacco Use   Smoking status: Some Days    Current packs/day: 0.00    Average packs/day: 0.2 packs/day for 44.2 years (11.0 ttl pk-yrs)    Types: Cigarettes    Start date: 11/28/1974    Last attempt to quit: 02/07/2019    Years since quitting: 4.7   Smokeless  tobacco: Never   Tobacco comments:    he has been smoking a few times a week again  Substance Use Topics   Alcohol use: Not Currently    Comment: pt is trying to quit, has not had a drink in a month     Current Outpatient Medications:    ACCU-CHEK GUIDE test strip, 1 EACH BY IN VITRO ROUTE 2 (TWO) TIMES DAILY., Disp: 100 strip, Rfl: 1   Accu-Chek Softclix Lancets lancets, 2 (two) times daily. as directed, Disp: , Rfl:    aspirin 81 MG tablet, Take 81 mg by  mouth daily., Disp: , Rfl:    Blood Glucose Monitoring Suppl (ACCU-CHEK GUIDE ME) w/Device KIT, USE IN THE MORNING, AT NOON, AND AT BEDTIME., Disp: , Rfl:    Blood Glucose Monitoring Suppl (ACCU-CHEK GUIDE ME) w/Device KIT, USE IN THE MORNING, AT NOON, AND AT BEDTIME., Disp: 1 kit, Rfl: 0   Cholecalciferol (VITAMIN D PO), Take 25 mg by mouth., Disp: , Rfl:    Empagliflozin-Linaglip-Metform (TRIJARDY XR) 25-03-999 MG TB24, Take 1 tablet by mouth daily., Disp: 90 tablet, Rfl: 1   gabapentin (NEURONTIN) 400 MG capsule, TAKE 1 CAPSULE(400 MG) BY MOUTH AT BEDTIME, Disp: 90 capsule, Rfl: 1   lovastatin (MEVACOR) 20 MG tablet, TAKE 1 TABLET(20 MG) BY MOUTH AT BEDTIME, Disp: 90 tablet, Rfl: 1   pioglitazone (ACTOS) 15 MG tablet, Take 1 tablet (15 mg total) by mouth daily., Disp: 90 tablet, Rfl: 1  No Known Allergies  I personally reviewed active problem list, medication list, allergies, family history with the patient/caregiver today.   ROS  Ten systems reviewed and is negative except as mentioned in HPI    Objective  Vitals:   11/09/23 0942  BP: 106/64  Pulse: (!) 112  Resp: 16  Temp: 98.2 F (36.8 C)  TempSrc: Oral  SpO2: 97%  Weight: 141 lb 6.4 oz (64.1 kg)  Height: 5\' 5"  (1.651 m)    Body mass index is 23.53 kg/m.  Physical Exam  Constitutional: Patient appears well-developed and well-nourished.No distress.  HEENT: head atraumatic, normocephalic, strabismus, neck supple Cardiovascular: Normal rate, regular rhythm and normal heart sounds.  No murmur heard. No BLE edema. Pulmonary/Chest: Effort normal and breath sounds normal. No respiratory distress. Abdominal: Soft.  There is no tenderness. Psychiatric: Patient has a normal mood and affect. behavior is normal. Judgment and thought content normal.   Recent Results (from the past 2160 hours)  POCT HgB A1C     Status: Abnormal   Collection Time: 08/15/23  9:14 AM  Result Value Ref Range   Hemoglobin A1C 7.3 (A) 4.0 - 5.6 %    HbA1c POC (<> result, manual entry)     HbA1c, POC (prediabetic range)     HbA1c, POC (controlled diabetic range)    COMPLETE METABOLIC PANEL WITH GFR     Status: Abnormal   Collection Time: 08/15/23 10:11 AM  Result Value Ref Range   Glucose, Bld 208 (H) 65 - 99 mg/dL    Comment: .            Fasting reference interval . For someone without known diabetes, a glucose value >125 mg/dL indicates that they may have diabetes and this should be confirmed with a follow-up test. .    BUN 21 7 - 25 mg/dL   Creat 7.82 9.56 - 2.13 mg/dL   eGFR 84 > OR = 60 YQ/MVH/8.46N6   BUN/Creatinine Ratio SEE NOTE: 6 - 22 (calc)    Comment:  Not Reported: BUN and Creatinine are within    reference range. .    Sodium 137 135 - 146 mmol/L   Potassium 4.6 3.5 - 5.3 mmol/L   Chloride 102 98 - 110 mmol/L   CO2 25 20 - 32 mmol/L   Calcium 10.2 8.6 - 10.3 mg/dL   Total Protein 7.1 6.1 - 8.1 g/dL   Albumin 4.5 3.6 - 5.1 g/dL   Globulin 2.6 1.9 - 3.7 g/dL (calc)   AG Ratio 1.7 1.0 - 2.5 (calc)   Total Bilirubin 0.3 0.2 - 1.2 mg/dL   Alkaline phosphatase (APISO) 65 35 - 144 U/L   AST 13 10 - 35 U/L   ALT 13 9 - 46 U/L  Urine Microalbumin w/creat. ratio     Status: None   Collection Time: 08/15/23 10:11 AM  Result Value Ref Range   Creatinine, Urine 36 20 - 320 mg/dL   Microalb, Ur <4.3 mg/dL    Comment: Reference Range Not established    Microalb Creat Ratio NOTE <30 mg/g creat    Comment: NOTE: The urine albumin value is less than  0.2 mg/dL therefore we are unable to calculate  excretion and/or creatinine ratio. . The ADA defines abnormalities in albumin excretion as follows: Marland Kitchen Albuminuria Category        Result (mg/g creatinine) . Normal to Mildly increased   <30 Moderately increased         30-299  Severely increased           > OR = 300 . The ADA recommends that at least two of three specimens collected within a 3-6 month period be abnormal before considering a patient to  be within a diagnostic category.   B12 and Folate Panel     Status: None   Collection Time: 08/15/23 10:11 AM  Result Value Ref Range   Vitamin B-12 899 200 - 1,100 pg/mL   Folate >24.0 ng/mL    Comment:                            Reference Range                            Low:           <3.4                            Borderline:    3.4-5.4                            Normal:        >5.4 .   Lipid panel     Status: None   Collection Time: 08/15/23 10:11 AM  Result Value Ref Range   Cholesterol 134 <200 mg/dL   HDL 59 > OR = 40 mg/dL   Triglycerides 89 <329 mg/dL   LDL Cholesterol (Calc) 58 mg/dL (calc)    Comment: Reference range: <100 . Desirable range <100 mg/dL for primary prevention;   <70 mg/dL for patients with CHD or diabetic patients  with > or = 2 CHD risk factors. Marland Kitchen LDL-C is now calculated using the Martin-Hopkins  calculation, which is a validated novel method providing  better accuracy than the Friedewald equation in the  estimation of LDL-C.  Horald Pollen et al. Lenox Ahr. 5188;416(60): 2061-2068  (http://education.[facility].com/faq/FAQ164)  Total CHOL/HDL Ratio 2.3 <5.0 (calc)   Non-HDL Cholesterol (Calc) 75 <161 mg/dL (calc)    Comment: For patients with diabetes plus 1 major ASCVD risk  factor, treating to a non-HDL-C goal of <100 mg/dL  (LDL-C of <09 mg/dL) is considered a therapeutic  option.   CBC with Differential/Platelet     Status: None   Collection Time: 08/15/23 10:11 AM  Result Value Ref Range   WBC 4.9 3.8 - 10.8 Thousand/uL   RBC 4.94 4.20 - 5.80 Million/uL   Hemoglobin 15.1 13.2 - 17.1 g/dL   HCT 60.4 54.0 - 98.1 %   MCV 91.7 80.0 - 100.0 fL   MCH 30.6 27.0 - 33.0 pg   MCHC 33.3 32.0 - 36.0 g/dL   RDW 19.1 47.8 - 29.5 %   Platelets 221 140 - 400 Thousand/uL   MPV 11.2 7.5 - 12.5 fL   Neutro Abs 2,862 1,500 - 7,800 cells/uL   Lymphs Abs 1,421 850 - 3,900 cells/uL   Absolute Monocytes 495 200 - 950 cells/uL   Eosinophils Absolute  113 15 - 500 cells/uL   Basophils Absolute 10 0 - 200 cells/uL   Neutrophils Relative % 58.4 %   Total Lymphocyte 29.0 %   Monocytes Relative 10.1 %   Eosinophils Relative 2.3 %   Basophils Relative 0.2 %  VITAMIN D 25 Hydroxy (Vit-D Deficiency, Fractures)     Status: None   Collection Time: 08/15/23 10:11 AM  Result Value Ref Range   Vit D, 25-Hydroxy 78 30 - 100 ng/mL    Comment: Vitamin D Status         25-OH Vitamin D: . Deficiency:                    <20 ng/mL Insufficiency:             20 - 29 ng/mL Optimal:                 > or = 30 ng/mL . For 25-OH Vitamin D testing on patients on  D2-supplementation and patients for whom quantitation  of D2 and D3 fractions is required, the [facility](TM) 25-OH VIT D, (D2,D3), LC/MS/MS is recommended: order  code 62130 (patients >35yrs). . See Note 1 . Note 1 . For additional information, please refer to  http://education.[facility].com/faq/FAQ199  (This link is being provided for informational/ educational purposes only.)       PHQ2/9:    08/15/2023    9:13 AM 06/20/2023   10:13 AM 06/20/2023   10:09 AM 04/14/2023    8:55 AM 01/12/2023    7:47 AM  Depression screen PHQ 2/9  Decreased Interest 0 0 0 0 0  Down, Depressed, Hopeless 0 0 0 0 0  PHQ - 2 Score 0 0 0 0 0  Altered sleeping 0 0  0 0  Tired, decreased energy 0 0  0 0  Change in appetite 0 0  0 0  Feeling bad or failure about yourself  0 0  0 0  Trouble concentrating 0 0  0 0  Moving slowly or fidgety/restless 0 0  0 0  Suicidal thoughts 0 0  0 0  PHQ-9 Score 0 0  0 0    phq 9 is negative   Fall Risk:    11/09/2023    9:39 AM 08/15/2023    9:12 AM 06/20/2023   10:13 AM 04/14/2023    8:54 AM 01/12/2023    7:47 AM  Fall Risk  Falls in the past year? 0 0 0 0 0  Number falls in past yr: 0   0 0  Injury with Fall? 0   0 0  Risk for fall due to : No Fall Risks No Fall Risks No Fall Risks No Fall Risks No Fall Risks  Follow up Education provided;Falls  evaluation completed;Falls prevention discussed Falls prevention discussed Falls prevention discussed;Education provided;Falls evaluation completed Falls prevention discussed Falls prevention discussed    Assessment & Plan  Assessment and Plan    Type 2 Diabetes Mellitus Last A1c was 7.3%, slightly above target. Patient reports improved dietary habits and morning glucose readings of 116-118. Currently on Trijardy XR 25/03/999 and Pioglitazone. -Continue current medications. -Check A1c in 2 months.  Hyperlipidemia LDL is well controlled at 58 on Lovastatin 20mg . -Continue Lovastatin 20mg . -Refill Lovastatin prescription.  History of Hypertension  Blood pressure readings consistently on the lower end of normal without antihypertensive medications. -Removed hypertension from problem list. -Advise patient to stay hydrated and monitor for symptoms of hypotension.  Peripheral Neuropathy Patient reports no current leg pain, currently on Gabapentin. -Continue Gabapentin.  Carpal Tunnel Syndrome Patient reports no current symptoms. -No changes to management.  General Health Maintenance -Continue Vitamin D and baby aspirin. -Abdominal aortic aneurysm screening scheduled for January 20th. -Return in 2 months for follow-up and lab work.

## 2023-11-09 ENCOUNTER — Ambulatory Visit: Payer: Medicare HMO | Admitting: Family Medicine

## 2023-11-09 ENCOUNTER — Encounter: Payer: Self-pay | Admitting: Family Medicine

## 2023-11-09 VITALS — BP 106/64 | HR 112 | Temp 98.2°F | Resp 16 | Ht 65.0 in | Wt 141.4 lb

## 2023-11-09 DIAGNOSIS — G5601 Carpal tunnel syndrome, right upper limb: Secondary | ICD-10-CM | POA: Diagnosis not present

## 2023-11-09 DIAGNOSIS — E1142 Type 2 diabetes mellitus with diabetic polyneuropathy: Secondary | ICD-10-CM | POA: Diagnosis not present

## 2023-11-09 DIAGNOSIS — E559 Vitamin D deficiency, unspecified: Secondary | ICD-10-CM

## 2023-11-09 DIAGNOSIS — Z7984 Long term (current) use of oral hypoglycemic drugs: Secondary | ICD-10-CM

## 2023-11-09 DIAGNOSIS — K219 Gastro-esophageal reflux disease without esophagitis: Secondary | ICD-10-CM

## 2023-11-09 DIAGNOSIS — E785 Hyperlipidemia, unspecified: Secondary | ICD-10-CM

## 2023-11-12 ENCOUNTER — Other Ambulatory Visit: Payer: Self-pay | Admitting: Family Medicine

## 2023-11-12 DIAGNOSIS — E785 Hyperlipidemia, unspecified: Secondary | ICD-10-CM

## 2023-11-12 DIAGNOSIS — E1142 Type 2 diabetes mellitus with diabetic polyneuropathy: Secondary | ICD-10-CM

## 2023-11-13 ENCOUNTER — Other Ambulatory Visit: Payer: Self-pay

## 2023-11-13 DIAGNOSIS — E785 Hyperlipidemia, unspecified: Secondary | ICD-10-CM

## 2023-11-13 DIAGNOSIS — E1142 Type 2 diabetes mellitus with diabetic polyneuropathy: Secondary | ICD-10-CM

## 2023-11-13 MED ORDER — LOVASTATIN 20 MG PO TABS
ORAL_TABLET | ORAL | 1 refills | Status: DC
Start: 2023-11-13 — End: 2024-05-16

## 2023-12-18 ENCOUNTER — Ambulatory Visit
Admission: RE | Admit: 2023-12-18 | Discharge: 2023-12-18 | Disposition: A | Discharge status: Home / Self Care | Payer: Medicare HMO | Source: Ambulatory Visit | Attending: Family Medicine | Admitting: Family Medicine

## 2023-12-18 DIAGNOSIS — Z720 Tobacco use: Secondary | ICD-10-CM | POA: Insufficient documentation

## 2023-12-18 DIAGNOSIS — Z136 Encounter for screening for cardiovascular disorders: Secondary | ICD-10-CM | POA: Insufficient documentation

## 2023-12-18 DIAGNOSIS — F17210 Nicotine dependence, cigarettes, uncomplicated: Secondary | ICD-10-CM | POA: Diagnosis not present

## 2023-12-18 DIAGNOSIS — Z87891 Personal history of nicotine dependence: Secondary | ICD-10-CM | POA: Diagnosis not present

## 2023-12-18 DIAGNOSIS — Z0000 Encounter for general adult medical examination without abnormal findings: Secondary | ICD-10-CM | POA: Insufficient documentation

## 2023-12-20 ENCOUNTER — Other Ambulatory Visit: Payer: Self-pay | Admitting: Family Medicine

## 2023-12-20 DIAGNOSIS — E1142 Type 2 diabetes mellitus with diabetic polyneuropathy: Secondary | ICD-10-CM

## 2023-12-20 NOTE — Telephone Encounter (Signed)
Last follow up 11/09/23

## 2023-12-21 ENCOUNTER — Other Ambulatory Visit: Payer: Self-pay | Admitting: Family Medicine

## 2023-12-21 DIAGNOSIS — E1142 Type 2 diabetes mellitus with diabetic polyneuropathy: Secondary | ICD-10-CM

## 2023-12-26 ENCOUNTER — Telehealth: Payer: Self-pay | Admitting: Family Medicine

## 2023-12-26 NOTE — Telephone Encounter (Signed)
Copied from CRM [id number]. Topic: General - Other >> Dec 26, 2023  3:53 PM Ja-Kwan M wrote: Reason for CRM: Pt spouse stated that they were speaking with a nurse in the office about pt medication and they need to speak back to that nurse. Cb# [phone]

## 2023-12-26 NOTE — Telephone Encounter (Signed)
Please send in an alternative pt does not want to pay the remaining balance out of pocket.

## 2023-12-26 NOTE — Telephone Encounter (Signed)
Copied from CRM [id number]. Topic: General - Other >> Dec 26, 2023 11:12 AM Haroldine Laws wrote: Reason for CRM: pt called saying his diabetes medication has went from 135.00 to over 300.00.  he wants to know if he can have a different medication.  He is suppose to pick it up today.  [phone]

## 2023-12-26 NOTE — Telephone Encounter (Signed)
Pt states Empagliflozin-Linaglip-Metform (TRIJARDY XR) 25-03-999 MG TB24 is now higher cost for him and would like alternative. I advised pt to contact insurance and ask what alternatives medications will they cover for him in better cost. Pt will get back with [place] once he speaks to insurance.

## 2023-12-26 NOTE — Telephone Encounter (Signed)
Pt is calling in to see if there is an update on the request regarding his medication not being affordable. Pt is requesting a call back.

## 2023-12-26 NOTE — Telephone Encounter (Signed)
A different encounter was created will continue to document the other one.

## 2023-12-26 NOTE — Telephone Encounter (Signed)
Notified pt spouse.

## 2024-01-08 DIAGNOSIS — E119 Type 2 diabetes mellitus without complications: Secondary | ICD-10-CM | POA: Diagnosis not present

## 2024-01-08 DIAGNOSIS — H40053 Ocular hypertension, bilateral: Secondary | ICD-10-CM | POA: Diagnosis not present

## 2024-01-08 DIAGNOSIS — H2511 Age-related nuclear cataract, right eye: Secondary | ICD-10-CM | POA: Diagnosis not present

## 2024-01-08 DIAGNOSIS — H26132 Total traumatic cataract, left eye: Secondary | ICD-10-CM | POA: Diagnosis not present

## 2024-01-08 LAB — HM DIABETES EYE EXAM

## 2024-01-10 ENCOUNTER — Ambulatory Visit: Payer: Medicare HMO | Admitting: Family Medicine

## 2024-01-10 ENCOUNTER — Encounter: Payer: Self-pay | Admitting: Family Medicine

## 2024-01-10 VITALS — BP 118/68 | HR 105 | Temp 97.9°F | Resp 16 | Ht 65.0 in | Wt 141.4 lb

## 2024-01-10 DIAGNOSIS — E785 Hyperlipidemia, unspecified: Secondary | ICD-10-CM

## 2024-01-10 DIAGNOSIS — Z7984 Long term (current) use of oral hypoglycemic drugs: Secondary | ICD-10-CM

## 2024-01-10 DIAGNOSIS — R000 Tachycardia, unspecified: Secondary | ICD-10-CM | POA: Diagnosis not present

## 2024-01-10 DIAGNOSIS — E1142 Type 2 diabetes mellitus with diabetic polyneuropathy: Secondary | ICD-10-CM | POA: Diagnosis not present

## 2024-01-10 DIAGNOSIS — M545 Low back pain, unspecified: Secondary | ICD-10-CM | POA: Diagnosis not present

## 2024-01-10 DIAGNOSIS — E559 Vitamin D deficiency, unspecified: Secondary | ICD-10-CM

## 2024-01-10 DIAGNOSIS — K219 Gastro-esophageal reflux disease without esophagitis: Secondary | ICD-10-CM | POA: Diagnosis not present

## 2024-01-10 LAB — POCT GLYCOSYLATED HEMOGLOBIN (HGB A1C): Hemoglobin A1C: 7.1 % — AB (ref 4.0–5.6)

## 2024-01-10 NOTE — Progress Notes (Signed)
Name: John Howard   MRN: [id number]    DOB: Apr 03, 1956   Date:01/10/2024       Progress Note  Subjective  Chief Complaint  Chief Complaint  Patient presents with   Medical Management of Chronic Issues   HPI   DMII: he is complaints with medication, he has been taking Empagliforzin-Linaglip-metformin 25-03-999 daily and also Actos . He denies hypoglycemic episodes. No polyphagia, polydipsia or polyuria.. He has changed his diet since last visit, avoiding a lot of peanut butter crackers  Fasting glucose between 100-139 occasionally goes up to 150. A1C is down from 7.3 % to 7.1 %   Cost of medication went up , we will try to get pharmacy to help with cost. See if he qualifies He has neuropathy on both legs  he is back on gabapentin at night He also has dyslipidemia and is taking statin therapy and LDL has been at goal    Hyperlipidemia: last LDL was 58, continue statin therapy, no myalgias .   Vitamin D deficiency: taking otc supplementation, can cut down on supplementation   Carpal Tunnels syndrome right: he is taking gabapentin again at night and seems to help with symptoms of burning. Not as bad since he retired in April 2024   GERD: he states that he has a mild cough after he eats, no heartburn or indigestion.He states symptoms are mild and intermittent, does not want to start medication at this time   Intermittent low back pain: mild symptoms no radiculitis   Tachycardia: second visit that heart rate is elevated, he denies sob, palpitation or chest pain. We will check TSH, CBC and EKG today His bp is towards low end of normal but he does not take any bp medications  Patient Active Problem List   Diagnosis Date Noted   Intermittent low back pain 05/12/2022   GERD without esophagitis 05/12/2022   Carpal tunnel syndrome of right wrist 06/26/2018   Tobacco use 02/28/2018   Osteoarthritis of right hand 02/28/2018   Chronic right shoulder pain 11/23/2016   Vitamin D deficiency  05/27/2016   Second degree hemorrhoids    Type 2 diabetes mellitus with polyneuropathy (HCC) 11/25/2015   Dyslipidemia 06/23/2015   Blind left eye 06/23/2015    Past Surgical History:  Procedure Laterality Date   COLONOSCOPY     COLONOSCOPY WITH PROPOFOL N/A 03/21/2016   Procedure: COLONOSCOPY WITH PROPOFOL;  Surgeon: Midge Minium, MD;  Location: [facility];  Service: Endoscopy;  Laterality: N/A;  Diabetic - insulin   EYE SURGERY      Family History  Problem Relation Age of Onset   Hypertension Mother    Diabetes Mother    Diabetes Maternal Grandmother    Heart attack Maternal Grandfather     Social History   Tobacco Use   Smoking status: Some Days    Current packs/day: 0.00    Average packs/day: 0.2 packs/day for 44.2 years (11.0 ttl pk-yrs)    Types: Cigarettes    Start date: 11/28/1974    Last attempt to quit: 02/07/2019    Years since quitting: 4.9   Smokeless tobacco: Never   Tobacco comments:    he has been smoking a few times a week again  Substance Use Topics   Alcohol use: Not Currently    Comment: pt is trying to quit, has not had a drink in a month     Current Outpatient Medications:    ACCU-CHEK GUIDE TEST test strip, 1 EACH BY IN  VITRO ROUTE 2 (TWO) TIMES DAILY., Disp: 100 strip, Rfl: 1   Accu-Chek Softclix Lancets lancets, 2 (two) times daily. as directed, Disp: , Rfl:    aspirin 81 MG tablet, Take 81 mg by mouth daily., Disp: , Rfl:    Blood Glucose Monitoring Suppl (ACCU-CHEK GUIDE ME) w/Device KIT, USE IN THE MORNING, AT NOON, AND AT BEDTIME., Disp: , Rfl:    Blood Glucose Monitoring Suppl (ACCU-CHEK GUIDE ME) w/Device KIT, USE IN THE MORNING, AT NOON, AND AT BEDTIME., Disp: 1 kit, Rfl: 0   Cholecalciferol (VITAMIN D PO), Take 25 mg by mouth., Disp: , Rfl:    Empagliflozin-Linaglip-Metform (TRIJARDY XR) 25-03-999 MG TB24, Take 1 tablet by mouth daily., Disp: 90 tablet, Rfl: 1   gabapentin (NEURONTIN) 400 MG capsule, TAKE 1 CAPSULE(400 MG) BY  MOUTH AT BEDTIME, Disp: 90 capsule, Rfl: 1   lovastatin (MEVACOR) 20 MG tablet, TAKE 1 TABLET(20 MG) BY MOUTH AT BEDTIME, Disp: 90 tablet, Rfl: 1   pioglitazone (ACTOS) 15 MG tablet, TAKE 1 TABLET (15 MG TOTAL) BY MOUTH DAILY., Disp: 90 tablet, Rfl: 0  No Known Allergies  I personally reviewed active problem list, medication list, allergies with the patient/caregiver today.   ROS  Ten systems reviewed and is negative except as mentioned in HPI    Objective  Vitals:   01/10/24 0849 01/10/24 0850  BP: 118/68   Pulse: (!) 111 (!) 105  Resp: 16   Temp: 97.9 F (36.6 C)   TempSrc: Oral   SpO2: 95%   Weight: 141 lb 6.4 oz (64.1 kg)   Height: 5\' 5"  (1.651 m)     Body mass index is 23.53 kg/m.  Physical Exam  Constitutional: Patient appears well-developed and well-nourished.  No distress.  HEENT: head atraumatic, normocephalic, pupils equal and reactive to light, neck supple Cardiovascular: Normal rate, regular rhythm and normal heart sounds.  No murmur heard. No BLE edema. Pulmonary/Chest: Effort normal and breath sounds normal. No respiratory distress. Abdominal: Soft.  There is no tenderness. Psychiatric: Patient has a normal mood and affect. behavior is normal. Judgment and thought content normal.   Recent Results (from the past 2160 hours)  POCT glycosylated hemoglobin (Hb A1C)     Status: Abnormal   Collection Time: 01/10/24  8:52 AM  Result Value Ref Range   Hemoglobin A1C 7.1 (A) 4.0 - 5.6 %   HbA1c POC (<> result, manual entry)     HbA1c, POC (prediabetic range)     HbA1c, POC (controlled diabetic range)      Diabetic Foot Exam:     PHQ2/9:    01/10/2024    8:41 AM 08/15/2023    9:13 AM 06/20/2023   10:13 AM 06/20/2023   10:09 AM 04/14/2023    8:55 AM  Depression screen PHQ 2/9  Decreased Interest 0 0 0 0 0  Down, Depressed, Hopeless 0 0 0 0 0  PHQ - 2 Score 0 0 0 0 0  Altered sleeping 0 0 0  0  Tired, decreased energy 0 0 0  0  Change in appetite 0 0 0   0  Feeling bad or failure about yourself  0 0 0  0  Trouble concentrating 0 0 0  0  Moving slowly or fidgety/restless 0 0 0  0  Suicidal thoughts 0 0 0  0  PHQ-9 Score 0 0 0  0  Difficult doing work/chores Not difficult at all        phq 9 is negative  Fall Risk:    01/10/2024    8:41 AM 11/09/2023    9:39 AM 08/15/2023    9:12 AM 06/20/2023   10:13 AM 04/14/2023    8:54 AM  Fall Risk   Falls in the past year? 0 0 0 0 0  Number falls in past yr: 0 0   0  Injury with Fall? 0 0   0  Risk for fall due to : No Fall Risks No Fall Risks No Fall Risks No Fall Risks No Fall Risks  Follow up Falls prevention discussed;Education provided;Falls evaluation completed Education provided;Falls evaluation completed;Falls prevention discussed Falls prevention discussed Falls prevention discussed;Education provided;Falls evaluation completed Falls prevention discussed     Assessment & Plan   1. Type 2 diabetes mellitus with polyneuropathy (HCC) (Primary)  - POCT glycosylated hemoglobin (Hb A1C) - AMB Referral [facility] Care Management to assist with cost of DM medication  2. GERD without esophagitis  stable  3. Intermittent low back pain  stable  4. Vitamin D insufficiency  Cut down on supplementation  5. Dyslipidemia  Continue Mevacor  6. Tachycardia  - EKG 12-Lead - sinus rhythm but heart rate higher than his baseline of 60-80's, we will check labs and monitor. Cannot add beta blocker since bp is low   - TSH - CBC with Differential/Platelet

## 2024-01-11 LAB — CBC WITH DIFFERENTIAL/PLATELET
Absolute Lymphocytes: 1323 {cells}/uL (ref 850–3900)
Absolute Monocytes: 466 {cells}/uL (ref 200–950)
Basophils Absolute: 29 {cells}/uL (ref 0–200)
Basophils Relative: 0.6 %
Eosinophils Absolute: 103 {cells}/uL (ref 15–500)
Eosinophils Relative: 2.1 %
HCT: 47.5 % (ref 38.5–50.0)
Hemoglobin: 15.6 g/dL (ref 13.2–17.1)
MCH: 30.4 pg (ref 27.0–33.0)
MCHC: 32.8 g/dL (ref 32.0–36.0)
MCV: 92.4 fL (ref 80.0–100.0)
MPV: 10.8 fL (ref 7.5–12.5)
Monocytes Relative: 9.5 %
Neutro Abs: 2979 {cells}/uL (ref 1500–7800)
Neutrophils Relative %: 60.8 %
Platelets: 218 10*3/uL (ref 140–400)
RBC: 5.14 10*6/uL (ref 4.20–5.80)
RDW: 12.3 % (ref 11.0–15.0)
Total Lymphocyte: 27 %
WBC: 4.9 10*3/uL (ref 3.8–10.8)

## 2024-01-11 LAB — TSH: TSH: 1.41 m[IU]/L (ref 0.40–4.50)

## 2024-01-12 ENCOUNTER — Other Ambulatory Visit: Payer: Self-pay | Admitting: Pharmacist

## 2024-01-12 DIAGNOSIS — E1142 Type 2 diabetes mellitus with diabetic polyneuropathy: Secondary | ICD-10-CM

## 2024-01-12 NOTE — Progress Notes (Signed)
01/12/2024 Name: John Howard MRN: [id number] DOB: August 08, 1956  Chief Complaint  Patient presents with   Medication Assistance   Medication Management    John Howard is a 68 y.o. year old male who presented for a telephone visit.   They were referred to the pharmacist by their PCP for assistance in managing medication access.    Subjective:  Care Team: Primary Care Provider: Alba Cory, MD ; Next Scheduled Visit: 05/09/2024   Medication Access/Adherence  Current Pharmacy:  [facility] [address], [place] - [address] [place] [address] Phone: [phone] Fax: [phone]   Patient reports affordability concerns with their medications: Yes  Patient reports access/transportation concerns to their pharmacy: No  Patient reports adherence concerns with their medications:  No    Reports when last picked up his Trijardy XR from his [facility], the cost was $391. Reports picked up a 90 day supply.  Diabetes:  Current medications:  - Trijardy XR 25-03-999 mg daily - pioglitazone 15 mg daily  Current glucose readings: today before breakfast ~120  Statin therapy: lovastatin 20 mg daily at bedtime    Objective:  Lab Results  Component Value Date   HGBA1C 7.1 (A) 01/10/2024    Lab Results  Component Value Date   CREATININE 0.99 08/15/2023   BUN 21 08/15/2023   NA 137 08/15/2023   K 4.6 08/15/2023   CL 102 08/15/2023   CO2 25 08/15/2023    Lab Results  Component Value Date   CHOL 134 08/15/2023   HDL 59 08/15/2023   LDLCALC 58 08/15/2023   TRIG 89 08/15/2023   CHOLHDL 2.3 08/15/2023    Medications Reviewed Today     Reviewed by Manuela Neptune, RPH-CPP (Pharmacist) on 01/12/24 at 1136  Med List Status: <None>   Medication Order Taking? Sig Documenting Provider Last Dose Status Informant  ACCU-CHEK GUIDE TEST test strip [id number] No 1 EACH BY IN VITRO ROUTE 2 (TWO) TIMES DAILY. Alba Cory, MD Taking  Active   Accu-Chek Softclix Lancets lancets [id number] No 2 (two) times daily. as directed [provider] Taking Active   aspirin 81 MG tablet [id number] No Take 81 mg by mouth daily. [provider] Taking Active   Blood Glucose Monitoring Suppl (ACCU-CHEK GUIDE ME) w/Device KIT [id number] No USE IN THE MORNING, AT NOON, AND AT BEDTIME. [provider] Taking Active   Blood Glucose Monitoring Suppl (ACCU-CHEK GUIDE ME) w/Device KIT [id number] No USE IN THE MORNING, AT NOON, AND AT BEDTIME. Alba Cory, MD Taking Active   Cholecalciferol (VITAMIN D PO) [id number] No Take 25 mg by mouth. [provider] Taking Active Self  Empagliflozin-Linaglip-Metform (TRIJARDY XR) 25-03-999 MG TB24 [id number] No Take 1 tablet by mouth daily. Alba Cory, MD Taking Active   gabapentin (NEURONTIN) 400 MG capsule [id number] No TAKE 1 CAPSULE(400 MG) BY MOUTH AT BEDTIME Alba Cory, MD Taking Active   lovastatin (MEVACOR) 20 MG tablet [id number] No TAKE 1 TABLET(20 MG) BY MOUTH AT BEDTIME Alba Cory, MD Taking Active   pioglitazone (ACTOS) 15 MG tablet [id number] No TAKE 1 TABLET (15 MG TOTAL) BY MOUTH DAILY. Alba Cory, MD Taking Active               Assessment/Plan:   Review patient's prescription plan information from [facility] website and note patient has a $250 annual deductible for prescription tiers 3-5. Patient reports that he paid $391 when he picked up the Trijardy XR last month (  deductible + $141 tier 3 copayment for 90 day supply). Note now that annual deductible is met, patient's cost for Trijardy XR should be $47 for 1 month supply or $141 for 3 month supply for remainder of current calendar year - Patient states this cost is affordable to him  Discuss criteria for patient assistance program for Trijardy XR from [facility]. Patient states that he does not wish to apply at this time, but will let me know if would like to in the  future  Diabetes: - Reviewed dietary modifications including importance of having regular well-balanced meals and snacks throughout the day, while controlling carbohydrate portion sizes - Recommend to check glucose, keep log of results and have this record to review at upcoming medical appointments. Patient to contact provider office sooner if needed for readings outside of established parameters or symptoms  Follow Up Plan:   Patient denies further medication questions or concerns today Provide patient with contact information for clinic pharmacist to contact if needed in future for medication questions/concerns   Estelle Grumbles, PharmD, [facility] [phone]

## 2024-01-12 NOTE — Patient Instructions (Signed)
Goals Addressed             This Visit's Progress    Pharmacy Goals       The goal A1c is less than 7%. This is the best way to reduce the risk of the long term complications of diabetes, including heart disease, kidney disease, eye disease, strokes, and nerve damage. An A1c of less than 7% corresponds with fasting sugars less than 130 and 2 hour after meal sugars less than 180. Please check your blood sugar daily.   Our goal bad cholesterol, or LDL, is less than 70. This is why it is important to continue taking your lovastatin.  Estelle Grumbles, PharmD, [facility] [phone]

## 2024-02-28 ENCOUNTER — Telehealth: Payer: Self-pay | Admitting: Family Medicine

## 2024-02-28 NOTE — Telephone Encounter (Signed)
 I called Shanda Bumps back and she stated patient wanted the referral for his imaging to be sent to the scheduling department. Patient had already done the [place] in January but he stated due to insurance purposes for coverage. Do you know anything about it? Dr.Sowles placed the imaging order but not a referral.

## 2024-02-28 NOTE — Telephone Encounter (Signed)
 Copied from CRM [id number]. Topic: Referral - Status >> Feb 28, 2024 10:16 AM Lorin Glass B wrote: Reason for CRM: [facility] Bumps [facility] states that patients referral information can be sent to Scheduling Dept: Fax #[phone], Phone #[phone]. Patient has already had the imaging done but the referral information to send over is for insurance purposes only. Jessica's callback for any questions: [phone]

## 2024-03-16 ENCOUNTER — Other Ambulatory Visit: Payer: Self-pay | Admitting: Family Medicine

## 2024-03-16 DIAGNOSIS — E1142 Type 2 diabetes mellitus with diabetic polyneuropathy: Secondary | ICD-10-CM

## 2024-04-01 ENCOUNTER — Other Ambulatory Visit: Payer: Self-pay | Admitting: Family Medicine

## 2024-04-01 DIAGNOSIS — G5601 Carpal tunnel syndrome, right upper limb: Secondary | ICD-10-CM

## 2024-04-01 DIAGNOSIS — E785 Hyperlipidemia, unspecified: Secondary | ICD-10-CM

## 2024-04-01 DIAGNOSIS — E1142 Type 2 diabetes mellitus with diabetic polyneuropathy: Secondary | ICD-10-CM

## 2024-05-03 ENCOUNTER — Other Ambulatory Visit: Payer: Self-pay | Admitting: Family Medicine

## 2024-05-03 DIAGNOSIS — G5601 Carpal tunnel syndrome, right upper limb: Secondary | ICD-10-CM

## 2024-05-03 DIAGNOSIS — E1142 Type 2 diabetes mellitus with diabetic polyneuropathy: Secondary | ICD-10-CM

## 2024-05-03 DIAGNOSIS — E785 Hyperlipidemia, unspecified: Secondary | ICD-10-CM

## 2024-05-09 ENCOUNTER — Ambulatory Visit: Payer: Medicare HMO | Admitting: Family Medicine

## 2024-05-14 ENCOUNTER — Ambulatory Visit: Admitting: Family Medicine

## 2024-05-16 ENCOUNTER — Ambulatory Visit: Admitting: Family Medicine

## 2024-05-16 ENCOUNTER — Encounter: Payer: Self-pay | Admitting: Family Medicine

## 2024-05-16 VITALS — BP 114/68 | HR 81 | Resp 16 | Ht 65.0 in | Wt 139.7 lb

## 2024-05-16 DIAGNOSIS — Z7984 Long term (current) use of oral hypoglycemic drugs: Secondary | ICD-10-CM | POA: Diagnosis not present

## 2024-05-16 DIAGNOSIS — M545 Low back pain, unspecified: Secondary | ICD-10-CM | POA: Diagnosis not present

## 2024-05-16 DIAGNOSIS — E785 Hyperlipidemia, unspecified: Secondary | ICD-10-CM

## 2024-05-16 DIAGNOSIS — E559 Vitamin D deficiency, unspecified: Secondary | ICD-10-CM

## 2024-05-16 DIAGNOSIS — R000 Tachycardia, unspecified: Secondary | ICD-10-CM | POA: Insufficient documentation

## 2024-05-16 DIAGNOSIS — G5601 Carpal tunnel syndrome, right upper limb: Secondary | ICD-10-CM

## 2024-05-16 DIAGNOSIS — E1142 Type 2 diabetes mellitus with diabetic polyneuropathy: Secondary | ICD-10-CM | POA: Diagnosis not present

## 2024-05-16 LAB — POCT GLYCOSYLATED HEMOGLOBIN (HGB A1C): Hemoglobin A1C: 7.5 % — AB (ref 4.0–5.6)

## 2024-05-16 MED ORDER — LOVASTATIN 20 MG PO TABS
ORAL_TABLET | ORAL | 1 refills | Status: DC
Start: 2024-05-16 — End: 2024-09-16

## 2024-05-16 MED ORDER — PIOGLITAZONE HCL 15 MG PO TABS
15.0000 mg | ORAL_TABLET | Freq: Every day | ORAL | 1 refills | Status: DC
Start: 2024-05-16 — End: 2024-09-16

## 2024-05-16 MED ORDER — METFORMIN HCL ER 750 MG PO TB24: 750.0000 mg | ORAL_TABLET | Freq: Every evening | ORAL | 1 refills | Status: DC

## 2024-05-16 MED ORDER — TRIJARDY XR 25-5-1000 MG PO TB24: 1.0000 | ORAL_TABLET | Freq: Every day | ORAL | 1 refills | Status: DC

## 2024-05-16 MED ORDER — GABAPENTIN 400 MG PO CAPS: 400.0000 mg | ORAL_CAPSULE | Freq: Every day | ORAL | 1 refills | Status: DC

## 2024-05-16 NOTE — Progress Notes (Unsigned)
 Name: John Howard   MRN: [id number]    DOB: 04/14/1956   Date:05/16/2024       Progress Note  Subjective  Chief Complaint  Chief Complaint  Patient presents with   Medical Management of Chronic Issues   {HPI:32069} ***  DMII: he is complaints with medication, he has been taking Empagliforzin-Linaglip-metformin  25-03-999 daily and also Actos  . He denies hypoglycemic episodes. No polyphagia, polydipsia or polyuria.. He has changed his diet since last visit, avoiding a lot of peanut butter crackers  Fasting glucose between 100-139 occasionally goes up to 150. A1C is down from 7.3 % to 7.1 %   Cost of medication went up , we will try to get pharmacy to help with cost. See if he qualifies He has neuropathy on both legs  he is back on gabapentin  at night He also has dyslipidemia and is taking statin therapy and LDL has been at goal    Hyperlipidemia: last LDL was 58, continue statin therapy, no myalgias .   Vitamin D  deficiency: taking otc supplementation, can cut down on supplementation   Carpal Tunnels syndrome right: he is taking gabapentin  again at night and seems to help with symptoms of burning. Not as bad since he retired in April 2024   GERD: he states that he has a mild cough after he eats, no heartburn or indigestion.He states symptoms are mild and intermittent, does not want to start medication at this time   Intermittent low back pain: mild symptoms no radiculitis    Tachycardia: second visit that heart rate is elevated, he denies sob, palpitation or chest pain. We will check TSH, CBC and EKG today His bp is towards low end of normal but he does not take any bp medications  Patient Active Problem List   Diagnosis Date Noted   Intermittent low back pain 05/12/2022   GERD without esophagitis 05/12/2022   Carpal tunnel syndrome of right wrist 06/26/2018   Tobacco use 02/28/2018   Osteoarthritis of right hand 02/28/2018   Chronic right shoulder pain 11/23/2016   Vitamin D   deficiency 05/27/2016   Second degree hemorrhoids    Type 2 diabetes mellitus with polyneuropathy (HCC) 11/25/2015   Dyslipidemia 06/23/2015   Blind left eye 06/23/2015    Past Surgical History:  Procedure Laterality Date   COLONOSCOPY     COLONOSCOPY WITH PROPOFOL  N/A 03/21/2016   Procedure: COLONOSCOPY WITH PROPOFOL ;  Surgeon: Marnee Sink, MD;  Location: [facility];  Service: Endoscopy;  Laterality: N/A;  Diabetic - insulin    EYE SURGERY      Family History  Problem Relation Age of Onset   Hypertension Mother    Diabetes Mother    Diabetes Maternal Grandmother    Heart attack Maternal Grandfather     Social History   Tobacco Use   Smoking status: Some Days    Current packs/day: 0.00    Average packs/day: 0.2 packs/day for 44.2 years (11.0 ttl pk-yrs)    Types: Cigarettes    Start date: 11/28/1974    Last attempt to quit: 02/07/2019    Years since quitting: 5.2   Smokeless tobacco: Never   Tobacco comments:    he has been smoking a few times a week again  Substance Use Topics   Alcohol use: Not Currently    Comment: pt is trying to quit, has not had a drink in a month     Current Outpatient Medications:    ACCU-CHEK GUIDE TEST test strip, 1 EACH BY  IN VITRO ROUTE 2 (TWO) TIMES DAILY., Disp: 100 strip, Rfl: 1   Accu-Chek Softclix Lancets lancets, 2 (two) times daily. as directed, Disp: , Rfl:    aspirin 81 MG tablet, Take 81 mg by mouth daily., Disp: , Rfl:    Blood Glucose Monitoring Suppl (ACCU-CHEK GUIDE ME) w/Device KIT, USE IN THE MORNING, AT NOON, AND AT BEDTIME., Disp: , Rfl:    Blood Glucose Monitoring Suppl (ACCU-CHEK GUIDE ME) w/Device KIT, USE IN THE MORNING, AT NOON, AND AT BEDTIME., Disp: 1 kit, Rfl: 0   Cholecalciferol (VITAMIN D  PO), Take 25 mg by mouth., Disp: , Rfl:    Empagliflozin -Linaglip-Metform (TRIJARDY  XR) 25-03-999 MG TB24, TAKE 1 TABLET BY MOUTH EVERY DAY, Disp: 90 tablet, Rfl: 0   gabapentin  (NEURONTIN ) 400 MG capsule, TAKE 1  CAPSULE(400 MG) BY MOUTH AT BEDTIME, Disp: 90 capsule, Rfl: 0   lovastatin  (MEVACOR ) 20 MG tablet, TAKE 1 TABLET(20 MG) BY MOUTH AT BEDTIME, Disp: 90 tablet, Rfl: 1   pioglitazone  (ACTOS ) 15 MG tablet, TAKE 1 TABLET (15 MG TOTAL) BY MOUTH DAILY., Disp: 90 tablet, Rfl: 0  No Known Allergies  I personally reviewed active problem list, medication list, allergies with the patient/caregiver today.   ROS  Ten systems reviewed and is negative except as mentioned in HPI    Objective Physical Exam Constitutional: Patient appears well-developed and well-nourished.  No distress.  HEENT: head atraumatic, normocephalic, pupils equal and reactive to light, neck supple Cardiovascular: Normal rate, regular rhythm and normal heart sounds.  No murmur heard. No BLE edema. Pulmonary/Chest: Effort normal and breath sounds normal. No respiratory distress. Abdominal: Soft.  There is no tenderness. Psychiatric: Patient has a normal mood and affect. behavior is normal. Judgment and thought content normal.   Vitals:   05/16/24 1434  BP: 114/68  Pulse: 81  Resp: 16  SpO2: 99%  Weight: 139 lb 11.2 oz (63.4 kg)  Height: 5' 5 (1.651 m)    Body mass index is 23.25 kg/m.    PHQ2/9:    05/16/2024    2:30 PM 01/10/2024    8:41 AM 08/15/2023    9:13 AM 06/20/2023   10:13 AM 06/20/2023   10:09 AM  Depression screen PHQ 2/9  Decreased Interest 0 0 0 0 0  Down, Depressed, Hopeless 0 0 0 0 0  PHQ - 2 Score 0 0 0 0 0  Altered sleeping  0 0 0   Tired, decreased energy  0 0 0   Change in appetite  0 0 0   Feeling bad or failure about yourself   0 0 0   Trouble concentrating  0 0 0   Moving slowly or fidgety/restless  0 0 0   Suicidal thoughts  0 0 0   PHQ-9 Score  0 0 0   Difficult doing work/chores  Not difficult at all       phq 9 is negative  Fall Risk:    05/16/2024    2:30 PM 01/10/2024    8:41 AM 11/09/2023    9:39 AM 08/15/2023    9:12 AM 06/20/2023   10:13 AM  Fall Risk   Falls in the  past year? 0 0 0 0 0  Number falls in past yr: 0 0 0    Injury with Fall? 0 0 0    Risk for fall due to : No Fall Risks No Fall Risks No Fall Risks No Fall Risks No Fall Risks  Follow up Falls prevention discussed;Education  provided;Falls evaluation completed Falls prevention discussed;Education provided;Falls evaluation completed Education provided;Falls evaluation completed;Falls prevention discussed Falls prevention discussed Falls prevention discussed;Education provided;Falls evaluation completed     {A&P:32071}

## 2024-06-04 ENCOUNTER — Other Ambulatory Visit: Payer: Self-pay | Admitting: Family Medicine

## 2024-06-04 DIAGNOSIS — E1142 Type 2 diabetes mellitus with diabetic polyneuropathy: Secondary | ICD-10-CM

## 2024-07-31 DIAGNOSIS — E119 Type 2 diabetes mellitus without complications: Secondary | ICD-10-CM | POA: Diagnosis not present

## 2024-08-01 LAB — LAB REPORT - SCANNED
Albumin, Urine POC: <0.2
Creatinine, POC: 41 mg/dL
EGFR: 70

## 2024-08-30 NOTE — Progress Notes (Signed)
 KHAM ZUCKERMAN                                          MRN: [id number]   08/30/2024   The VBCI Quality Team Specialist reviewed this patient medical record for the purposes of chart review for care gap closure. The following were reviewed: abstraction for care gap closure-kidney health evaluation for diabetes:eGFR  and uACR.    VBCI Quality Team

## 2024-09-16 ENCOUNTER — Encounter: Payer: Self-pay | Admitting: Family Medicine

## 2024-09-16 ENCOUNTER — Ambulatory Visit: Admitting: Family Medicine

## 2024-09-16 VITALS — BP 132/70 | HR 91 | Resp 16 | Ht 65.0 in | Wt 143.0 lb

## 2024-09-16 DIAGNOSIS — E1169 Type 2 diabetes mellitus with other specified complication: Secondary | ICD-10-CM

## 2024-09-16 DIAGNOSIS — E559 Vitamin D deficiency, unspecified: Secondary | ICD-10-CM | POA: Diagnosis not present

## 2024-09-16 DIAGNOSIS — Z79899 Other long term (current) drug therapy: Secondary | ICD-10-CM

## 2024-09-16 DIAGNOSIS — E1142 Type 2 diabetes mellitus with diabetic polyneuropathy: Secondary | ICD-10-CM | POA: Diagnosis not present

## 2024-09-16 DIAGNOSIS — G5601 Carpal tunnel syndrome, right upper limb: Secondary | ICD-10-CM

## 2024-09-16 DIAGNOSIS — Z7984 Long term (current) use of oral hypoglycemic drugs: Secondary | ICD-10-CM | POA: Diagnosis not present

## 2024-09-16 DIAGNOSIS — Z23 Encounter for immunization: Secondary | ICD-10-CM

## 2024-09-16 DIAGNOSIS — E785 Hyperlipidemia, unspecified: Secondary | ICD-10-CM

## 2024-09-16 LAB — POCT GLYCOSYLATED HEMOGLOBIN (HGB A1C): Hemoglobin A1C: 4.7 % (ref 4.0–5.6)

## 2024-09-16 MED ORDER — LOVASTATIN 20 MG PO TABS
ORAL_TABLET | ORAL | 1 refills | Status: AC
Start: 2024-09-16 — End: ?

## 2024-09-16 MED ORDER — GABAPENTIN 400 MG PO CAPS: 400.0000 mg | ORAL_CAPSULE | Freq: Every day | ORAL | 1 refills | Status: AC

## 2024-09-16 MED ORDER — TRIJARDY XR 25-5-1000 MG PO TB24: 1.0000 | ORAL_TABLET | Freq: Every day | ORAL | 1 refills | Status: AC

## 2024-09-16 MED ORDER — PIOGLITAZONE HCL 15 MG PO TABS: 15.0000 mg | ORAL_TABLET | Freq: Every day | ORAL | 1 refills | Status: AC

## 2024-09-16 NOTE — Progress Notes (Signed)
 Name: John Howard   MRN: [id number]    DOB: 1956-08-26   Date:09/16/2024       Progress Note  Subjective  Chief Complaint  Chief Complaint  Patient presents with   Medical Management of Chronic Issues   Discussed the use of AI scribe software for clinical note transcription with the patient, who gave verbal consent to proceed.  History of Present Illness John Howard is a 68 year old male with diabetes who presents for a follow-up visit.  He has experienced a significant improvement in diabetes management, with his A1c dropping from 7.5% to 4.7%. This improvement is attributed to dietary changes and increased physical activity, specifically eliminating peanut butter with added sugar and walking five miles daily since last year. He is currently taking pioglitazone  and metformin  750 mg that was added during his last visit to [facility]  since A1C was not at goal and gabapentin  for polyneuropathy, which is well-controlled with no current pain in his feet.[name]   He takes lovastatin  for cholesterol management and reports no side effects such as muscle aches. He also takes vitamin D  over the counter due to previously low levels and is due for a recheck.   He used to have HTN but that has resolved and no longer taking medications     Patient Active Problem List   Diagnosis Date Noted   Tachycardia 05/16/2024   Intermittent low back pain 05/12/2022   Carpal tunnel syndrome of right wrist 06/26/2018   Tobacco use 02/28/2018   Osteoarthritis of right hand 02/28/2018   Chronic right shoulder pain 11/23/2016   Vitamin D  deficiency 05/27/2016   Second degree hemorrhoids    Type 2 diabetes mellitus with polyneuropathy (HCC) 11/25/2015   Dyslipidemia 06/23/2015   Blind left eye 06/23/2015    Past Surgical History:  Procedure Laterality Date   COLONOSCOPY     COLONOSCOPY WITH PROPOFOL  N/A 03/21/2016   Procedure: COLONOSCOPY WITH PROPOFOL ;  Surgeon: Rogelia Copping, MD;  Location: [facility];  Service: Endoscopy;  Laterality: N/A;  Diabetic - insulin    EYE SURGERY      Family History  Problem Relation Age of Onset   Hypertension Mother    Diabetes Mother    Diabetes Maternal Grandmother    Heart attack Maternal Grandfather     Social History   Tobacco Use   Smoking status: Some Days    Current packs/day: 0.00    Average packs/day: 0.2 packs/day for 44.2 years (11.0 ttl pk-yrs)    Types: Cigarettes    Start date: 11/28/1974    Last attempt to quit: 02/07/2019    Years since quitting: 5.6   Smokeless tobacco: Never   Tobacco comments:    he has been smoking a few times a week again  Substance Use Topics   Alcohol use: Not Currently    Comment: pt is trying to quit, has not had a drink in a month     Current Outpatient Medications:    ACCU-CHEK GUIDE TEST test strip, 1 EACH BY IN VITRO ROUTE 2 (TWO) TIMES DAILY., Disp: 100 strip, Rfl: 1   Accu-Chek Softclix Lancets lancets, 2 (two) times daily. as directed, Disp: , Rfl:    aspirin 81 MG tablet, Take 81 mg by mouth daily., Disp: , Rfl:    Blood Glucose Monitoring Suppl (ACCU-CHEK GUIDE ME) w/Device KIT, USE IN THE MORNING, AT NOON, AND AT BEDTIME., Disp: , Rfl:    Blood Glucose Monitoring Suppl (ACCU-CHEK GUIDE ME) w/Device KIT,  USE IN THE MORNING, AT NOON, AND AT BEDTIME., Disp: 1 kit, Rfl: 0   Cholecalciferol (VITAMIN D  PO), Take 25 mg by mouth., Disp: , Rfl:    Empagliflozin -Linaglip-Metform (TRIJARDY  XR) 25-03-999 MG TB24, Take 1 tablet by mouth daily., Disp: 90 tablet, Rfl: 1   gabapentin  (NEURONTIN ) 400 MG capsule, Take 1 capsule (400 mg total) by mouth at bedtime. TAKE 1 CAPSULE(400 MG) BY MOUTH AT BEDTIME, Disp: 90 capsule, Rfl: 1   lovastatin  (MEVACOR ) 20 MG tablet, TAKE 1 TABLET(20 MG) BY MOUTH AT BEDTIME, Disp: 90 tablet, Rfl: 1   pioglitazone  (ACTOS ) 15 MG tablet, Take 1 tablet (15 mg total) by mouth daily., Disp: 90 tablet, Rfl: 1  No Known Allergies  I personally reviewed active problem  list, medication list, allergies, family history with the patient/caregiver today.   ROS  Ten systems reviewed and is negative except as mentioned in HPI    Objective Physical Exam  CONSTITUTIONAL: Patient appears well-developed and well-nourished. No distress. HEENT: Head atraumatic, normocephalic, neck supple. CARDIOVASCULAR: Normal rate, regular rhythm and normal heart sounds. No murmur heard. No BLE edema. Feet dry, nails in good condition. PULMONARY: Effort normal and breath sounds normal. No respiratory distress. ABDOMINAL: There is no tenderness or distention. MUSCULOSKELETAL: Normal gait. Without gross motor or sensory deficit. PSYCHIATRIC: Patient has a normal mood and affect. Behavior is normal. Judgment and thought content normal.  Vitals:   09/16/24 0827  BP: 132/70  Pulse: 91  Resp: 16  SpO2: 99%  Weight: 143 lb (64.9 kg)  Height: 5' 5 (1.651 m)    Body mass index is 23.8 kg/m.  Recent Results (from the past 2160 hours)  Lab report - scanned     Status: None   Collection Time: 07/31/24  4:09 PM  Result Value Ref Range   Creatinine, POC 41 mg/dL   Albumin, Urine POC <9.7    Albumin/Creatinine Ratio, Urine, POC      Comment: LESS THAN 0.2 SO UNABLE TO CALCULATE   EGFR 70.0   POCT glycosylated hemoglobin (Hb A1C)     Status: None   Collection Time: 09/16/24  8:44 AM  Result Value Ref Range   Hemoglobin A1C 4.7 4.0 - 5.6 %   HbA1c POC (<> result, manual entry)     HbA1c, POC (prediabetic range)     HbA1c, POC (controlled diabetic range)      Diabetic Foot Exam:  Diabetic foot exam was performed with the following findings:   Normal sensation of 10g monofilament Intact posterior tibialis and dorsalis pedis pulses Dry skin      PHQ2/9:    09/16/2024    8:22 AM 05/16/2024    2:30 PM 01/10/2024    8:41 AM 08/15/2023    9:13 AM 06/20/2023   10:13 AM  Depression screen PHQ 2/9  Decreased Interest 0 0 0 0 0  Down, Depressed, Hopeless 0 0 0 0 0  PHQ  - 2 Score 0 0 0 0 0  Altered sleeping   0 0 0  Tired, decreased energy   0 0 0  Change in appetite   0 0 0  Feeling bad or failure about yourself    0 0 0  Trouble concentrating   0 0 0  Moving slowly or fidgety/restless   0 0 0  Suicidal thoughts   0 0 0  PHQ-9 Score   0 0 0  Difficult doing work/chores   Not difficult at all      phq  9 is negative  Fall Risk:    09/16/2024    8:22 AM 05/16/2024    2:30 PM 01/10/2024    8:41 AM 11/09/2023    9:39 AM 08/15/2023    9:12 AM  Fall Risk   Falls in the past year? 0 0 0 0 0  Number falls in past yr: 0 0 0 0   Injury with Fall? 0 0 0 0   Risk for fall due to : No Fall Risks No Fall Risks No Fall Risks No Fall Risks No Fall Risks  Follow up Falls evaluation completed Falls prevention discussed;Education provided;Falls evaluation completed Falls prevention discussed;Education provided;Falls evaluation completed Education provided;Falls evaluation completed;Falls prevention discussed Falls prevention discussed     Assessment & Plan Type 2 diabetes mellitus with diabetic polyneuropathy A1c decreased significantly from 7.5% to 4.7%, which is unexpectedly low. Well-controlled polyneuropathy with gabapentin . The drastic drop in A1c may not be accurate, requiring further investigation. - Discontinue metformin  750 mg , may take during holidays, and check A1C through lab work  - Scientist, research (medical) to take metformin  only on holidays or during festivities where dietary compliance may be challenging. - Order lab work to confirm A1c, CBC, comprehensive panel, B12, folate, and vitamin D  levels. - Continue gabapentin  for polyneuropathy. - Monitor for symptoms of hyperglycemia such as increased thirst or urination.  Dyslipidemia Associated with diabetes. Currently on Lovastatin  with no reported side effects. - Continue medication - Provide refills for Lovastatin   Carpal tunnel syndrome, right hand Well-controlled with gabapentin . - Continue gabapentin  for  carpal tunnel syndrome.  Vitamin D  deficiency Previously noted but has improved with over-the-counter vitamin D  supplements. - Check vitamin D  levels with lab work.

## 2024-09-17 ENCOUNTER — Other Ambulatory Visit: Payer: Self-pay | Admitting: Family Medicine

## 2024-09-17 ENCOUNTER — Ambulatory Visit: Payer: Self-pay | Admitting: Family Medicine

## 2024-09-17 LAB — LIPID PANEL
Cholesterol: 128 mg/dL (ref <200)
HDL: 55 mg/dL (ref >=40)
LDL Cholesterol (Calc): 56 mg/dL
Non-HDL Cholesterol (Calc): 73 mg/dL (ref <130)
Total CHOL/HDL Ratio: 2.3 (calc) (ref <5.0)
Triglycerides: 90 mg/dL (ref <150)

## 2024-09-17 LAB — COMPREHENSIVE METABOLIC PANEL WITH GFR
AG Ratio: 1.8 (calc) (ref 1.0–2.5)
ALT: 12 U/L (ref 9–46)
AST: 13 U/L (ref 10–35)
Albumin: 4.6 g/dL (ref 3.6–5.1)
Alkaline phosphatase (APISO): 54 U/L (ref 35–144)
BUN: 21 mg/dL (ref 7–25)
CO2: 26 mmol/L (ref 20–32)
Calcium: 10.3 mg/dL (ref 8.6–10.3)
Chloride: 103 mmol/L (ref 98–110)
Creat: 1.17 mg/dL (ref 0.70–1.35)
Globulin: 2.6 g/dL (ref 1.9–3.7)
Glucose, Bld: 219 mg/dL — ABNORMAL HIGH (ref 65–99)
Potassium: 4.7 mmol/L (ref 3.5–5.3)
Sodium: 140 mmol/L (ref 135–146)
Total Bilirubin: 0.2 mg/dL (ref 0.2–1.2)
Total Protein: 7.2 g/dL (ref 6.1–8.1)
eGFR: 68 mL/min/1.73m2 (ref >=60)

## 2024-09-17 LAB — MICROALBUMIN / CREATININE URINE RATIO
Creatinine, Urine: 43 mg/dL (ref 20–320)
Microalb, Ur: <0.2 mg/dL

## 2024-09-17 LAB — B12 AND FOLATE PANEL
Folate: >24 ng/mL
Vitamin B-12: 670 pg/mL (ref 200–1100)

## 2024-09-17 LAB — CBC WITH DIFFERENTIAL/PLATELET
Absolute Lymphocytes: 1329 {cells}/uL (ref 850–3900)
Absolute Monocytes: 469 {cells}/uL (ref 200–950)
Basophils Absolute: 18 {cells}/uL (ref 0–200)
Basophils Relative: 0.4 %
Eosinophils Absolute: 78 {cells}/uL (ref 15–500)
Eosinophils Relative: 1.7 %
HCT: 45.5 % (ref 38.5–50.0)
Hemoglobin: 15.1 g/dL (ref 13.2–17.1)
MCH: 30.4 pg (ref 27.0–33.0)
MCHC: 33.2 g/dL (ref 32.0–36.0)
MCV: 91.7 fL (ref 80.0–100.0)
MPV: 10.9 fL (ref 7.5–12.5)
Monocytes Relative: 10.2 %
Neutro Abs: 2705 {cells}/uL (ref 1500–7800)
Neutrophils Relative %: 58.8 %
Platelets: 237 Thousand/uL (ref 140–400)
RBC: 4.96 Million/uL (ref 4.20–5.80)
RDW: 13 % (ref 11.0–15.0)
Total Lymphocyte: 28.9 %
WBC: 4.6 Thousand/uL (ref 3.8–10.8)

## 2024-09-17 LAB — VITAMIN D 25 HYDROXY (VIT D DEFICIENCY, FRACTURES): Vit D, 25-Hydroxy: 55 ng/mL (ref 30–100)

## 2024-09-17 LAB — HEMOGLOBIN A1C
Hgb A1c MFr Bld: 6.6 % — ABNORMAL HIGH (ref <5.7)
Mean Plasma Glucose: 143 mg/dL
eAG (mmol/L): 7.9 mmol/L

## 2024-09-17 MED ORDER — METFORMIN HCL ER 500 MG PO TB24: 500.0000 mg | ORAL_TABLET | Freq: Every day | ORAL | 1 refills | Status: AC

## 2024-10-31 ENCOUNTER — Ambulatory Visit

## 2024-10-31 DIAGNOSIS — Z0000 Encounter for general adult medical examination without abnormal findings: Secondary | ICD-10-CM | POA: Diagnosis not present

## 2024-10-31 NOTE — Progress Notes (Signed)
 I connected with  John Howard on 10/31/24 by a audio enabled telemedicine application and verified that I am speaking with the correct person using two identifiers.  Patient Location: Home  Provider Location: Office/Clinic  Persons Participating in Visit: Patient.  I discussed the limitations of evaluation and management by telemedicine. The patient expressed understanding and agreed to proceed.   Vital Signs: Because this visit was a virtual/telehealth visit, some criteria may be missing or patient reported. Any vitals not documented were not able to be obtained and vitals that have been documented are patient reported.      No chief complaint on file.    Subjective:   John Howard is a 68 y.o. male who presents for a Medicare Annual Wellness Visit.  Fall Screening Falls in the past year?: 0 Number of falls in past year: 0 Was there an injury with Fall?: 0 Fall Risk Category Calculator: 0 Patient Fall Risk Level: Low Fall Risk  Fall Risk Patient at Risk for Falls Due to: No Fall Risks Fall risk Follow up: Falls evaluation completed    Allergies (verified) Patient has no known allergies.   Current Medications (verified) Outpatient Encounter Medications as of 10/31/2024  Medication Sig   ACCU-CHEK GUIDE TEST test strip 1 EACH BY IN VITRO ROUTE 2 (TWO) TIMES DAILY.   Accu-Chek Softclix Lancets lancets 2 (two) times daily. as directed   aspirin 81 MG tablet Take 81 mg by mouth daily.   Blood Glucose Monitoring Suppl (ACCU-CHEK GUIDE ME) w/Device KIT USE IN THE MORNING, AT NOON, AND AT BEDTIME.   Blood Glucose Monitoring Suppl (ACCU-CHEK GUIDE ME) w/Device KIT USE IN THE MORNING, AT NOON, AND AT BEDTIME.   Cholecalciferol (VITAMIN D  PO) Take 25 mg by mouth.   Empagliflozin -Linaglip-Metform (TRIJARDY  XR) 25-03-999 MG TB24 Take 1 tablet by mouth daily.   gabapentin  (NEURONTIN ) 400 MG capsule Take 1 capsule (400 mg total) by mouth at bedtime. TAKE 1 CAPSULE(400 MG)  BY MOUTH AT BEDTIME   lovastatin  (MEVACOR ) 20 MG tablet TAKE 1 TABLET(20 MG) BY MOUTH AT BEDTIME   metFORMIN  (GLUCOPHAGE -XR) 500 MG 24 hr tablet Take 1 tablet (500 mg total) by mouth daily with breakfast.   pioglitazone  (ACTOS ) 15 MG tablet Take 1 tablet (15 mg total) by mouth daily.   No facility-administered encounter medications on file as of 10/31/2024.    History: Past Medical History:  Diagnosis Date   Arthritis    fingers   Diabetes mellitus without complication (HCC)    Hyperlipidemia    Hypertension    Thoracic compression fracture (HCC) 12/05/2018   Xray Jan 2020   Wears dentures    full upper and lower   Past Surgical History:  Procedure Laterality Date   COLONOSCOPY     COLONOSCOPY WITH PROPOFOL  N/A 03/21/2016   Procedure: COLONOSCOPY WITH PROPOFOL ;  Surgeon: Rogelia Copping, MD;  Location: [facility];  Service: Endoscopy;  Laterality: N/A;  Diabetic - insulin    EYE SURGERY     Family History  Problem Relation Age of Onset   Hypertension Mother    Diabetes Mother    Diabetes Maternal Grandmother    Heart attack Maternal Grandfather    Social History   Occupational History   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   Tobacco Use   Smoking status: Some Days    Current packs/day: 0.00    Average packs/day: 0.2 packs/day for 44.2 years (11.0 ttl pk-yrs)    Types: Cigarettes    Start date: 11/28/1974  Last attempt to quit: 02/07/2019    Years since quitting: 5.7   Smokeless tobacco: Never   Tobacco comments:    he has been smoking a few times a week again  Vaping Use   Vaping status: Never Used  Substance and Sexual Activity   Alcohol use: Not Currently    Comment: pt is trying to quit, has not had a drink in a month   Drug use: No   Sexual activity: Yes    Partners: Female    Birth control/protection: None   Tobacco Counseling Ready to quit: Not Answered Counseling given: Not Answered Tobacco comments: he has been smoking a few times a week again  SDOH  Screenings   Food Insecurity: No Food Insecurity (06/20/2023)  Housing: Low Risk  (06/20/2023)  Transportation Needs: No Transportation Needs (06/20/2023)  Utilities: Not At Risk (06/20/2023)  Alcohol Screen: Low Risk  (12/02/2020)  Depression (PHQ2-9): Low Risk  (09/16/2024)  Financial Resource Strain: Low Risk  (06/20/2023)  Physical Activity: Unknown (06/20/2023)  Social Connections: Moderately Isolated (06/20/2023)  Stress: No Stress Concern Present (06/20/2023)  Tobacco Use: High Risk (09/16/2024)  Health Literacy: Adequate Health Literacy (06/20/2023)   See flowsheets for full screening details  Depression Screen PHQ 2 & 9 Depression Scale- Over the past 2 weeks, how often have you been bothered by any of the following problems? Little interest or pleasure in doing things: 0 Feeling down, depressed, or hopeless (PHQ Adolescent also includes...irritable): 0 PHQ-2 Total Score: 0 Trouble falling or staying asleep, or sleeping too much: 0 Feeling tired or having little energy: 0 Poor appetite or overeating (PHQ Adolescent also includes...weight loss): 0 Feeling bad about yourself - or that you are a failure or have let yourself or your family down: 0 Trouble concentrating on things, such as reading the newspaper or watching television (PHQ Adolescent also includes...like school work): 0 Moving or speaking so slowly that other people could have noticed. Or the opposite - being so fidgety or restless that you have been moving around a lot more than usual: 0 Thoughts that you would be better off dead, or of hurting yourself in some way: 0 PHQ-9 Total Score: 0 If you checked off any problems, how difficult have these problems made it for you to do your work, take care of things at home, or get along with other people?: Not difficult at all     Goals Addressed   None          Objective:    There were no vitals filed for this visit. There is no height or weight on file to calculate  BMI.  Hearing/Vision screen No results found. Immunizations and Health Maintenance Health Maintenance  Topic Date Due   Medicare Annual Wellness (AWV)  06/19/2024   COVID-19 Vaccine (4 - 2025-26 season) 07/29/2024   OPHTHALMOLOGY EXAM  01/07/2025   HEMOGLOBIN A1C  03/17/2025   Diabetic kidney evaluation - eGFR measurement  09/16/2025   Diabetic kidney evaluation - Urine ACR  09/16/2025   FOOT EXAM  09/16/2025   Colonoscopy  03/21/2026   DTaP/Tdap/Td (2 - Td or Tdap) 12/05/2028   Pneumococcal Vaccine: 50+ Years  Completed   Influenza Vaccine  Completed   Hepatitis C Screening  Completed   Zoster Vaccines- Shingrix   Completed   Meningococcal B Vaccine  Aged Out        Assessment/Plan:  This is a routine wellness examination for John Howard.  Patient Care Team: Sowles, Krichna, MD as PCP -  General (Family Medicine) French Mering, MD as Referring Physician (Internal Medicine) Alana, Sharyle LABOR, RPH-CPP as Pharmacist  I have personally reviewed and noted the following in the patient's chart:   Medical and social history Use of alcohol, tobacco or illicit drugs  Current medications and supplements including opioid prescriptions. Functional ability and status Nutritional status Physical activity Advanced directives List of other physicians Hospitalizations, surgeries, and ER visits in previous 12 months Vitals Screenings to include cognitive, depression, and falls Referrals and appointments  No orders of the defined types were placed in this encounter.  In addition, I have reviewed and discussed with patient certain preventive protocols, quality metrics, and best practice recommendations. A written personalized care plan for preventive services as well as general preventive health recommendations were provided to patient.   John GORMAN Das, LPN   [id number]   No follow-ups on file.  After Visit Summary: (MyChart) Due to this being a telephonic visit, the after  visit summary with patients personalized plan was offered to patient via MyChart   Nurse Notes: UTD ON SHOTS EXCEPT COVID; UTD ON COLONOSCOPY

## 2024-10-31 NOTE — Patient Instructions (Addendum)
 [name],  Thank you for taking the time for your Medicare Wellness Visit. I appreciate your continued commitment to your health goals. Please review the care plan we discussed, and feel free to reach out if I can assist you further.  Please note that Annual Wellness Visits do not include a physical exam. Some assessments may be limited, especially if the visit was conducted virtually. If needed, we may recommend an in-person follow-up with your provider.  Ongoing Care Seeing your primary care provider every 3 to 6 months helps us  monitor your health and provide consistent, personalized care.   Referrals If a referral was made during today's visit and you haven't received any updates within two weeks, please contact the referred provider directly to check on the status.  Recommended Screenings:  Health Maintenance  Topic Date Due   COVID-19 Vaccine (4 - 2025-26 season) 07/29/2024   Eye exam for diabetics  01/07/2025   Hemoglobin A1C  03/17/2025   Yearly kidney function blood test for diabetes  09/16/2025   Yearly kidney health urinalysis for diabetes  09/16/2025   Complete foot exam   09/16/2025   Medicare Annual Wellness Visit  10/31/2025   Colon Cancer Screening  03/21/2026   DTaP/Tdap/Td vaccine (2 - Td or Tdap) 12/05/2028   Pneumococcal Vaccine for age over 40  Completed   Flu Shot  Completed   Hepatitis C Screening  Completed   Zoster (Shingles) Vaccine  Completed   Meningitis B Vaccine  Aged Out     Vision: Annual vision screenings are recommended for early detection of glaucoma, cataracts, and diabetic retinopathy. These exams can also reveal signs of chronic conditions such as diabetes and high blood pressure.  Dental: Annual dental screenings help detect early signs of oral cancer, gum disease, and other conditions linked to overall health, including heart disease and diabetes.  Please see the attached documents for additional preventive care recommendations.   NEXT  AWV 11/06/25 @ 9:30 IN PERSON

## 2024-12-12 ENCOUNTER — Other Ambulatory Visit: Payer: Self-pay | Admitting: Family Medicine

## 2024-12-12 DIAGNOSIS — E1142 Type 2 diabetes mellitus with diabetic polyneuropathy: Secondary | ICD-10-CM

## 2024-12-13 NOTE — Telephone Encounter (Signed)
 Requested Prescriptions  Pending Prescriptions Disp Refills   ACCU-CHEK GUIDE TEST test strip [Pharmacy Med Name: ACCU-CHEK GUIDE TEST STRIP] 100 strip 1    Sig: 1 EACH BY IN VITRO ROUTE 2 (TWO) TIMES DAILY.     There is no refill protocol information for this order

## 2025-01-20 ENCOUNTER — Ambulatory Visit: Admitting: Family Medicine

## 2025-11-06 ENCOUNTER — Ambulatory Visit
